# Patient Record
Sex: Male | Born: 1955 | Race: Black or African American | Hispanic: No | Marital: Single | Smoking: Former smoker
Health system: Southern US, Community
[De-identification: ages and names within clinical notes are randomized; demographics above are authoritative.]

## PROBLEM LIST (undated history)

## (undated) DIAGNOSIS — M51369 Other intervertebral disc degeneration, lumbar region without mention of lumbar back pain or lower extremity pain: Secondary | ICD-10-CM

## (undated) DIAGNOSIS — C189 Malignant neoplasm of colon, unspecified: Secondary | ICD-10-CM

## (undated) DIAGNOSIS — F10929 Alcohol use, unspecified with intoxication, unspecified: Secondary | ICD-10-CM

## (undated) DIAGNOSIS — M5416 Radiculopathy, lumbar region: Secondary | ICD-10-CM

## (undated) DIAGNOSIS — M5136 Other intervertebral disc degeneration, lumbar region: Secondary | ICD-10-CM

## (undated) HISTORY — DX: Radiculopathy, lumbar region: M54.16

## (undated) HISTORY — DX: Other intervertebral disc degeneration, lumbar region without mention of lumbar back pain or lower extremity pain: M51.369

## (undated) HISTORY — DX: Other intervertebral disc degeneration, lumbar region: M51.36

## (undated) HISTORY — DX: Malignant neoplasm of colon, unspecified: C18.9

## (undated) HISTORY — PX: OTHER SURGICAL HISTORY: SHX169

---

## 2005-08-06 ENCOUNTER — Emergency Department (HOSPITAL_COMMUNITY): Admission: EM | Admit: 2005-08-06 | Discharge: 2005-08-07 | Payer: Self-pay | Admitting: Emergency Medicine

## 2006-08-31 ENCOUNTER — Emergency Department (HOSPITAL_COMMUNITY): Admission: EM | Admit: 2006-08-31 | Discharge: 2006-09-01 | Payer: Self-pay | Admitting: *Deleted

## 2008-01-01 IMAGING — CT CT HEAD W/O CM
1 series · 16 of 30 positions shown, 20 images · IV contrast (agent unspecified)
Comparison: 08/07/05.

CLINICAL DATA: Intoxication and laceration. 
 HEAD CT WITHOUT CONTRAST:
TECHNIQUE: Contiguous axial images were obtained from the base of the skull through the vertex according to standard protocol without contrast.

[Series 2: head routine 4.8 h47s · axial · 0.47mm/px · z∈[-169,-39]mm · 16 of 30 slices shown, 20 images]
[im 2/30  brain]
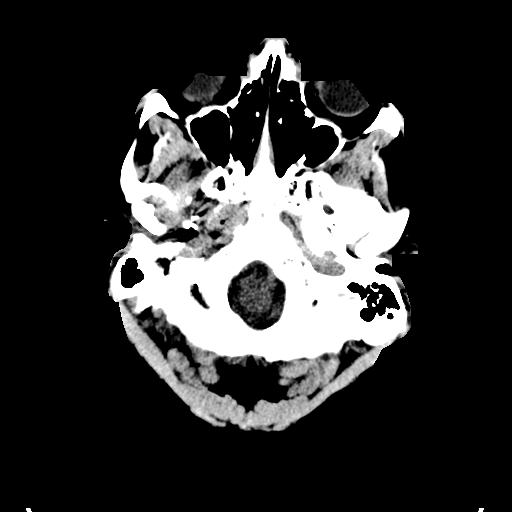
[im 2/30  bone]
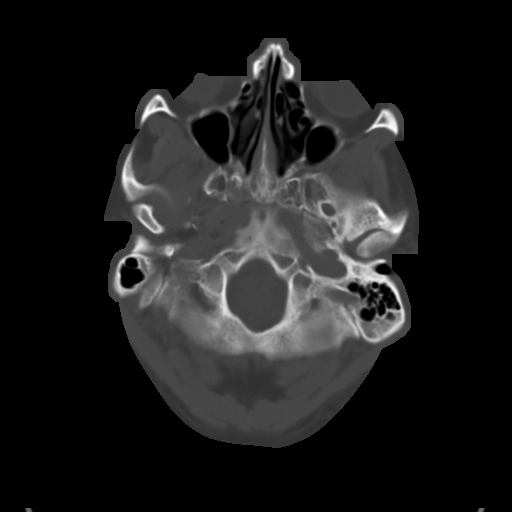
[im 4/30  brain]
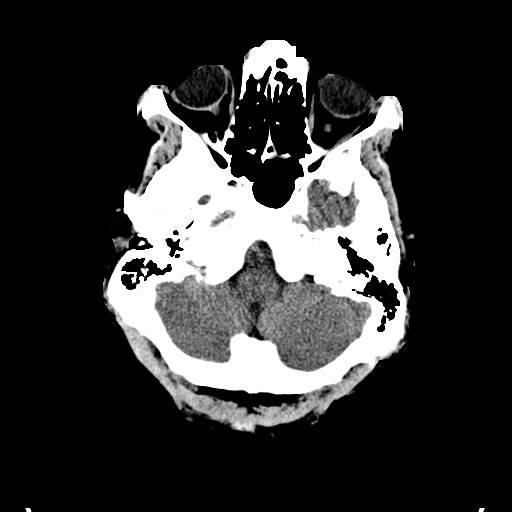
[im 6/30  brain]
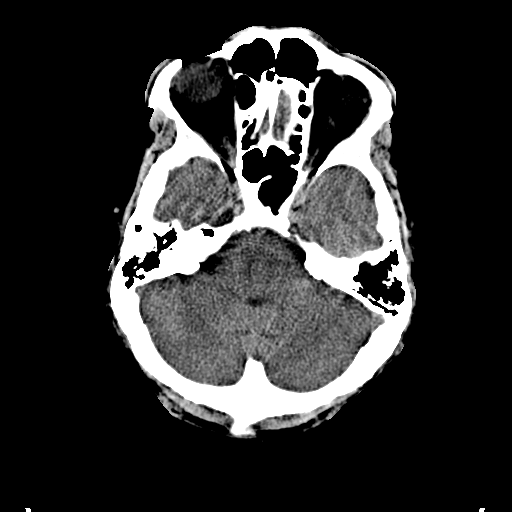
[im 8/30  brain]
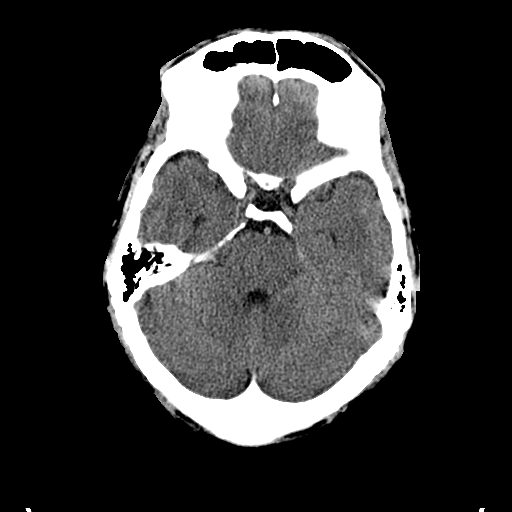
[im 9/30  brain]
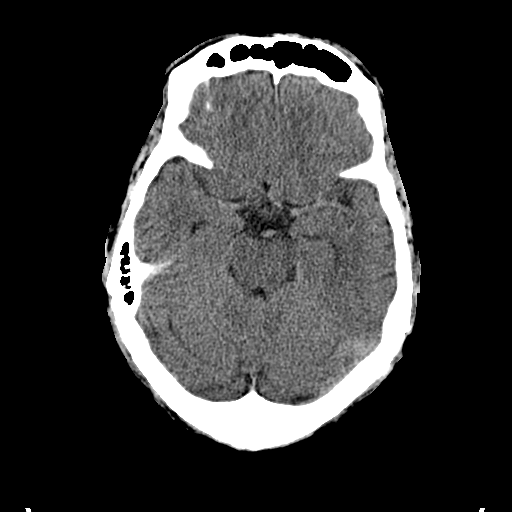
[im 9/30  bone]
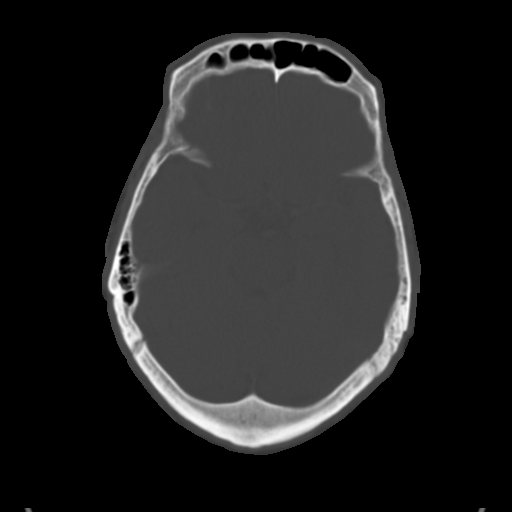
[im 11/30  brain]
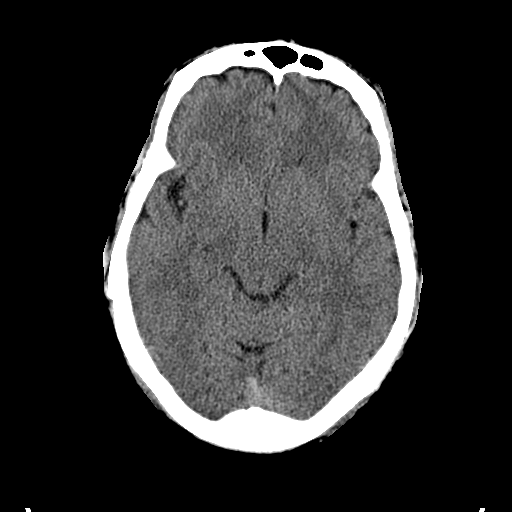
[im 13/30  brain]
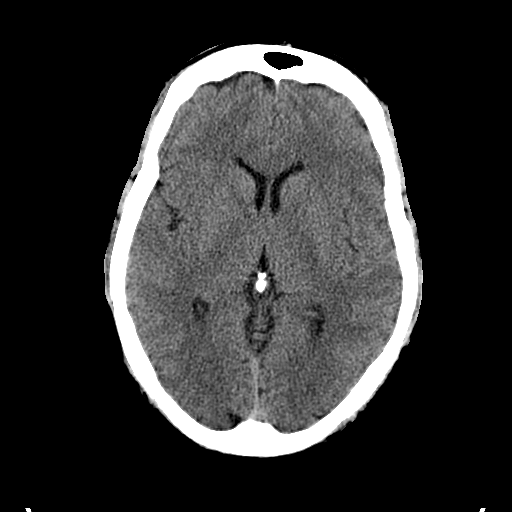
[im 15/30  brain]
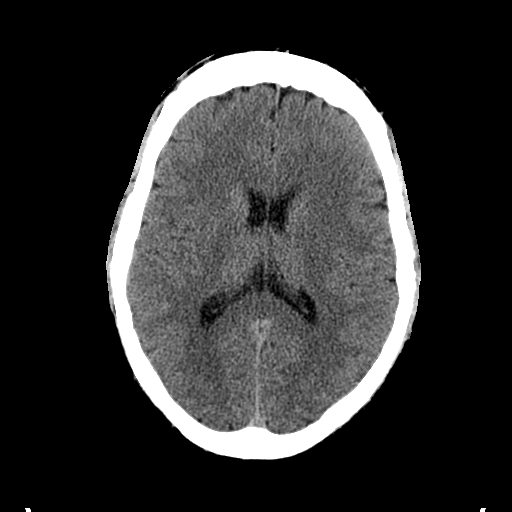
[im 16/30  brain]
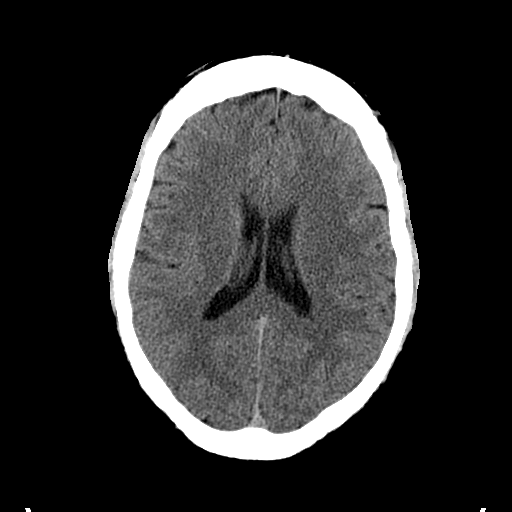
[im 16/30  bone]
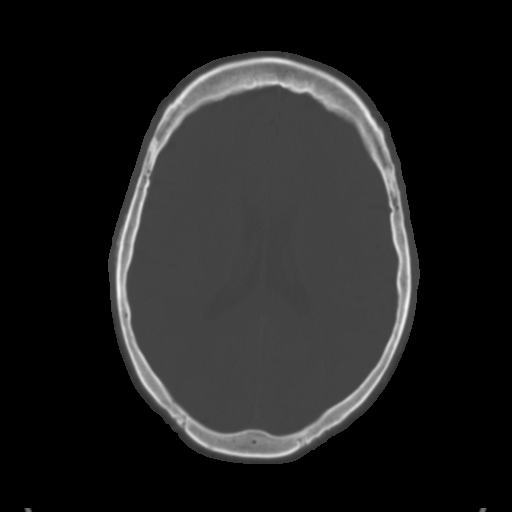
[im 18/30  brain]
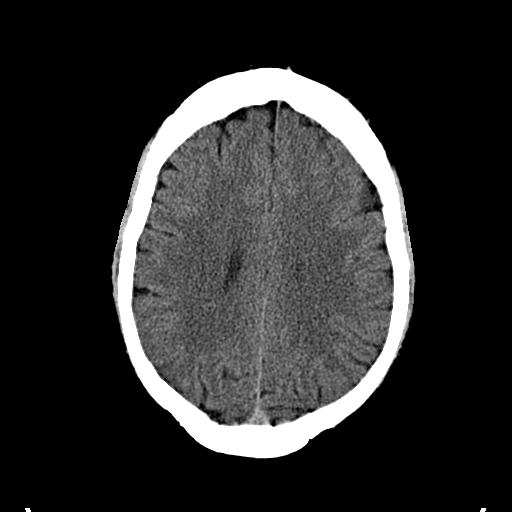
[im 20/30  brain]
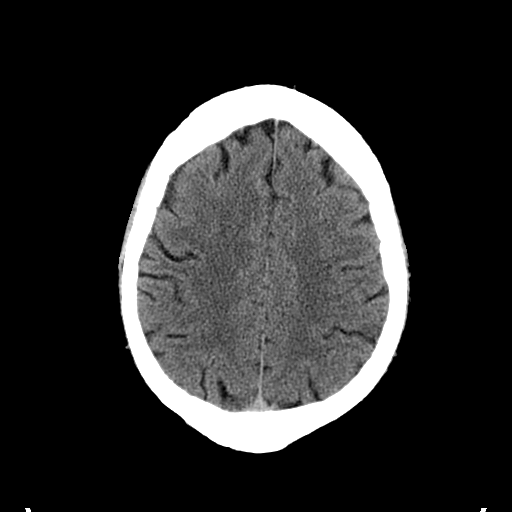
[im 22/30  brain]
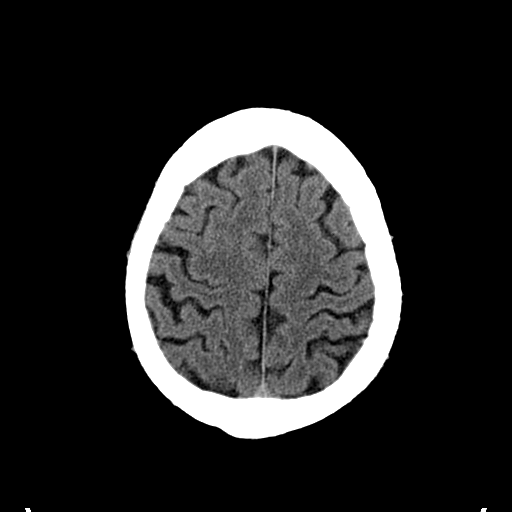
[im 23/30  brain]
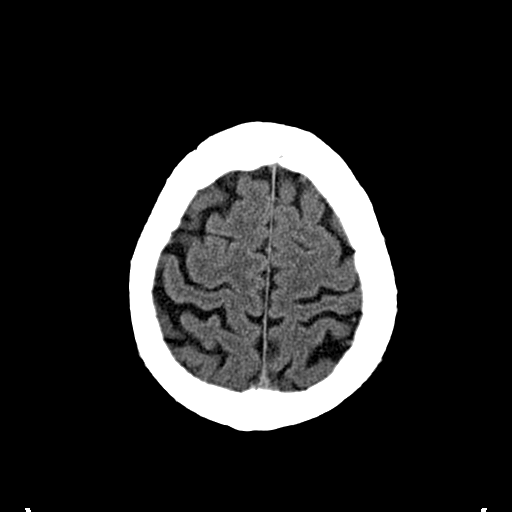
[im 23/30  bone]
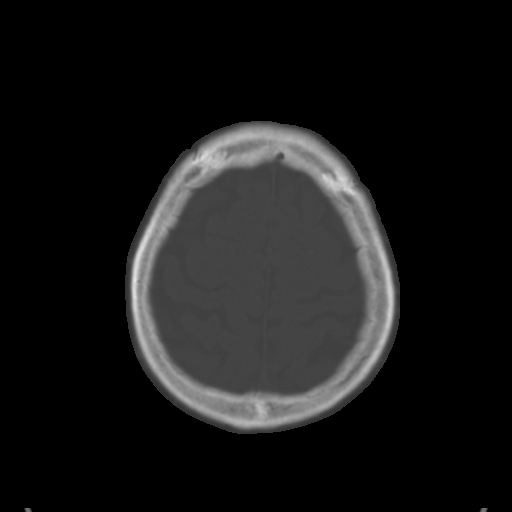
[im 25/30  brain]
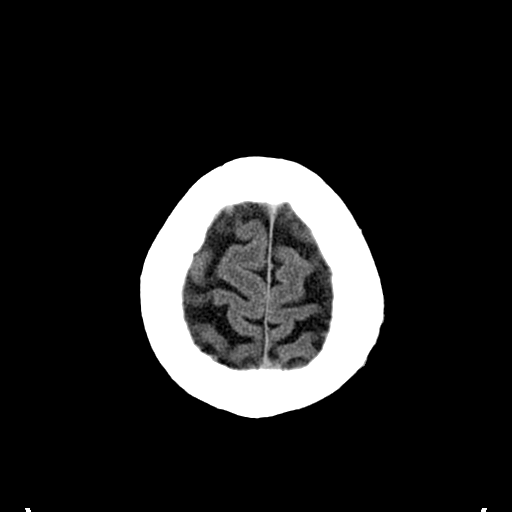
[im 27/30  brain]
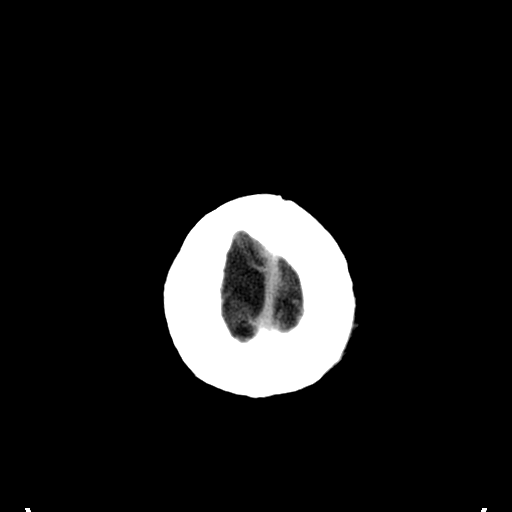
[im 29/30  brain]
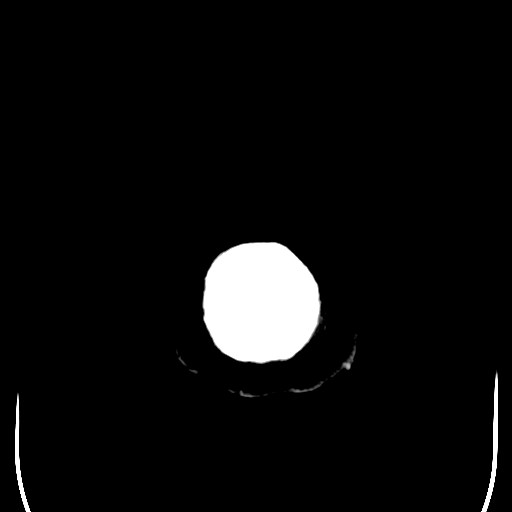

[16 of 30 positions shown; findings below may reference images not displayed]

FINDINGS: Intracranial structures are negative for hemorrhage, mass lesion, midline shift or hydrocephalus.  The ventricles and basilar cisterns are within normal limits.  Visualized sinuses are clear.  No acute bony abnormalities.
IMPRESSION: No acute intracranial abnormalities.

## 2018-05-23 ENCOUNTER — Emergency Department (HOSPITAL_COMMUNITY): Payer: Self-pay

## 2018-05-23 ENCOUNTER — Other Ambulatory Visit: Payer: Self-pay

## 2018-05-23 ENCOUNTER — Emergency Department (HOSPITAL_COMMUNITY)
Admission: EM | Admit: 2018-05-23 | Discharge: 2018-05-24 | Discharge status: Against Medical Advice | Payer: Self-pay | Attending: Emergency Medicine | Admitting: Emergency Medicine

## 2018-05-23 DIAGNOSIS — F10920 Alcohol use, unspecified with intoxication, uncomplicated: Secondary | ICD-10-CM | POA: Insufficient documentation

## 2018-05-23 DIAGNOSIS — W1830XA Fall on same level, unspecified, initial encounter: Secondary | ICD-10-CM | POA: Insufficient documentation

## 2018-05-23 DIAGNOSIS — M25551 Pain in right hip: Secondary | ICD-10-CM | POA: Insufficient documentation

## 2018-05-23 DIAGNOSIS — M79604 Pain in right leg: Secondary | ICD-10-CM

## 2018-05-23 NOTE — ED Notes (Signed)
Pt able to walk to restroom with 2 person assist.

## 2018-05-23 NOTE — ED Triage Notes (Signed)
Per EMS pt was picked up at a [employer] after the owner called [employer] because pt urinated inside the establishment. Per EMS pt c/o right leg/hip pain, sts was unable to walk. EMS sts [employer] reported pt was intoxicated, however pt denies drinking. Per EMS pt is agitated, angry, refused to answer their questions.

## 2018-05-23 NOTE — ED Notes (Signed)
Pt will not answer any questions and keeps shouting " My leg hurts." When left alone pt will go to sleep and start snoring.

## 2018-05-23 NOTE — ED Notes (Signed)
Pt was consolable after talking to him while getting vital signs. He was agitated that the police was called on him.

## 2018-05-23 NOTE — ED Notes (Signed)
Unable to obtain past medical history from pt, he does not want to answer questions. Pt only states that he is angry that police was called on him and that his leg is hurting.

## 2018-05-23 NOTE — ED Notes (Signed)
Pt able to ambulate unassisted from the restroom to his stretcher.

## 2018-05-24 MED ORDER — IBUPROFEN 200 MG PO TABS
600.0000 mg | ORAL_TABLET | Freq: Once | ORAL | Status: AC
  Administered 2018-05-24: 600 mg via ORAL
  Filled 2018-05-24: qty 3

## 2018-05-24 NOTE — ED Provider Notes (Signed)
[facility] DEPT Provider Note  CSN: [id number] Arrival date & time: 05/23/18 2147  Chief Complaint(s) Leg Pain  HPI Carl Barr is a 62 y.o. male who was brought in by EMS after being picked up at a restaurant for intoxication.  Patient at that time was found standing outside of the property.  [employer] was called the patient complained of right hip pain this EMS was contacted.  At that time patient was agitated and angry and refusing to answer questions.  Here he is complaining of right hip pain exacerbated with ambulation and range of motion.  Reports that he fell while getting down of the bus.  That occurred a few hours prior to being picked up.  Denies any head trauma or loss of consciousness.  Denied any other physical complaints including neck pain, back pain, upper extremity pain, chest pain, abdominal pain or left lower extremity pain.    HPI  Past Medical History No past medical history on file. There are no active problems to display for this patient.  Home Medication(s) Prior to Admission medications   Not on File                                                                                                                                    Past Surgical History ** The histories are not reviewed yet. Please review them in the "History" navigator section and refresh this [place]. Family History No family history on file.  Social History Social History   Tobacco Use  . Smoking status: Not on file  Substance Use Topics  . Alcohol use: Not on file  . Drug use: Not on file   Allergies Patient has no allergy information on record.  Review of Systems Review of Systems All other systems are reviewed and are negative for acute change except as noted in the HPI  Physical Exam Vital Signs  I have reviewed the triage vital signs BP (!) 149/66 (BP Location: Right Arm)   Pulse 91   Temp 98.1 F (36.7 C) (Oral)   Resp 16   SpO2 95%    Physical Exam  Constitutional: He is oriented to person, place, and time. He appears well-developed and well-nourished. No distress.  Clinically intoxicated  HENT:  Head: Normocephalic.  Right Ear: External ear normal.  Left Ear: External ear normal.  Mouth/Throat: Oropharynx is clear and moist.  Eyes: Pupils are equal, round, and reactive to light. Conjunctivae and EOM are normal. Right eye exhibits no discharge. Left eye exhibits no discharge. No scleral icterus.  Neck: Normal range of motion. Neck supple.  Cardiovascular: Regular rhythm and normal heart sounds. Exam reveals no gallop and no friction rub.  No murmur heard. Pulses:      Radial pulses are 2+ on the right side, and 2+ on the left side.       Dorsalis pedis pulses are 2+ on the right side,  and 2+ on the left side.  Pulmonary/Chest: Effort normal and breath sounds normal. No stridor. No respiratory distress.  Abdominal: Soft. He exhibits no distension. There is no tenderness.  Musculoskeletal:       Right hip: He exhibits tenderness. He exhibits normal range of motion, no swelling and no deformity.       Cervical back: He exhibits no bony tenderness.       Thoracic back: He exhibits no bony tenderness.       Lumbar back: He exhibits no bony tenderness.  Clavicle stable. Chest stable to AP/Lat compression. Pelvis stable to Lat compression. No obvious extremity deformity. No chest or abdominal wall contusion.  Neurological: He is alert and oriented to person, place, and time. GCS eye subscore is 4. GCS verbal subscore is 5. GCS motor subscore is 6.  Moving all extremities   Skin: Skin is warm. He is not diaphoretic.    ED Results and Treatments Labs (all labs ordered are listed, but only abnormal results are displayed) Labs Reviewed - No data to display                                                                                                                       EKG  EKG Interpretation  Date/Time:     Ventricular Rate:    PR Interval:    QRS Duration:   QT Interval:    QTC Calculation:   R Axis:     Text Interpretation:        Radiology Dg Hip Unilat W Or Wo Pelvis 1 View Right  Result Date: 05/23/2018 CLINICAL DATA:  Right hip pain EXAM: DG HIP (WITH OR WITHOUT PELVIS) 1V RIGHT COMPARISON:  None. FINDINGS: There is no evidence of hip fracture or dislocation. There is no evidence of arthropathy or other focal bone abnormality. IMPRESSION: No acute osseous injury of the right hip. Electronically Signed   By: Kathreen Devoid   On: 05/23/2018 23:47   Pertinent labs & imaging results that were available during my care of the patient were reviewed by me and considered in my medical decision making (see chart for details).  Medications Ordered in ED Medications  ibuprofen (ADVIL,MOTRIN) tablet 600 mg (600 mg Oral Given 05/24/18 0341)  Procedures Procedures  (including critical care time)  Medical Decision Making / ED Course I have reviewed the nursing notes for this encounter and the patient's prior records (if available in EHR or on provided paperwork).    Intoxicated patient endorsing right hip pain following a reported fall.  Plain film negative.  Patient ambulation antalgic but not unsteady.  Patient was allowed to metabolize to freedom.  After which she was able to ambulate unassisted.  The patient appears reasonably screened and/or stabilized for discharge and I doubt any other medical condition or other [facility] requiring further screening, evaluation, or treatment in the ED at this time prior to discharge.  The patient is safe for discharge with strict return precautions.  Final Clinical Impression(s) / ED Diagnoses Final diagnoses:  Right leg pain  Alcoholic intoxication without complication ([place])    Disposition: Discharge  Condition: Good  I  have discussed the results, Dx and Tx plan with the patient who expressed understanding and agree(s) with the plan. Discharge instructions discussed at great length. The patient was given strict return precautions who verbalized understanding of the instructions. No further questions at time of discharge.    ED Discharge Orders    None       Follow Up: Primary care provider  Schedule an appointment as soon as possible for a visit  If symptoms do not improve or  worsen     This chart was dictated using voice recognition software.  Despite best efforts to proofread,  errors can occur which can change the documentation meaning.   Fatima Blank, MD 05/24/18 0830

## 2018-05-24 NOTE — Discharge Instructions (Signed)
You may use over-the-counter Motrin (Ibuprofen), Acetaminophen (Tylenol), topical muscle creams such as SalonPas, [employer], Bengay, etc. Please stretch, apply ice or heat, whichever helps.

## 2018-05-28 ENCOUNTER — Other Ambulatory Visit: Payer: Self-pay

## 2018-05-28 ENCOUNTER — Emergency Department (HOSPITAL_COMMUNITY): Payer: Self-pay

## 2018-05-28 ENCOUNTER — Emergency Department (HOSPITAL_COMMUNITY)
Admission: EM | Admit: 2018-05-28 | Discharge: 2018-05-28 | Disposition: A | Discharge status: Home / Self Care | Payer: Self-pay | Attending: Emergency Medicine | Admitting: Emergency Medicine

## 2018-05-28 ENCOUNTER — Encounter (HOSPITAL_COMMUNITY): Payer: Self-pay

## 2018-05-28 DIAGNOSIS — F17200 Nicotine dependence, unspecified, uncomplicated: Secondary | ICD-10-CM | POA: Insufficient documentation

## 2018-05-28 DIAGNOSIS — M79604 Pain in right leg: Secondary | ICD-10-CM | POA: Insufficient documentation

## 2018-05-28 HISTORY — DX: Alcohol use, unspecified with intoxication, unspecified: F10.929

## 2018-05-28 MED ORDER — DICLOFENAC SODIUM 1 % TD GEL: 2.0000 g | Freq: Four times a day (QID) | TRANSDERMAL | 0 refills | Status: AC

## 2018-05-28 MED ORDER — HYDROCODONE-ACETAMINOPHEN 5-325 MG PO TABS
2.0000 | ORAL_TABLET | Freq: Once | ORAL | Status: AC
Start: 2018-05-28 — End: 2018-05-28
  Administered 2018-05-28: 2 via ORAL
  Filled 2018-05-28: qty 2

## 2018-05-28 MED ORDER — ACETAMINOPHEN 325 MG PO TABS: 650.0000 mg | ORAL_TABLET | Freq: Once | ORAL | Status: DC

## 2018-05-28 NOTE — ED Notes (Signed)
ED Provider at bedside. 

## 2018-05-28 NOTE — ED Notes (Signed)
Patient verbalizes understanding of discharge instructions. Opportunity for questioning and answers were provided. Armband removed by staff, pt discharged from ED home with family via [place].

## 2018-05-28 NOTE — ED Triage Notes (Signed)
Pt arrives ambulatory via POV for eval of ongoing RLE pain. Pt was seen on 11/16 at [facility] for same and ETOH intox. Pt was advised to f/u w/ PCP for pain but states he did not. Pt reports Pain radiates from buttock down entirety of RLE. Denies injury/trauma

## 2018-05-28 NOTE — ED Provider Notes (Signed)
[facility] EMERGENCY DEPARTMENT Provider Note   CSN: [id number] Arrival date & time: 05/28/18  1611     History   Chief Complaint Chief Complaint  Patient presents with  . Leg Pain    HPI Carl Barr is a 62 y.o. male past history of alcohol intoxication who presents for evaluation of persistent right lower extremity pain.  Patient reports that he was seen in the muscle in ED on 05/24/2018 for evaluation of right lower leg pain.  At the time, he had been intoxicated and had sustained a fall.  His x-ray was negative.  Patient was able to be discharged home.  He reports that since then, he has had pain to the right leg.  He states most of his pain is in the right hip and extends into the right thigh.  He states most of the tenderness is related into the mid right thigh area.  He denies any new trauma, fall, injury.  He reports he has been able to ambulate but does report worsening pain with ambulation.  He has not been taking any medications for the pain.  Patient denies any back pain, overlying warmth, erythema, numbness/weakness of the leg, saddle anesthesia, urinary incontinence.  The history is provided by the patient.    Past Medical History:  Diagnosis Date  . Alcohol intoxication ([place])     There are no active problems to display for this patient.   History reviewed. No pertinent surgical history.      Home Medications    Prior to Admission medications   Medication Sig Start Date End Date Taking? Authorizing Provider  diclofenac sodium (VOLTAREN) 1 % GEL Apply 2 g topically 4 (four) times daily for 13 days. 05/28/18 06/10/18  Volanda Napoleon, PA-C    Family History History reviewed. No pertinent family history.  Social History Social History   Tobacco Use  . Smoking status: Current Every Day Smoker  . Smokeless tobacco: Never Used  Substance Use Topics  . Alcohol use: Yes    Comment: "alot"  . Drug use: Not Currently     Allergies    Patient has no known allergies.   Review of Systems Review of Systems  Musculoskeletal: Negative for back pain.       Right leg pain  Skin: Negative for color change.  Neurological: Negative for weakness and numbness.     Physical Exam Updated Vital Signs BP (!) 158/91   Pulse 86   Temp 98.6 F (37 C) (Oral)   Resp 16   Ht 5\' 9"  (1.753 m)   Wt 99.8 kg   SpO2 99%   BMI 32.49 kg/m   Physical Exam  Constitutional: He appears well-developed and well-nourished.  HENT:  Head: Normocephalic and atraumatic.  Eyes: Conjunctivae and EOM are normal. Right eye exhibits no discharge. Left eye exhibits no discharge. No scleral icterus.  Cardiovascular:  Pulses:      Radial pulses are 2+ on the right side, and 2+ on the left side.       Dorsalis pedis pulses are 2+ on the right side, and 2+ on the left side.  Pulmonary/Chest: Effort normal.  Musculoskeletal:       Legs: Diffuse muscular tenderness noted to the right lower extremity at the mid thigh area.  Extends superiorly up into the right hip.  No deformity or crepitus noted to the actual right hip bone.  Flexion/extension of right lower extremity intact.  Internal and external rotation able to be  accomplished without any difficulty.  Patient able to raise his leg but does report worsening pain in the thigh area when raising it.  He is able to hold it up against gravity.  No tenderness palpation noted right knee, right tib-fib.  No tenderness palpation on left lower extremity.  Bilateral lower extremities are symmetric in appearance without any overlying warmth, erythema, edema.  Neurological: He is alert.  Follows commands, Moves all extremities  5/5 strength to BUE and BLE  Sensation intact throughout all major nerve distributions  Skin: Skin is warm and dry. Capillary refill takes less than 2 seconds.  Good distal cap refill.  RLE is not dusky in appearance or cool to touch.  Psychiatric: He has a normal mood and affect. His  speech is normal and behavior is normal.  Nursing note and vitals reviewed.    ED Treatments / Results  Labs (all labs ordered are listed, but only abnormal results are displayed) Labs Reviewed - No data to display  EKG None  Radiology Dg Hip Unilat W Or Wo Pelvis 2-3 Views Right  Result Date: 05/28/2018 CLINICAL DATA:  Patient stepped off the bus 1 week ago and his right leg gave out resulting in a fall. Patient complains of joint pain radiating down right leg. 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 There appears to be lumbosacral transitional vertebral anatomy at the lumbosacral junction. The bony pelvis appears intact. No acute fracture of either hip. No pelvic diastasis. No flattening of the femoral heads. IMPRESSION: No acute osseous abnormality of the bony pelvis and right hip. 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 Electronically Signed   By: Ashley Royalty M.D.   On: 05/28/2018 19:56    Procedures Procedures (including critical care time)  Medications Ordered in ED Medications  acetaminophen (TYLENOL) tablet 650 mg (has no administration in time range)  HYDROcodone-acetaminophen (NORCO/VICODIN) 5-325 MG per tablet 2 tablet (2 tablets Oral Given 05/28/18 1657)     Initial Impression / Assessment and Plan / ED Course  I have reviewed the triage vital signs and the nursing notes.  Pertinent labs & imaging results that were available during my care of the patient were reviewed by me and considered in my medical decision making (see chart for details).     62 year old male who presents for evaluation of right lower extremity pain.  Has been ongoing for the last several days.  Was seen at [facility] for evaluation of pain.  At that time, he was intoxicated and had sustained a fall.  X-rays were normal patient was  discharged home.  Since then has had continued pain to the right lower extremity.  No new trauma, injury, fall.  He has been able to ambulate but does report worsening pain with ambulation.  No overlying warmth, erythema, numbness/weakness. Patient is afebrile, non-toxic appearing, sitting comfortably on examination table. Vital signs reviewed and stable.  Exam, patient has diffuse muscular tenderness noted to the right thigh that extends superiorly up into her abdomen.  Flexion/extension intact.  He does have some pain with lifting up the leg but is able to hold it against resistance.  Normal strength.  Consider musculoskeletal pain versus hip fracture low suspicion given history/physical exam.  History/physical exam is not concerning for cauda equina, septic arthritis, acute arterial embolism, DVT.  X-ray reviewed.  Negative for any acute bony abnormality.  Patient has been able to ambulate in the ED to the bathroom without any difficulty.  Discussed results with patient.  I explained that he most likely has some musculoskeletal pain and to treated conservatively at home with supportive care measures.  Patient does not have a primary care doctor to follow-up with.  Encouraged patient to follow-up with [facility]. Patient had ample opportunity for questions and discussion. All patient's questions were answered with full understanding. Strict return precautions discussed. Patient expresses understanding and agreement to plan.   Final Clinical Impressions(s) / ED Diagnoses   Final diagnoses:  Right leg pain    ED Discharge Orders         Ordered    diclofenac sodium (VOLTAREN) 1 % GEL  4 times daily     05/28/18 2013           Desma Mcgregor 05/28/18 2316    Blanchie Dessert, MD 05/29/18 1108

## 2018-05-28 NOTE — Discharge Instructions (Signed)
You can take Tylenol or Ibuprofen as directed for pain.   Use Voltaren gel as directed.  Apply ice to help with the pain.  Follow up with the referred [facility] to establish a primary care doctor.  Return the emergency department for any worsening pain, redness or swelling of the leg, numbness in the leg, difficulty walking or any other worsening or concerning symptoms.

## 2018-06-16 NOTE — Progress Notes (Deleted)
Patient ID: Carl Barr, male   DOB: 10-10-1955, 62 y.o.   MRN: [id number]  After being seen in the ED 05/28/2018.  From hospital notes: Harbert Fitterer is a 62 y.o. male past history of alcohol intoxication who presents for evaluation of persistent right lower extremity pain.  Patient reports that he was seen in the muscle in ED on 05/24/2018 for evaluation of right lower leg pain.  At the time, he had been intoxicated and had sustained a fall.  His x-ray was negative.  Patient was able to be discharged home.  He reports that since then, he has had pain to the right leg.  He states most of his pain is in the right hip and extends into the right thigh.  He states most of the tenderness is related into the mid right thigh area.  He denies any new trauma, fall, injury.  He reports he has been able to ambulate but does report worsening pain with ambulation.  He has not been taking any medications for the pain.  Patient denies any back pain, overlying warmth, erythema, numbness/weakness of the leg, saddle anesthesia, urinary incontinence.  From A/P: 63 year old male who presents for evaluation of right lower extremity pain.  Has been ongoing for the last several days.  Was seen at [facility] for evaluation of pain.  At that time, he was intoxicated and had sustained a fall.  X-rays were normal patient was discharged home.  Since then has had continued pain to the right lower extremity.  No new trauma, injury, fall.  He has been able to ambulate but does report worsening pain with ambulation.  No overlying warmth, erythema, numbness/weakness. Patient is afebrile, non-toxic appearing, sitting comfortably on examination table. Vital signs reviewed and stable.  Exam, patient has diffuse muscular tenderness noted to the right thigh that extends superiorly up into her abdomen.  Flexion/extension intact.  He does have some pain with lifting up the leg but is able to hold it against resistance.  Normal strength.   Consider musculoskeletal pain versus hip fracture low suspicion given history/physical exam.  History/physical exam is not concerning for cauda equina, septic arthritis, acute arterial embolism, DVT.  X-ray reviewed.  Negative for any acute bony abnormality.  Patient has been able to ambulate in the ED to the bathroom without any difficulty.  Discussed results with patient.  I explained that he most likely has some musculoskeletal pain and to treated conservatively at home with supportive care measures.  Patient does not have a primary care doctor to follow-up with.  Encouraged patient to follow-up with [facility]. Patient had ample opportunity for questions and discussion. All patient's questions were answered with full understanding. Strict return precautions discussed. Patient expresses understanding and agreement to plan.

## 2018-06-17 ENCOUNTER — Encounter: Payer: Self-pay | Admitting: General Practice

## 2018-06-17 ENCOUNTER — Ambulatory Visit: Payer: Self-pay | Attending: Family Medicine | Admitting: Physician Assistant

## 2018-06-17 ENCOUNTER — Inpatient Hospital Stay: Payer: Self-pay

## 2018-06-17 VITALS — BP 138/96 | HR 96 | Temp 98.1°F | Resp 16 | Wt 230.6 lb

## 2018-06-17 DIAGNOSIS — M5431 Sciatica, right side: Secondary | ICD-10-CM | POA: Insufficient documentation

## 2018-06-17 DIAGNOSIS — Z09 Encounter for follow-up examination after completed treatment for conditions other than malignant neoplasm: Secondary | ICD-10-CM

## 2018-06-17 DIAGNOSIS — R030 Elevated blood-pressure reading, without diagnosis of hypertension: Secondary | ICD-10-CM | POA: Insufficient documentation

## 2018-06-17 MED ORDER — METHOCARBAMOL 500 MG PO TABS: 500.0000 mg | ORAL_TABLET | Freq: Three times a day (TID) | ORAL | 0 refills | Status: DC

## 2018-06-17 MED ORDER — NAPROXEN 500 MG PO TABS: 500.0000 mg | ORAL_TABLET | Freq: Two times a day (BID) | ORAL | 0 refills | Status: DC

## 2018-06-17 MED FILL — METHOCARBAMOL 500 MG TABS: 500 | 30 days supply | Qty: 90 | Fill #0

## 2018-06-17 MED FILL — NAPROXEN 500 MG TABLET: 500 | 30 days supply | Qty: 60 | Fill #0

## 2018-06-17 NOTE — Progress Notes (Signed)
Patient ID: Carl Barr, male   DOB: 03-20-1956, 62 y.o.   MRN: [id number]     Carl Barr, is a 62 y.o. male  LHT:[id number]  [id number]  DOB - 04/29/1956  Subjective:  Chief Complaint and HPI: Carl Barr is a 62 y.o. male here today to establish care and for a follow up visit After being seen in the ED 05/24/2018.  He still c/o R leg pain that radiates from about his R S-I joint down his R leg.  Not taking anything for pain.  Has been OOW since 11/16 and brought FMLA papers.  No numbness/weakness.    From Ed note: 62 year old male who presents for evaluation of right lower extremity pain.  Has been ongoing for the last several days.  Was seen at [facility] for evaluation of pain.  At that time, he was intoxicated and had sustained a fall.  X-rays were normal patient was discharged home.  Since then has had continued pain to the right lower extremity.  No new trauma, injury, fall.  He has been able to ambulate but does report worsening pain with ambulation.  No overlying warmth, erythema, numbness/weakness. Patient is afebrile, non-toxic appearing, sitting comfortably on examination table. Vital signs reviewed and stable.  Exam, patient has diffuse muscular tenderness noted to the right thigh that extends superiorly up into her abdomen.  Flexion/extension intact.  He does have some pain with lifting up the leg but is able to hold it against resistance.  Normal strength.  Consider musculoskeletal pain versus hip fracture low suspicion given history/physical exam.  History/physical exam is not concerning for cauda equina, septic arthritis, acute arterial embolism, DVT.  X-ray reviewed.  Negative for any acute bony abnormality.  Patient has been able to ambulate in the ED to the bathroom without any difficulty.  Discussed results with patient.  I explained that he most likely has some musculoskeletal pain and to treated conservatively at home with supportive care measures.  Patient does  not have a primary care doctor to follow-up with.  Encouraged patient to follow-up with [facility]. Patient had ample opportunity for questions and discussion. All patient's questions were answered with full understanding. Strict return precautions discussed. Patient expresses understanding and agreement to plan.   ED/Hospital notes reviewed and summarized above  ROS:   Constitutional:  No f/c, No night sweats, No unexplained weight loss. EENT:  No vision changes, No blurry vision, No hearing changes. No mouth, throat, or ear problems.  Respiratory: No cough, No SOB Cardiac: No CP, no palpitations GI:  No abd pain, No N/V/D. GU: No Urinary s/sx Musculoskeletal: R S-I and R leg pain Neuro: No headache, no dizziness, no motor weakness.  Skin: No rash Endocrine:  No polydipsia. No polyuria.  Psych: Denies SI/HI  No problems updated.  ALLERGIES: No Known Allergies  PAST MEDICAL HISTORY: Past Medical History:  Diagnosis Date  . Alcohol intoxication ([place])     MEDICATIONS AT HOME: Prior to Admission medications   Medication Sig Start Date End Date Taking? Authorizing Provider  methocarbamol (ROBAXIN) 500 MG tablet Take 1 tablet (500 mg total) by mouth 3 (three) times daily. X 10 days then prn spasm 06/17/18   Freeman Caldron M, PA-C  naproxen (NAPROSYN) 500 MG tablet Take 1 tablet (500 mg total) by mouth 2 (two) times daily with a meal. X 10 days then prn 06/17/18   Argentina Donovan, PA-C     Objective:  EXAM:   Vitals:   06/17/18 1550  BP: (!) 138/96  Pulse: 96  Resp: 16  Temp: 98.1 F (36.7 C)  TempSrc: Oral  SpO2: 98%  Weight: 230 lb 9.6 oz (104.6 kg)    General appearance : A&OX3. NAD. Non-toxic-appearing, ambulates without difficulty.   HEENT: Atraumatic and Normocephalic.  PERRLA. EOM intact. Neck: supple, no JVD. No cervical lymphadenopathy. No thyromegaly Chest/Lungs:  Breathing-non-labored, Good air entry bilaterally, breath sounds normal without  rales, rhonchi, or wheezing  CVS: S1 S2 regular, no murmurs, gallops, rubs  Back:  No bony TTP.  R hip with 80% ROM.  +TTP over R S-I joint.  +paraspinus spasm in the R lumbar region.  LE DTR1+=B.   Extremities: Bilateral Lower Ext shows no edema, both legs are warm to touch with = pulse throughout Neurology:  CN II-XII grossly intact, Non focal.   Psych:  TP linear. J/I WNL. Normal speech. Appropriate eye contact and affect.  Skin:  No Rash  Data Review No results found for: HGBA1C   Assessment & Plan   1. Sciatica of right side No red flags - naproxen (NAPROSYN) 500 MG tablet; Take 1 tablet (500 mg total) by mouth 2 (two) times daily with a meal. X 10 days then prn  Dispense: 60 tablet; Refill: 0 - methocarbamol (ROBAXIN) 500 MG tablet; Take 1 tablet (500 mg total) by mouth 3 (three) times daily. X 10 days then prn spasm  Dispense: 90 tablet; Refill: 0  2. Elevated BP without diagnosis of hypertension Check BP OOO 3-5 times/week and record and bring to next visit  3. Encounter for examination following treatment at hospital FMLA papers filled out from initial visit to in ED until Monday.     Patient have been counseled extensively about nutrition and exercise  Return in about 3 weeks (around 07/08/2018) for assign PCP-recheck sciatica.  The patient was given clear instructions to go to ER or return to medical center if symptoms don't improve, worsen or new problems develop. The patient verbalized understanding. The patient was told to call to get lab results if they haven't heard anything in the next week.     Freeman Caldron, PA-C [facility] and [facility] [place], [place]   06/17/2018, 4:06 PM

## 2018-07-10 ENCOUNTER — Ambulatory Visit: Payer: BLUE CROSS/BLUE SHIELD | Attending: Family Medicine | Admitting: Family Medicine

## 2018-07-10 ENCOUNTER — Encounter: Payer: Self-pay | Admitting: Family Medicine

## 2018-07-10 VITALS — BP 131/79 | HR 93 | Temp 98.5°F | Resp 18 | Ht 68.0 in | Wt 235.0 lb

## 2018-07-10 DIAGNOSIS — M5416 Radiculopathy, lumbar region: Secondary | ICD-10-CM | POA: Diagnosis not present

## 2018-07-10 DIAGNOSIS — M5136 Other intervertebral disc degeneration, lumbar region: Secondary | ICD-10-CM

## 2018-07-10 DIAGNOSIS — M51369 Other intervertebral disc degeneration, lumbar region without mention of lumbar back pain or lower extremity pain: Secondary | ICD-10-CM

## 2018-07-10 NOTE — Progress Notes (Signed)
Subjective:    Patient ID: Carl Barr, male    DOB: 06-08-1956, 63 y.o.   MRN: [id number]  HPI        63 yo male new to me as a patient who was seen in the office on 05/28/18 by another provider in follow-up of an ED visit on 05/28/18 as well as on 05/23/18 for right leg/hip pain status post falling while getting off of a bus.  At his recent office visit, patient was given diagnosis of sciatica with prescriptions for naproxen and Robaxin being sent into his pharmacy.      Patient reports that he was getting off of a bus and had sudden onset of right leg pain and patient fell on the ground.  Patient states that he was able to get up eventually and went to a nearby business so that he could sit down and patient states that even though he told the owner that he was having back and leg pain and had just fallen recently, the business owner called the police.  Patient states that when he told the police about his fall and his back, hip and leg pain EMS was called and he was transported to the ED. patient states that he was told that the x-ray of his hip was normal.  Patient reports that he continued to have pain in his hip as well as sensation of pain in his right thigh and returned to the emergency department.       Patient reports that since his recent visit here he has felt better and has had decrease pain in his right hip and leg while taking the medications prescribed.  Patient however still continues to have sensation of pain in his right hip/thigh that is worse with walking.  Patient reports that he has paperwork given to him at his workplace that needs to be completed.  Patient reports that he has not returned to work since his fall.  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  Patient continues to have pain but believes that he would be able  to perform his job duties as he does not have to move around a lot in his job but stands mostly in the same area.  Patient thinks that the pain in his right hip and thigh is now better than it has been since his fall.  Pain is now mostly dull and aching (patient could not seem to grasp instructions regarding quantifying pain on a 0-to-10 scale and therefore never gave an answer regarding his pain level based on a pain scale of 0-10.).  Pain is worse with walking or initially getting up after being seated.  Pain is now mostly just a dull, aching sensation mostly in his right lower back/buttock and hip area.   . Past Medical History:  Diagnosis Date  . Alcohol intoxication ([place])    Social History   Tobacco Use  . Smoking status: Current Every Day Smoker  . Smokeless tobacco: Never Used  . Tobacco comment: Patient DENIES 07/10/2018  Substance Use Topics  . Alcohol use: Yes    Comment: "alot"- patient DENIES 07/10/2018  . Drug use: Not Currently  No Known Allergies   Review of Systems  Constitutional: Positive for fatigue. Negative for chills and fever.  HENT: Negative for sore throat and trouble  swallowing.   Respiratory: Negative for cough and shortness of breath.   Cardiovascular: Negative for chest pain, palpitations and leg swelling.  Gastrointestinal: Negative for abdominal pain, constipation and diarrhea.  Endocrine: Negative for cold intolerance, heat intolerance, polydipsia, polyphagia and polyuria.  Genitourinary: Negative for dysuria, flank pain and frequency.  Musculoskeletal: Positive for back pain and gait problem.  Neurological: Negative for dizziness and headaches.  Hematological: Negative for adenopathy. Does not bruise/bleed easily.       Objective:   Physical Exam BP 131/79 (BP Location: Left Arm, Patient Position: Sitting, Cuff Size: Normal)   Pulse 93   Temp 98.5 F (36.9 C) (Oral)   Resp 18   Ht 5\' 8"  (1.727 m)   Wt 235 lb (106.6 kg)   SpO2 97%   BMI 35.73  kg/m  Nurse's notes and vital signs reviewed  General- well-nourished, well-developed, overweight for height/obese older male in no acute distress Lungs-clear to auscultation bilaterally, breathing is unlabored Cardiovascular-regular rate and regular rhythm Abdomen-truncal obesity, soft and nontender Back/musculoskeletal-no CVA tenderness.  Patient with thoracolumbar paraspinous spasm-mild.  Patient with some mild discomfort over the right SI joint and patient with complaint of lower back discomfort with seated leg raise right greater than left.  Patient denies any increased pain into the right thigh/leg with seated leg raise at today's visit.  Patient is able to lift his thigh off of the table/flexion of the thigh without complaint of increased pain.  Patient does not have any increased pain with internal or external rotation of the hip       Assessment & Plan:  1. Lumbar degenerative disc disease Patient had recent x-rays which showed evidence of lumbar degenerative disc disease.  Patient may continue use of Naprosyn or over-the-counter naproxen once his prescription has finished as needed for pain.  Patient may also continue his use of Robaxin.  Patient has minimal pain at today's visit.  Patient's paperwork was filled out indicating that it was okay for patient to return to work and that patient had been out of work since his fall on 05/23/2018 at which time his initial increased back and hip pain started - AMB referral to orthopedics  2. Lumbar radiculopathy, right Patient reports improvement in pain but was not able to quantify his pain level at today's visit. Patient will be referred to Orthopedics for further follow-up as he is likely to have future issues with pain and falls. - AMB referral to orthopedics  An After Visit Summary was printed and given to the patient.  Return if symptoms worsen or fail to improve.

## 2018-07-10 NOTE — Patient Instructions (Signed)
Lumbosacral Radiculopathy Lumbosacral radiculopathy is a condition that involves the spinal nerves and nerve roots in the low back and bottom of the spine. The condition develops when these nerves and nerve roots move out of place or become inflamed and cause symptoms. What are the causes? This condition may be caused by:  Pressure from a disk that bulges out of place (herniated disk). A disk is a plate of soft cartilage that separates bones in the spine.  Disk changes that occur with age (disk degeneration).  A narrowing of the bones of the lower back (spinal stenosis).  A tumor.  An infection.  An injury that places sudden pressure on the disks that cushion the bones of your lower spine. What increases the risk? You are more likely to develop this condition if:  You are a male who is 30-50 years old.  You are a male who is 50-60 years old.  You use improper technique when lifting things.  You are overweight or live a sedentary lifestyle.  Your work requires frequent lifting.  You smoke.  You do repetitive activities that strain the spine. What are the signs or symptoms? Symptoms of this condition include:  Pain that goes down from your back into your legs (sciatica), usually on one side of the body. This is the most common symptom. The pain may be worse with sitting, coughing, or sneezing.  Pain and numbness in your legs.  Muscle weakness.  Tingling.  Loss of bladder control or bowel control. How is this diagnosed? This condition may be diagnosed based on:  Your symptoms and medical history.  A physical exam. If the pain is lasting, you may have tests, such as:  MRI scan.  X-ray.  CT scan.  A type of X-ray used to examine the spinal canal after injecting a dye into your spine (myelogram).  A test to measure how electrical impulses move through a nerve (nerve conduction study). How is this treated? Treatment may depend on the cause of the condition and  may include:  Working with a physical therapist.  Taking pain medicine.  Applying heat and ice to affected areas.  Doing stretches to improve flexibility.  Doing exercises to strengthen back muscles.  Having chiropractic spinal manipulation.  Using transcutaneous electrical nerve stimulation (TENS) therapy.  Getting a steroid injection in the spine. In some cases, no treatment is needed. If the condition is long-lasting (chronic), or if symptoms are severe, treatment may involve surgery or lifestyle changes, such as following a weight-loss plan. Follow these instructions at home: Activity  Avoid bending and other activities that make the problem worse.  Maintain a proper position when standing or sitting: ? When standing, keep your upper back and neck straight, with your shoulders pulled back. Avoid slouching. ? When sitting, keep your back straight and relax your shoulders. Do not round your shoulders or pull them backward.  Do not sit or stand in one place for long periods of time.  Take brief periods of rest throughout the day. This will reduce your pain. It is usually better to rest by lying down or standing, not sitting.  When you are resting for longer periods, mix in some mild activity or stretching between periods of rest. This will help to prevent stiffness and pain.  Get regular exercise. Ask your health care provider what activities are safe for you. If you were shown how to do any exercises or stretches, do them as directed by your health care provider.  Do   in some mild activity or stretching between periods of rest. This will help to prevent stiffness and pain.  · Get regular exercise. Ask your health care provider what activities are safe for you. If you were shown how to do any exercises or stretches, do them as directed by your health care provider.  · Do not lift anything that is heavier than 10 lb (4.5 kg) or the limit that you are told by your health care provider. Always use proper lifting technique, which includes:  ? Bending your knees.  ? Keeping the load close to your body.  ? Avoiding twisting.  Managing pain  · If directed, put ice on the affected area:  ? Put ice in a plastic bag.  ? Place a towel between your skin and the bag.  ? Leave the ice on for 20  minutes, 2-3 times a day.  · If directed, apply heat to the affected area as often as told by your health care provider. Use the heat source that your health care provider recommends, such as a moist heat pack or a heating pad.  ? Place a towel between your skin and the heat source.  ? Leave the heat on for 20-30 minutes.  ? Remove the heat if your skin turns bright red. This is especially important if you are unable to feel pain, heat, or cold. You may have a greater risk of getting burned.  · Take over-the-counter and prescription medicines only as told by your health care provider.  General instructions  · Sleep on a firm mattress in a comfortable position. Try lying on your side with your knees slightly bent. If you lie on your back, put a pillow under your knees.  · Do not drive or use heavy machinery while taking prescription pain medicine.  · If your health care provider prescribed a diet or exercise program, follow it as directed.  · Keep all follow-up visits as told by your health care provider. This is important.  Contact a health care provider if:  · Your pain does not improve over time, even when taking pain medicines.  Get help right away if:  · You develop severe pain.  · Your pain suddenly gets worse.  · You develop increasing weakness in your legs.  · You lose the ability to control your bladder or bowel.  · You have difficulty walking or balancing.  · You have a fever.  Summary  · Lumbosacral radiculopathy is a condition that occurs when the spinal nerves and nerve roots in the lower part of the spine move out of place or become inflamed and cause symptoms.  · Symptoms include pain, numbness, and tingling that go down from your back into your legs (sciatica), muscle weakness, and loss of bladder control or bowel control.  · If directed, apply ice or heat to the affected area as told by your health care provider.  · Follow instructions about activity, rest, and proper lifting technique.  This  information is not intended to replace advice given to you by your health care provider. Make sure you discuss any questions you have with your health care provider.  Document Released: 06/24/2005 Document Revised: 06/12/2017 Document Reviewed: 06/12/2017  Elsevier Interactive Patient Education © 2019 [employer]

## 2018-08-09 ENCOUNTER — Encounter: Payer: Self-pay | Admitting: Family Medicine

## 2018-09-09 ENCOUNTER — Ambulatory Visit: Payer: BLUE CROSS/BLUE SHIELD

## 2018-09-10 ENCOUNTER — Ambulatory Visit: Payer: BLUE CROSS/BLUE SHIELD | Attending: Family Medicine | Admitting: Physician Assistant

## 2018-09-10 VITALS — BP 127/81 | HR 94 | Temp 98.2°F | Ht 68.0 in | Wt 229.4 lb

## 2018-09-10 DIAGNOSIS — Z131 Encounter for screening for diabetes mellitus: Secondary | ICD-10-CM | POA: Diagnosis not present

## 2018-09-10 DIAGNOSIS — M5431 Sciatica, right side: Secondary | ICD-10-CM | POA: Diagnosis not present

## 2018-09-10 LAB — GLUCOSE, POCT (MANUAL RESULT ENTRY): POC Glucose: 98 mg/dl (ref 70–99)

## 2018-09-10 MED ORDER — NAPROXEN 500 MG PO TABS: 500.0000 mg | ORAL_TABLET | Freq: Two times a day (BID) | ORAL | 2 refills | Status: DC

## 2018-09-10 MED ORDER — METHOCARBAMOL 500 MG PO TABS: ORAL_TABLET | ORAL | 2 refills | Status: DC

## 2018-09-10 MED FILL — METHOCARBAMOL 500 MG TABS: 500 | 10 days supply | Qty: 60 | Fill #0

## 2018-09-10 MED FILL — NAPROXEN 500 MG TABLET: 500 | 30 days supply | Qty: 60 | Fill #0

## 2018-09-10 NOTE — Patient Instructions (Signed)
Sciatica    Sciatica is pain, numbness, weakness, or tingling along your sciatic nerve. The sciatic nerve starts in the lower back and goes down the back of each leg. Sciatica happens when this nerve is pinched or has pressure put on it. Sciatica usually goes away on its own or with treatment. Sometimes, sciatica may keep coming back (recur).  Follow these instructions at home:  Medicines  · Take over-the-counter and prescription medicines only as told by your doctor.  · Do not drive or use heavy machinery while taking prescription pain medicine.  Managing pain  · If directed, put ice on the affected area.  ? Put ice in a plastic bag.  ? Place a towel between your skin and the bag.  ? Leave the ice on for 20 minutes, 2-3 times a day.  · After icing, apply heat to the affected area before you exercise or as often as told by your doctor. Use the heat source that your doctor tells you to use, such as a moist heat pack or a heating pad.  ? Place a towel between your skin and the heat source.  ? Leave the heat on for 20-30 minutes.  ? Remove the heat if your skin turns bright red. This is especially important if you are unable to feel pain, heat, or cold. You may have a greater risk of getting burned.  Activity  · Return to your normal activities as told by your doctor. Ask your doctor what activities are safe for you.  ? Avoid activities that make your sciatica worse.  · Take short rests during the day. Rest in a lying or standing position. This is usually better than sitting to rest.  ? When you rest for a long time, do some physical activity or stretching between periods of rest.  ? Avoid sitting for a long time without moving. Get up and move around at least one time each hour.  · Exercise and stretch regularly, as told by your doctor.  · Do not lift anything that is heavier than 10 lb (4.5 kg) while you have symptoms of sciatica.  ? Avoid lifting heavy things even when you do not have symptoms.  ? Avoid lifting  heavy things over and over.  · When you lift objects, always lift in a way that is safe for your body. To do this, you should:  ? Bend your knees.  ? Keep the object close to your body.  ? Avoid twisting.  General instructions  · Use good posture.  ? Avoid leaning forward when you are sitting.  ? Avoid hunching over when you are standing.  · Stay at a healthy weight.  · Wear comfortable shoes that support your feet. Avoid wearing high heels.  · Avoid sleeping on a mattress that is too soft or too hard. You might have less pain if you sleep on a mattress that is firm enough to support your back.  · Keep all follow-up visits as told by your doctor. This is important.  Contact a doctor if:  · You have pain that:  ? Wakes you up when you are sleeping.  ? Gets worse when you lie down.  ? Is worse than the pain you have had in the past.  ? Lasts longer than 4 weeks.  · You lose weight for without trying.  Get help right away if:  · You cannot control when you pee (urinate) or poop (have a bowel movement).  · You   have weakness in any of these areas and it gets worse.  ? Lower back.  ? Lower belly (pelvis).  ? Butt (buttocks).  ? Legs.  · You have redness or swelling of your back.  · You have a burning feeling when you pee.  This information is not intended to replace advice given to you by your health care provider. Make sure you discuss any questions you have with your health care provider.  Document Released: 04/02/2008 Document Revised: 11/30/2015 Document Reviewed: 03/03/2015  Elsevier Interactive Patient Education © 2019 [employer]

## 2018-09-10 NOTE — Progress Notes (Signed)
Patient ID: Carl Barr, male   DOB: 24-Nov-1955, 63 y.o.   MRN: [id number]   Carl Barr, is a 63 y.o. male  XIP:[id number]  [id number]:[id number]  DOB - 1956/05/13  Subjective:  Chief Complaint and HPI: Carl Barr is a 63 y.o. male here today for RF on Naproxen and robaxin.  Both are working well for sciatic pain.  No new s/sx.  He did not go to ortho and doesn't want to due to cost.    ROS:   Constitutional:  No f/c, No night sweats, No unexplained weight loss. EENT:  No vision changes, No blurry vision, No hearing changes. No mouth, throat, or ear problems.  Respiratory: No cough, No SOB Cardiac: No CP, no palpitations GI:  No abd pain, No N/V/D. GU: No Urinary s/sx Musculoskeletal: see above/previous notes Neuro: No headache, no dizziness, no motor weakness.  Skin: No rash Endocrine:  No polydipsia. No polyuria.  Psych: Denies SI/HI  No problems updated.  ALLERGIES: No Known Allergies  PAST MEDICAL HISTORY: Past Medical History:  Diagnosis Date  . Alcohol intoxication ([place])     MEDICATIONS AT HOME: Prior to Admission medications   Medication Sig Start Date End Date Taking? Authorizing Provider  methocarbamol (ROBAXIN) 500 MG tablet 1-2 tid prn muscle spasm 09/10/18  Yes Cloma Rahrig M, PA-C  naproxen (NAPROSYN) 500 MG tablet Take 1 tablet (500 mg total) by mouth 2 (two) times daily with a meal. Prn pain 09/10/18  Yes Argentina Donovan, PA-C     Objective:  EXAM:   Vitals:   09/10/18 1355  BP: 127/81  Pulse: 94  Temp: 98.2 F (36.8 C)  TempSrc: Oral  SpO2: 94%  Weight: 229 lb 6.4 oz (104.1 kg)  Height: 5\' 8"  (1.727 m)    General appearance : A&OX3. NAD. Non-toxic-appearing Chest/Lungs:  Breathing-non-labored, Good air entry bilaterally, breath sounds normal without rales, rhonchi, or wheezing  CVS: S1 S2 regular, no murmurs, gallops, rubs  Extremities: Bilateral Lower Ext shows no edema, both legs are warm to touch with = pulse throughout Neurology:   CN II-XII grossly intact, Non focal.   Psych:  TP linear. J/I WNL. Normal speech. Appropriate eye contact and affect.  Skin:  No Rash  Data Review No results found for: HGBA1C   Assessment & Plan   1. Sciatica of right side Stable/no changes - methocarbamol (ROBAXIN) 500 MG tablet; 1-2 tid prn muscle spasm  Dispense: 120 tablet; Refill: 2 - naproxen (NAPROSYN) 500 MG tablet; Take 1 tablet (500 mg total) by mouth 2 (two) times daily with a meal. Prn pain  Dispense: 60 tablet; Refill: 2  2. Screening for diabetes mellitus Not diabetic.   - Glucose (CBG) Not fasting today so no other bloodwork done    Patient have been counseled extensively about nutrition and exercise  Return in about 3 months (around 12/11/2018) for Fulp for blood work.  The patient was given clear instructions to go to ER or return to medical center if symptoms don't improve, worsen or new problems develop. The patient verbalized understanding. The patient was told to call to get lab results if they haven't heard anything in the next week.     Freeman Caldron, PA-C [facility] [place] and [place], [place]   09/10/2018, 2:11 PM

## 2018-12-16 ENCOUNTER — Other Ambulatory Visit: Payer: Self-pay

## 2018-12-16 ENCOUNTER — Encounter: Payer: Self-pay | Admitting: Family Medicine

## 2018-12-16 ENCOUNTER — Ambulatory Visit: Payer: BC Managed Care – PPO | Attending: Family Medicine | Admitting: Family Medicine

## 2018-12-16 VITALS — BP 127/80 | HR 77 | Temp 98.6°F | Ht 68.0 in | Wt 232.2 lb

## 2018-12-16 DIAGNOSIS — H6121 Impacted cerumen, right ear: Secondary | ICD-10-CM | POA: Diagnosis not present

## 2018-12-16 DIAGNOSIS — M5431 Sciatica, right side: Secondary | ICD-10-CM

## 2018-12-16 DIAGNOSIS — G8929 Other chronic pain: Secondary | ICD-10-CM | POA: Insufficient documentation

## 2018-12-16 DIAGNOSIS — M5136 Other intervertebral disc degeneration, lumbar region: Secondary | ICD-10-CM | POA: Diagnosis not present

## 2018-12-16 DIAGNOSIS — M25562 Pain in left knee: Secondary | ICD-10-CM

## 2018-12-16 DIAGNOSIS — Z79899 Other long term (current) drug therapy: Secondary | ICD-10-CM | POA: Diagnosis not present

## 2018-12-16 DIAGNOSIS — M51369 Other intervertebral disc degeneration, lumbar region without mention of lumbar back pain or lower extremity pain: Secondary | ICD-10-CM | POA: Insufficient documentation

## 2018-12-16 DIAGNOSIS — E669 Obesity, unspecified: Secondary | ICD-10-CM

## 2018-12-16 DIAGNOSIS — Z1211 Encounter for screening for malignant neoplasm of colon: Secondary | ICD-10-CM

## 2018-12-16 MED ORDER — NAPROXEN 500 MG PO TABS: 500.0000 mg | ORAL_TABLET | Freq: Two times a day (BID) | ORAL | 3 refills | Status: DC

## 2018-12-16 MED ORDER — METHOCARBAMOL 500 MG PO TABS: ORAL_TABLET | ORAL | 3 refills | Status: DC

## 2018-12-16 MED FILL — METHOCARBAMOL 500 MG TABS: 500 | 30 days supply | Qty: 90 | Fill #0

## 2018-12-16 MED FILL — NAPROXEN 500 MG TABLET: 500 | 30 days supply | Qty: 60 | Fill #0

## 2018-12-16 NOTE — Progress Notes (Signed)
Established Patient Office Visit  Subjective:  Patient ID: Carl Barr, male    DOB: January 25, 1956  Age: 63 y.o. MRN: [id number]  CC: No chief complaint on file.   HPI Carl Barr presents for follow-up of issues with sciatica due to lumbar degenerative disc disease.  He reports that he actually has had minimal issues with radiation of pain to the right hip and buttock.  He states that he forgot to mention when he was here last and saw another provider that he has been having issues with stiffness and discomfort in the left knee.  Patient states that he would not exactly call it pain but after he has been sitting and then tries to stand up and walk his knee feels stiff.  He has been taking the naproxen that was previously prescribed for his back pain with radiation.  Naproxen does seem to help with his knee.  He denies any abdominal pain, nausea, no blood in the stool or dark stools with the use of naproxen.  He has not had a prior colonoscopy.  He cannot recall exactly how long his knee has been hurting.  He states that one day he "almost did a split" which caused onset of knee pain.  Patient was unable to give a number for his knee pain on a 0-to-10 scale as he stated that it was more of a stiffness and not pain.  He was not able to give any qualification of the pain other than stiffness.  Sometimes his knee feels worse after he has been walking for a while and then he has to stop.  He does not feel as if he is having heaviness in his left lower leg or muscle cramping in the calf.  He does not currently smoke but states that he did smoke a long time ago.  He does have some occasional mild lower extremity edema but not today.      Patient states that he has noticed decreased hearing in his right ear and would like to have his ear checked.  He states that other people have noticed the decreased hearing as well and have asked if he has seen his doctor about this but patient states that he forgets to  mention it when he comes to the doctor.  He has had no headaches or dizziness.  No chest pain or palpitations.  No abdominal pain-no nausea/vomiting/diarrhea or constipation.  No shortness of breath or cough.  No urinary frequency, urgency or dysuria.  He denies any nocturia.  He denies increased thirst.  He does not believe that there is any significant family history of illnesses.  Past Medical History:  Diagnosis Date  . Alcohol intoxication ([place])   . Lumbar degenerative disc disease   . Lumbar radiculopathy, right     Past Surgical History:  Procedure Laterality Date  . denies past surgery      Family History  Problem Relation Age of Onset  . Diabetes Neg Hx   . Hypertension Neg Hx     Social History   Tobacco Use  . Smoking status: Former Research scientist (life sciences)  . Smokeless tobacco: Never Used  . Tobacco comment: Patient DENIES 07/10/2018  Substance Use Topics  . Alcohol use: Yes    Comment: "alot"- patient DENIES 07/10/2018  . Drug use: Not Currently    Outpatient Medications Prior to Visit  Medication Sig Dispense Refill  . methocarbamol (ROBAXIN) 500 MG tablet 1-2 tid prn muscle spasm 120 tablet 2  . naproxen (NAPROSYN)  500 MG tablet Take 1 tablet (500 mg total) by mouth 2 (two) times daily with a meal. Prn pain 60 tablet 2   No facility-administered medications prior to visit.     No Known Allergies  ROS Review of Systems  Constitutional: Positive for fatigue (occasional). Negative for chills.  HENT: Negative for ear pain, sore throat and trouble swallowing.        Decreased hearing/hearing loss in right ear  Respiratory: Negative for cough and shortness of breath.   Cardiovascular: Negative for chest pain, palpitations and leg swelling.  Gastrointestinal: Negative for abdominal pain, anal bleeding, constipation, diarrhea and nausea.  Endocrine: Negative for cold intolerance, heat intolerance, polydipsia, polyphagia and polyuria.  Genitourinary: Negative for dysuria and  frequency.       Denies nocturia  Musculoskeletal: Positive for arthralgias and gait problem. Negative for joint swelling.  Neurological: Negative for dizziness and headaches.  Hematological: Negative for adenopathy. Does not bruise/bleed easily.  Psychiatric/Behavioral: Negative for sleep disturbance. The patient is not nervous/anxious.       Objective:    Physical Exam  Constitutional: He is oriented to person, place, and time. He appears well-developed and well-nourished. No distress.  HENT:  Right Ear: External ear normal. Decreased hearing is noted.  Left Ear: Hearing and external ear normal.  Impacted cerumen in the right canal which completely obscures the TM; left TM with partial wax build-up which partially obscures the TM; visible TM is within normal  Neck: Normal range of motion. Neck supple.  Cardiovascular: Normal rate and regular rhythm.  Pulmonary/Chest: Effort normal and breath sounds normal.  Abdominal: Soft. There is no abdominal tenderness. There is no rebound and no guarding.  Musculoskeletal:        General: No edema.     Left knee: He exhibits decreased range of motion and swelling. He exhibits no effusion, no erythema, normal alignment, no LCL laxity, normal patellar mobility, no bony tenderness and no MCL laxity. Tenderness found. Medial joint line (very mild joint line tenderness) and lateral joint line tenderness noted. No MCL, no LCL and no patellar tendon tenderness noted.     Comments: No lower back or right SI joint tenderness at today's visit  Lymphadenopathy:    He has no cervical adenopathy.  Neurological: He is alert and oriented to person, place, and time.  Skin: Skin is warm and dry.  Psychiatric: He has a normal mood and affect. His behavior is normal. Judgment and thought content normal.  Nursing note and vitals reviewed.   BP 127/80 (BP Location: Left Arm, Patient Position: Sitting, Cuff Size: Large)   Pulse 77   Temp 98.6 F (37 C) (Oral)    Ht '5\' 8"'$  (1.727 m)   Wt 232 lb 3.2 oz (105.3 kg)   SpO2 97%   BMI 35.31 kg/m  Wt Readings from Last 3 Encounters:  12/16/18 232 lb 3.2 oz (105.3 kg)  09/10/18 229 lb 6.4 oz (104.1 kg)  07/10/18 235 lb (106.6 kg)     Health Maintenance Due  Topic Date Due  . Hepatitis C Screening  12-May-1956  . HIV Screening  10/25/1970  . TETANUS/TDAP  10/25/1974  . COLONOSCOPY  10/24/2005      No results found for: TSH No results found for: WBC, HGB, HCT, MCV, PLT No results found for: NA, K, CHLORIDE, CO2, GLUCOSE, BUN, CREATININE, BILITOT, ALKPHOS, AST, ALT, PROT, ALBUMIN, CALCIUM, ANIONGAP, EGFR, GFR No results found for: CHOL No results found for: HDL No results found  for: [place] No results found for: TRIG No results found for: CHOLHDL No results found for: HGBA1C    Assessment & Plan:  1. Chronic pain of left knee Patient reports chronic pain of the right knee after almost falling but he cannot exactly remember when the pain began but knows that it has been present for a few months and he has stiffness in his knee after he has been sitting for awhile. I suspect OA of the left knee but patient with very mild joint line tenderness and mild decreased ROM on exam. Will refer to Orthopedics for further evaluation and treatment. Labs in follow-up of use of pain medications - CBC with Differential - Comprehensive metabolic panel - AMB referral to orthopedics  2. Hearing loss of right ear due to cerumen impaction Patient with complaint of hearing loss in the right ear and had cerumen impaction on exam. Ear lavage done by RMA at today's visit with removal of a large amount of wax and patient reported improvement in hearing after this was done. Patient was encouraged to return if continued hearing difficulty or any concerns - Ear Lavage  3. Lumbar degenerative disc disease Patient with prior imaging showing presence of lumbar degenerative disc disease and patient had low back pain with right  sided radiation after a fall in the past but reports that he does not currently have any issues with low back or right leg pain, just pain in his left knee - CBC with Differential - Comprehensive metabolic panel  4. Encounter for long-term (current) use of medications Will check CBC and CMP in follow-up of long term use of NSAIDs/Tylenol for treatment of  back and joint pain - CBC with Differential - Comprehensive metabolic panel  5. Screening for colon cancer Discussed screening for colon cancer and patient agreed to be referred as he has hot had a prior colonoscopy. He denies any current GI issues - Ambulatory referral to Gastroenterology   Follow-up: Return in about 6 months (around 06/17/2019), or if symptoms worsen or fail to improve, for joint pain; return sooner if any concerns or if labs are abnormal.   Antony Blackbird, MD

## 2018-12-16 NOTE — Patient Instructions (Signed)

## 2018-12-16 NOTE — Progress Notes (Signed)
Per previous notes on file, patient was to return to see provider for lab work. Per pt he is not sure why he's here.   3 mo f/u

## 2018-12-17 LAB — COMPREHENSIVE METABOLIC PANEL WITH GFR
ALT: 61 IU/L — ABNORMAL HIGH (ref 0–44)
AST: 39 IU/L (ref 0–40)
Albumin/Globulin Ratio: 1.1 — ABNORMAL LOW (ref 1.2–2.2)
Albumin: 4.1 g/dL (ref 3.8–4.8)
Alkaline Phosphatase: 73 IU/L (ref 39–117)
BUN/Creatinine Ratio: 23 (ref 10–24)
BUN: 15 mg/dL (ref 8–27)
Bilirubin Total: 0.4 mg/dL (ref 0.0–1.2)
CO2: 21 mmol/L (ref 20–29)
Calcium: 9.3 mg/dL (ref 8.6–10.2)
Chloride: 106 mmol/L (ref 96–106)
Creatinine, Ser: 0.64 mg/dL — ABNORMAL LOW (ref 0.76–1.27)
GFR calc Af Amer: 120 mL/min/1.73
GFR calc non Af Amer: 104 mL/min/1.73
Globulin, Total: 3.9 g/dL (ref 1.5–4.5)
Glucose: 102 mg/dL — ABNORMAL HIGH (ref 65–99)
Potassium: 4.1 mmol/L (ref 3.5–5.2)
Sodium: 139 mmol/L (ref 134–144)
Total Protein: 8 g/dL (ref 6.0–8.5)

## 2018-12-17 LAB — CBC WITH DIFFERENTIAL/PLATELET
Basophils Absolute: 0.1 x10E3/uL (ref 0.0–0.2)
Basos: 1 %
EOS (ABSOLUTE): 0.2 x10E3/uL (ref 0.0–0.4)
Eos: 4 %
Hematocrit: 41.4 % (ref 37.5–51.0)
Hemoglobin: 13.5 g/dL (ref 13.0–17.7)
Immature Grans (Abs): 0 x10E3/uL (ref 0.0–0.1)
Immature Granulocytes: 0 %
Lymphocytes Absolute: 0.9 x10E3/uL (ref 0.7–3.1)
Lymphs: 19 %
MCH: 31.1 pg (ref 26.6–33.0)
MCHC: 32.6 g/dL (ref 31.5–35.7)
MCV: 95 fL (ref 79–97)
Monocytes Absolute: 0.6 x10E3/uL (ref 0.1–0.9)
Monocytes: 12 %
Neutrophils Absolute: 3.3 x10E3/uL (ref 1.4–7.0)
Neutrophils: 64 %
Platelets: 225 x10E3/uL (ref 150–450)
RBC: 4.34 x10E6/uL (ref 4.14–5.80)
RDW: 12.3 % (ref 11.6–15.4)
WBC: 5.1 x10E3/uL (ref 3.4–10.8)

## 2018-12-22 ENCOUNTER — Telehealth: Payer: Self-pay | Admitting: Family Medicine

## 2018-12-22 ENCOUNTER — Telehealth: Payer: Self-pay | Admitting: Emergency Medicine

## 2018-12-22 NOTE — Telephone Encounter (Signed)
Pt called wanting to get his lab results...please follow up

## 2018-12-22 NOTE — Telephone Encounter (Signed)
Patient contacted via phone to be given results of labs.  Patient identified by name and date of birth.  Patient given results of labs.  Patient educated on lab results. Questions answered. Patient acknowledged understanding of labs results. 

## 2018-12-24 ENCOUNTER — Ambulatory Visit: Payer: BC Managed Care – PPO | Admitting: Orthopaedic Surgery

## 2018-12-24 NOTE — Telephone Encounter (Signed)
Spoke with Leane Para patient cousin and he stated he will let patient know office called and he will call office back.

## 2019-01-01 ENCOUNTER — Ambulatory Visit: Payer: BC Managed Care – PPO | Admitting: Orthopaedic Surgery

## 2019-01-05 ENCOUNTER — Other Ambulatory Visit: Payer: Self-pay

## 2019-01-05 ENCOUNTER — Ambulatory Visit (INDEPENDENT_AMBULATORY_CARE_PROVIDER_SITE_OTHER): Payer: BC Managed Care – PPO | Admitting: Orthopaedic Surgery

## 2019-01-05 ENCOUNTER — Ambulatory Visit: Payer: BC Managed Care – PPO

## 2019-01-05 DIAGNOSIS — M25562 Pain in left knee: Secondary | ICD-10-CM

## 2019-01-05 DIAGNOSIS — G8929 Other chronic pain: Secondary | ICD-10-CM

## 2019-01-05 MED ORDER — MELOXICAM 7.5 MG PO TABS: 7.5000 mg | ORAL_TABLET | Freq: Two times a day (BID) | ORAL | 2 refills | Status: DC | PRN

## 2019-01-05 MED FILL — MELOXICAM 7.5 MG TABLET: 7.5 | 15 days supply | Qty: 30 | Fill #0

## 2019-01-05 NOTE — Progress Notes (Signed)
Office Visit Note   Patient: Carl Barr           Date of Birth: [birth year]           MRN: [id number] Visit Date: 01/05/2019              Requested by: Antony Blackbird, MD [place],  [place] [id number] PCP: Antony Blackbird, MD   Assessment & Plan: Visit Diagnoses:  1. Chronic pain of left knee     Plan: Impression is left knee moderately severe tricompartmental degenerative joint disease worse in the lateral compartment.  We discussed nonsurgical treatments such as injections and medications and weight loss physical therapy.  Patient would like to start with medications for now.  We briefly discussed in left knee replacement should conservative treatments fail.  Questions encouraged and answered. Total face to face encounter time was greater than 45 minutes and over half of this time was spent in counseling and/or coordination of care.  Follow-Up Instructions: Return if symptoms worsen or fail to improve.   Orders:  Orders Placed This Encounter  Procedures  . XR Knee Complete 4 Views Left   Meds ordered this encounter  Medications  . meloxicam (MOBIC) 7.5 MG tablet    Sig: Take 1 tablet (7.5 mg total) by mouth 2 (two) times daily as needed for pain.    Dispense:  30 tablet    Refill:  2      Procedures: No procedures performed   Clinical Data: No additional findings.   Subjective: Chief Complaint  Patient presents with  . Left Knee - Pain    Carl Barr is a 63 year old gentleman who comes in for chronic left knee pain for several years with recent worsening over the last several months.  He denies any particular injuries.  He states that he has constant pain that is worse with bending and transitioning from sitting to standing.  He does feel some swelling and some weakness and giving way and popping but he denies any locking.  The pain is generalized knee pain.  Denies any numbness and tingling.  He really has not tried any medications or injections.    Review of Systems  Constitutional: Negative.   All other systems reviewed and are negative.    Objective: Vital Signs: There were no vitals taken for this visit.  Physical Exam Vitals signs and nursing note reviewed.  Constitutional:      Appearance: He is well-developed.  HENT:     Head: Normocephalic and atraumatic.  Eyes:     Pupils: Pupils are equal, round, and reactive to light.  Neck:     Musculoskeletal: Neck supple.  Pulmonary:     Effort: Pulmonary effort is normal.  Abdominal:     Palpations: Abdomen is soft.  Musculoskeletal: Normal range of motion.  Skin:    General: Skin is warm.  Neurological:     Mental Status: He is alert and oriented to person, place, and time.  Psychiatric:        Behavior: Behavior normal.        Thought Content: Thought content normal.        Judgment: Judgment normal.     Ortho Exam Left knee exam shows no joint effusion.  He has excellent range of motion.  Collaterals and cruciates are stable.  No joint line tenderness. Specialty Comments:  No specialty comments available.  Imaging: Xr Knee Complete 4 Views Left  Result Date: 01/05/2019 Moderately severe degenerative joint disease  worse in the lateral compartment    PMFS History: Patient Active Problem List   Diagnosis Date Noted  . Lumbar degenerative disc disease 12/16/2018  . Chronic pain of left knee 12/16/2018  . Encounter for long-term (current) use of medications 12/16/2018   Past Medical History:  Diagnosis Date  . Alcohol intoxication ([place])   . Lumbar degenerative disc disease   . Lumbar radiculopathy, right     Family History  Problem Relation Age of Onset  . Diabetes Neg Hx   . Hypertension Neg Hx     Past Surgical History:  Procedure Laterality Date  . denies past surgery     Social History   Occupational History  . Not on file  Tobacco Use  . Smoking status: Former Research scientist (life sciences)  . Smokeless tobacco: Never Used  . Tobacco comment: Patient DENIES  07/10/2018  Substance and Sexual Activity  . Alcohol use: Yes    Comment: "alot"- patient DENIES 07/10/2018  . Drug use: Not Currently  . Sexual activity: Not Currently

## 2019-01-25 ENCOUNTER — Ambulatory Visit: Payer: BC Managed Care – PPO | Admitting: Family Medicine

## 2019-02-15 ENCOUNTER — Encounter: Payer: Self-pay | Admitting: Family Medicine

## 2019-08-01 ENCOUNTER — Emergency Department (HOSPITAL_COMMUNITY)
Admission: EM | Admit: 2019-08-01 | Discharge: 2019-08-01 | Disposition: A | Discharge status: Home / Self Care | Payer: Self-pay | Attending: Emergency Medicine | Admitting: Emergency Medicine

## 2019-08-01 ENCOUNTER — Encounter (HOSPITAL_COMMUNITY): Payer: Self-pay | Admitting: Emergency Medicine

## 2019-08-01 ENCOUNTER — Other Ambulatory Visit: Payer: Self-pay

## 2019-08-01 DIAGNOSIS — E876 Hypokalemia: Secondary | ICD-10-CM | POA: Insufficient documentation

## 2019-08-01 DIAGNOSIS — F10920 Alcohol use, unspecified with intoxication, uncomplicated: Secondary | ICD-10-CM | POA: Insufficient documentation

## 2019-08-01 DIAGNOSIS — Z87891 Personal history of nicotine dependence: Secondary | ICD-10-CM | POA: Insufficient documentation

## 2019-08-01 LAB — COMPREHENSIVE METABOLIC PANEL
ALT: 28 U/L (ref 0–44)
AST: 31 U/L (ref 15–41)
Albumin: 4.1 g/dL (ref 3.5–5.0)
Alkaline Phosphatase: 77 U/L (ref 38–126)
Anion gap: 14 (ref 5–15)
BUN: 10 mg/dL (ref 8–23)
CO2: 22 mmol/L (ref 22–32)
Calcium: 8.9 mg/dL (ref 8.9–10.3)
Chloride: 104 mmol/L (ref 98–111)
Creatinine, Ser: 0.49 mg/dL — ABNORMAL LOW (ref 0.61–1.24)
GFR calc Af Amer: >60 mL/min (ref >60)
GFR calc non Af Amer: >60 mL/min (ref >60)
Glucose, Bld: 101 mg/dL — ABNORMAL HIGH (ref 70–99)
Potassium: 3 mmol/L — ABNORMAL LOW (ref 3.5–5.1)
Sodium: 140 mmol/L (ref 135–145)
Total Bilirubin: 0.8 mg/dL (ref 0.3–1.2)
Total Protein: 8.9 g/dL — ABNORMAL HIGH (ref 6.5–8.1)

## 2019-08-01 LAB — CBC WITH DIFFERENTIAL/PLATELET
Abs Immature Granulocytes: 0.03 10*3/uL (ref 0.00–0.07)
Basophils Absolute: 0 10*3/uL (ref 0.0–0.1)
Basophils Relative: 1 %
Eosinophils Absolute: 0.1 10*3/uL (ref 0.0–0.5)
Eosinophils Relative: 1 %
HCT: 43.6 % (ref 39.0–52.0)
Hemoglobin: 14.9 g/dL (ref 13.0–17.0)
Immature Granulocytes: 0 %
Lymphocytes Relative: 19 %
Lymphs Abs: 1.3 10*3/uL (ref 0.7–4.0)
MCH: 31.4 pg (ref 26.0–34.0)
MCHC: 34.2 g/dL (ref 30.0–36.0)
MCV: 92 fL (ref 80.0–100.0)
Monocytes Absolute: 0.6 10*3/uL (ref 0.1–1.0)
Monocytes Relative: 9 %
Neutro Abs: 4.7 10*3/uL (ref 1.7–7.7)
Neutrophils Relative %: 70 %
Platelets: 255 10*3/uL (ref 150–400)
RBC: 4.74 MIL/uL (ref 4.22–5.81)
RDW: 12.6 % (ref 11.5–15.5)
WBC: 6.7 10*3/uL (ref 4.0–10.5)
nRBC: 0 % (ref 0.0–0.2)

## 2019-08-01 LAB — ETHANOL: Alcohol, Ethyl (B): 276 mg/dL — ABNORMAL HIGH (ref <10)

## 2019-08-01 LAB — CBG MONITORING, ED: Glucose-Capillary: 105 mg/dL — ABNORMAL HIGH (ref 70–99)

## 2019-08-01 MED ORDER — POTASSIUM CHLORIDE CRYS ER 20 MEQ PO TBCR
40.0000 meq | EXTENDED_RELEASE_TABLET | Freq: Once | ORAL | Status: AC
  Administered 2019-08-01: 09:00:00 40 meq via ORAL
  Filled 2019-08-01: qty 2

## 2019-08-01 NOTE — ED Provider Notes (Signed)
CHIEF COMPLAINT: Intoxication  HPI: Patient is a 64 year old male who presents to the emergency department after he was found outside sleeping on the street intoxicated.  Patient reports he has been drinking alcohol today.  Denies drug use.  Denies any pain.  ROS: Level 5 caveat secondary to intoxication  PAST MEDICAL HISTORY/PAST SURGICAL HISTORY:  Past Medical History:  Diagnosis Date  . Alcohol intoxication ([place])   . Lumbar degenerative disc disease   . Lumbar radiculopathy, right     MEDICATIONS:  Prior to Admission medications   Medication Sig Start Date End Date Taking? Authorizing Provider  meloxicam (MOBIC) 7.5 MG tablet Take 1 tablet (7.5 mg total) by mouth 2 (two) times daily as needed for pain. 01/05/19   Leandrew Koyanagi, MD  methocarbamol (ROBAXIN) 500 MG tablet One pill up to three times per day as needed for muscle spasm 12/16/18   Fulp, Cammie, MD  naproxen (NAPROSYN) 500 MG tablet Take 1 tablet (500 mg total) by mouth 2 (two) times daily with a meal. If needed for pain 12/16/18   Fulp, Cammie, MD    ALLERGIES:  No Known Allergies  SOCIAL HISTORY:  Social History   Tobacco Use  . Smoking status: Former Research scientist (life sciences)  . Smokeless tobacco: Never Used  . Tobacco comment: Patient DENIES 07/10/2018  Substance Use Topics  . Alcohol use: Yes    Comment: "alot"- patient DENIES 07/10/2018    FAMILY HISTORY: Family History  Problem Relation Age of Onset  . Diabetes Neg Hx   . Hypertension Neg Hx     EXAM: BP 134/79   Pulse 76   Temp 99.3 F (37.4 C) (Rectal)   Resp 17   Ht 5\' 9"  (1.753 m)   Wt 105 kg   SpO2 98%   BMI 34.18 kg/m  CONSTITUTIONAL: Alert and oriented and responds appropriately to questions.  Chronically ill-appearing, smells of alcohol and urine, close covered in urine HEAD: Normocephalic, atraumatic EYES: Conjunctivae clear, pupils appear equal, EOM appear intact ENT: normal nose; moist mucous membranes NECK: Supple, normal ROM CARD: RRR; S1 and S2  appreciated; no murmurs, no clicks, no rubs, no gallops RESP: Normal chest excursion without splinting or tachypnea; breath sounds clear and equal bilaterally; no wheezes, no rhonchi, no rales, no hypoxia or respiratory distress, speaking full sentences ABD/GI: Normal bowel sounds; non-distended; soft, non-tender, no rebound, no guarding, no peritoneal signs, no hepatosplenomegaly BACK:  The back appears normal EXT: Normal ROM in all joints; no deformity noted, no edema; no cyanosis SKIN: Normal color for age and race; extremities are cool to touch, no rash or lesions NEURO: Moves all extremities equally PSYCH: The patient's mood and manner are appropriate.   MEDICAL DECISION MAKING: Patient here after he was found intoxicated.  No sign of trauma on exam.  Core temperature is 99.3.  Blood glucose normal at 105.  Patient states he has been drinking but unable to tell me how much.  Unable to tell me why he is here.  He denies any pain.  Will check labs, urine and reassess when clinically sober.  ED PROGRESS: Basic labs unremarkable other than slightly low potassium level of 3.0.  Given oral replacement.  Ethanol level, urine pending.  Will need to be monitored until clinically sober.  Signed out to oncoming ED physician.  Anticipate discharge home.   I reviewed all nursing notes and pertinent previous records as available.  I have interpreted any EKGs, lab and urine results, imaging (as available).  Carl Barr was evaluated in Emergency Department on 08/01/2019 for the symptoms described in the history of present illness. He was evaluated in the context of the global COVID-19 pandemic, which necessitated consideration that the patient might be at risk for infection with the SARS-CoV-2 virus that causes COVID-19. Institutional protocols and algorithms that pertain to the evaluation of patients at risk for COVID-19 are in a state of rapid change based on information released by regulatory  bodies including the CDC and federal and state organizations. These policies and algorithms were followed during the patient's care in the ED.  Patient was seen wearing N95, face shield, gloves.    Carl Barr, Delice Bison, DO 08/01/19 [phone]

## 2019-08-01 NOTE — ED Provider Notes (Signed)
Signout from Dr. Leonides Schanz.  64 year old male found sleeping on the street intoxicated.  Patient was not hypothermic on arrival.  Alcohol level elevated 270s.  Vital stable and resting comfortably.  Pulse ox okay. Physical Exam  BP 117/88   Pulse 88   Temp 99.3 F (37.4 C) (Rectal)   Resp 17   Ht 5\' 9"  (1.753 m)   Wt 105 kg   SpO2 94%   BMI 34.18 kg/m   Physical Exam  ED Course/Procedures     Procedures  MDM  Patient mildly hypokalemic and potassium ordered.  Have asked nurse to try to wake the patient up soon and get him something to eat.  9:20 AM.  Patient now awake and eating breakfast.  No complaints.       Hayden Rasmussen, MD 08/01/19 [phone]

## 2019-08-01 NOTE — ED Notes (Signed)
Pt has no urine in output bag.

## 2019-08-01 NOTE — ED Notes (Signed)
He has eaten his (entire) breakfast. He has been undergoing the slow process of getting himself dressed, which he is about to complete. From our supply, I obtain for him a pair of shoes, as his other ones were quite wet. He is in no distress. I will show him the way to the bus stop shortly.

## 2019-08-01 NOTE — Discharge Instructions (Signed)
You were seen in the emergency department after you were found sleeping on the street intoxicated.  You had blood work that did not show any serious findings other than a mildly low potassium.  Please follow-up with your doctor and return to the emergency department if any concerning symptoms.

## 2019-08-01 NOTE — ED Notes (Signed)
He arouses easily and is oriented. I give him his breakfast at this time. He has no other requests. He states "I didn't want to come here, but you brought me". I assure him it is our pleasure to help him.

## 2019-08-01 NOTE — ED Triage Notes (Signed)
Pt arrived via EMS. Pt was sleeping in the road. Pt has ingested ETOH. Pt reports being cold and tired.

## 2019-09-23 IMAGING — CR DG HIP (WITH OR WITHOUT PELVIS) 1V*R*
2 series · 2 of 2 positions shown · non-contrast
Comparison: None.

CLINICAL DATA: Right hip pain

EXAM:
DG HIP (WITH OR WITHOUT PELVIS) 1V RIGHT

[x pelvis (1 of 2)]
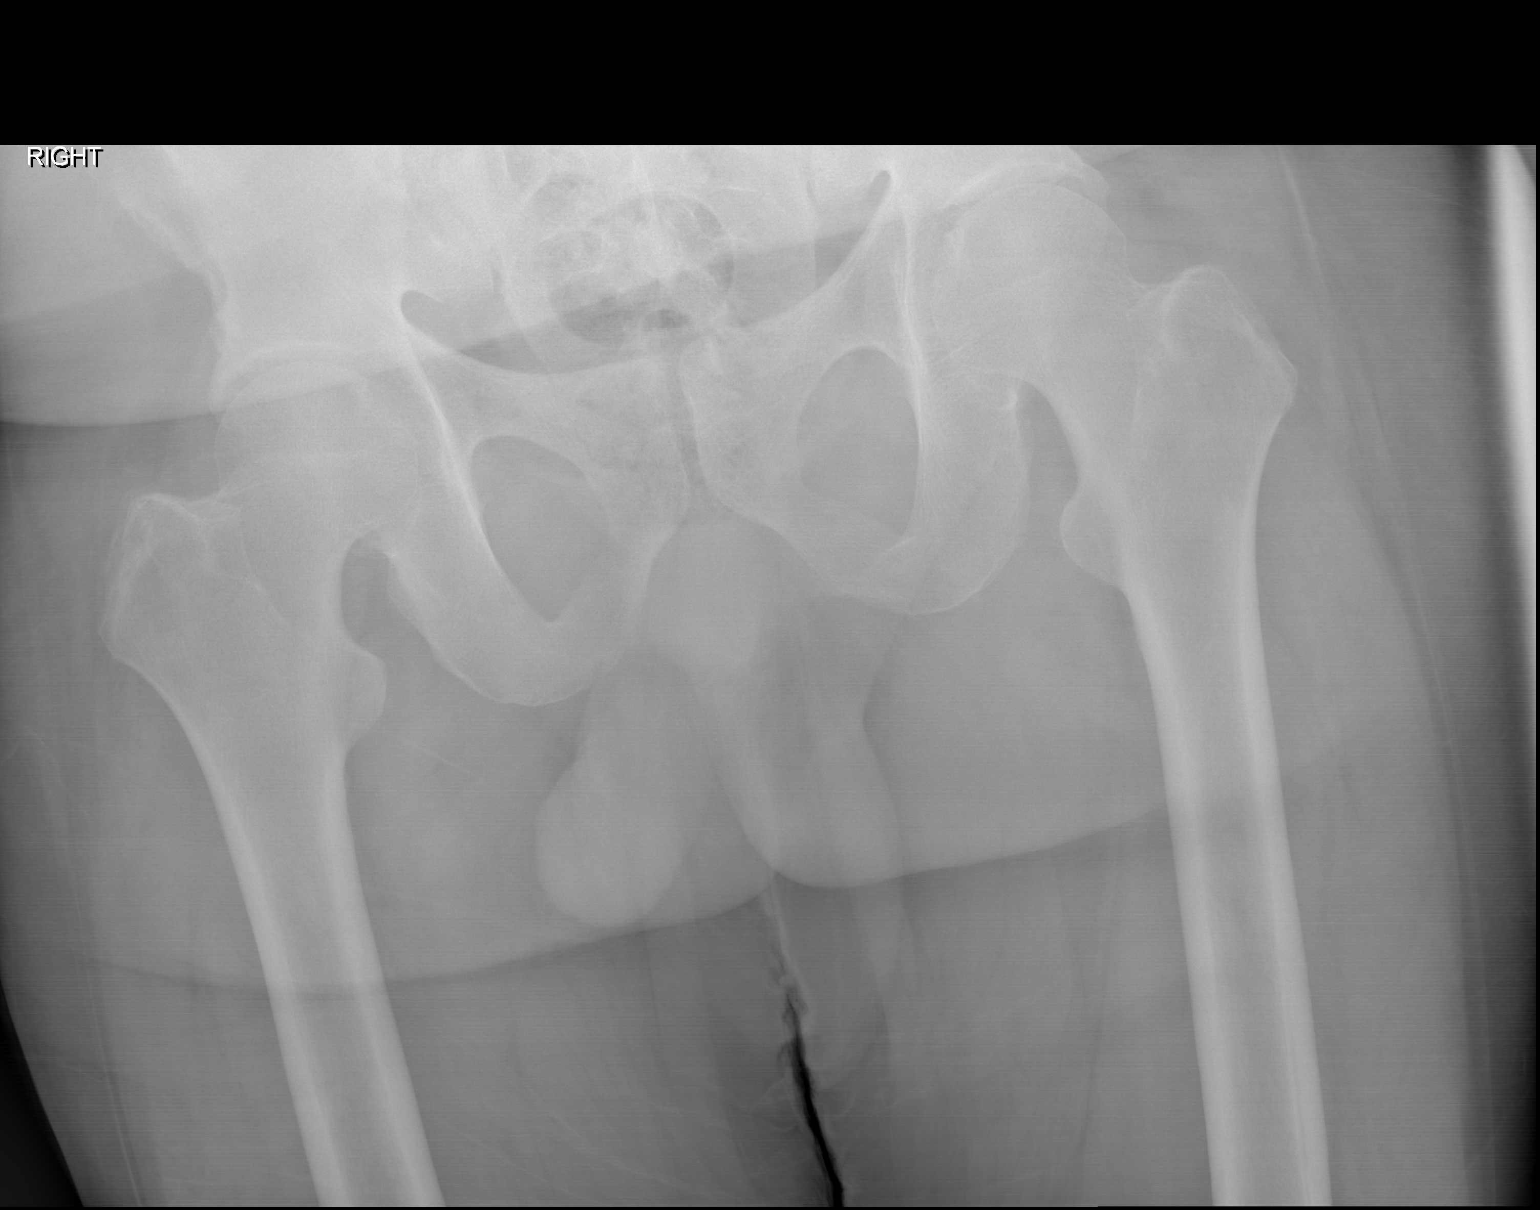

[x pelvis (2 of 2)]
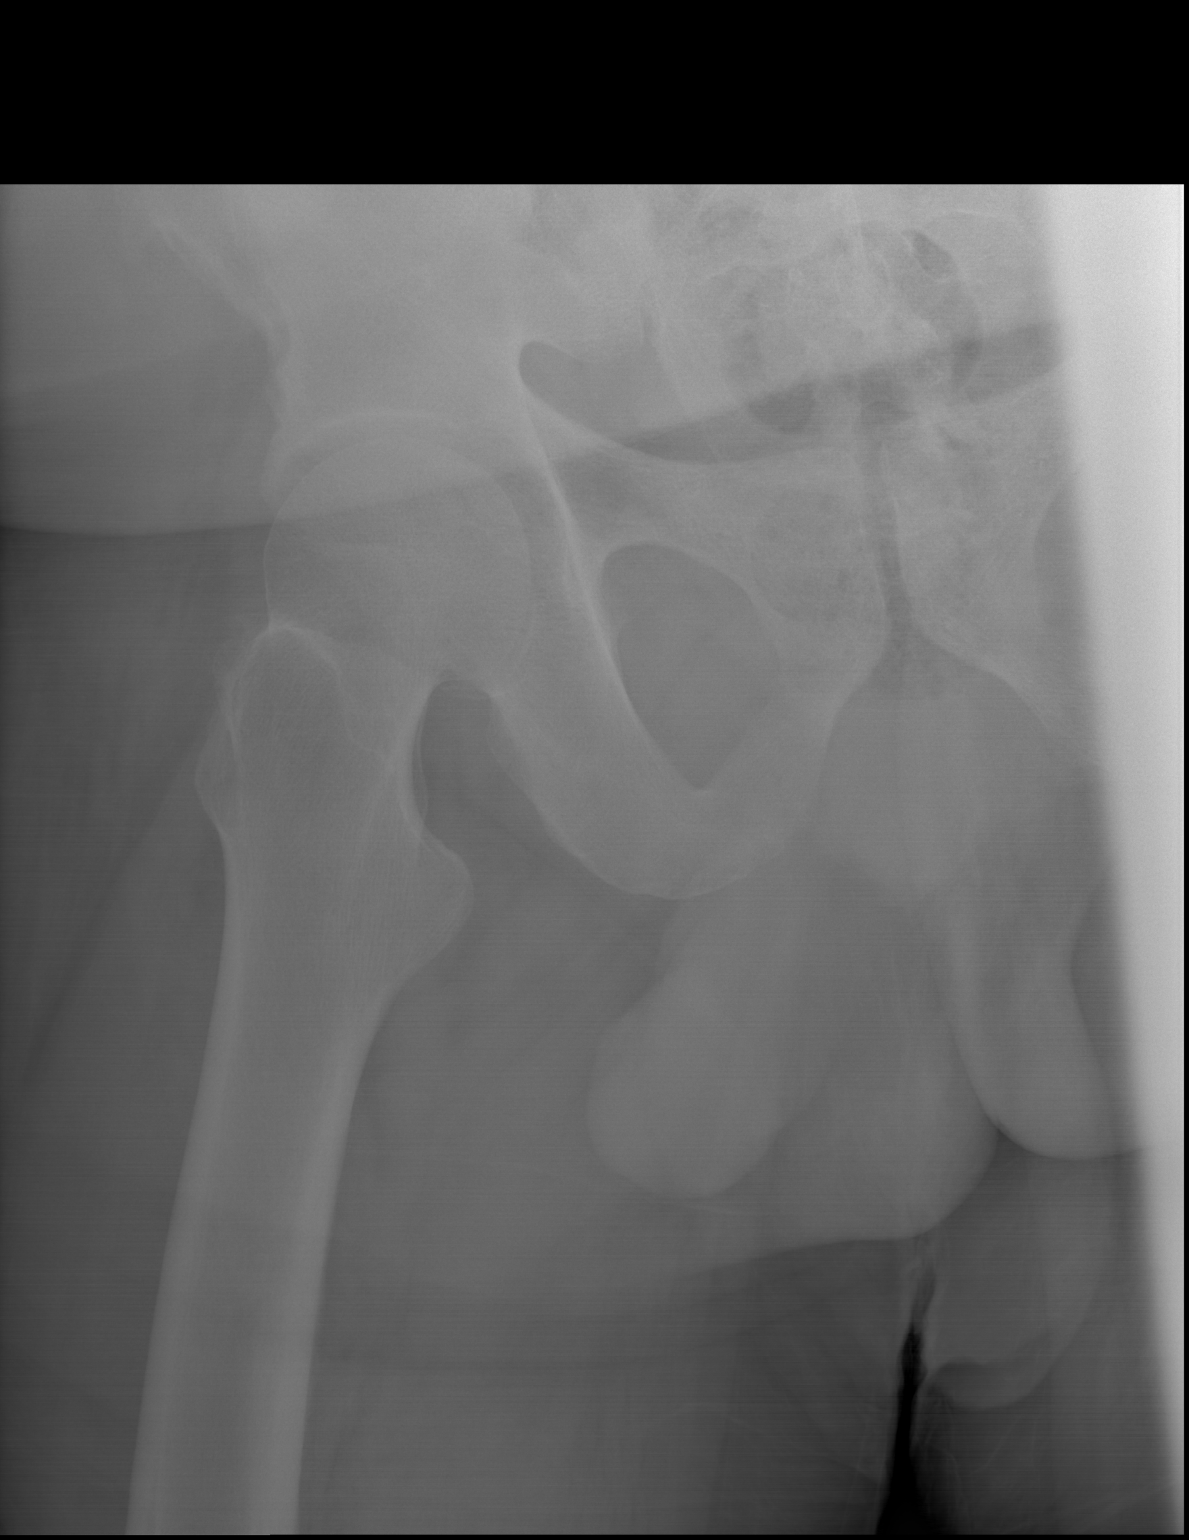

[2 of 2 positions shown; findings below may reference images not displayed]

FINDINGS: There is no evidence of hip fracture or dislocation. There is no
evidence of arthropathy or other focal bone abnormality.
IMPRESSION: No acute osseous injury of the right hip.

## 2020-01-05 ENCOUNTER — Ambulatory Visit: Payer: Self-pay | Admitting: Family Medicine

## 2020-01-07 ENCOUNTER — Other Ambulatory Visit: Payer: Self-pay

## 2020-01-07 ENCOUNTER — Ambulatory Visit: Payer: Self-pay | Attending: Family Medicine | Admitting: Nurse Practitioner

## 2022-01-03 ENCOUNTER — Encounter (HOSPITAL_COMMUNITY): Payer: Self-pay | Admitting: Emergency Medicine

## 2022-01-03 ENCOUNTER — Emergency Department (HOSPITAL_COMMUNITY): Payer: Medicare Other

## 2022-01-03 ENCOUNTER — Inpatient Hospital Stay (HOSPITAL_COMMUNITY)
Admission: EM | Admit: 2022-01-03 | Discharge: 2022-01-28 | DRG: 330 | Disposition: A | Discharge status: Skilled Nursing Facility | Payer: Medicare Other | Attending: Family Medicine | Admitting: Family Medicine

## 2022-01-03 DIAGNOSIS — E440 Moderate protein-calorie malnutrition: Secondary | ICD-10-CM | POA: Diagnosis present

## 2022-01-03 DIAGNOSIS — D75839 Thrombocytosis, unspecified: Secondary | ICD-10-CM | POA: Diagnosis not present

## 2022-01-03 DIAGNOSIS — E876 Hypokalemia: Secondary | ICD-10-CM | POA: Diagnosis present

## 2022-01-03 DIAGNOSIS — E871 Hypo-osmolality and hyponatremia: Secondary | ICD-10-CM | POA: Diagnosis not present

## 2022-01-03 DIAGNOSIS — D509 Iron deficiency anemia, unspecified: Secondary | ICD-10-CM | POA: Diagnosis present

## 2022-01-03 DIAGNOSIS — F1090 Alcohol use, unspecified, uncomplicated: Secondary | ICD-10-CM | POA: Diagnosis present

## 2022-01-03 DIAGNOSIS — C189 Malignant neoplasm of colon, unspecified: Secondary | ICD-10-CM

## 2022-01-03 DIAGNOSIS — S31109A Unspecified open wound of abdominal wall, unspecified quadrant without penetration into peritoneal cavity, initial encounter: Secondary | ICD-10-CM | POA: Diagnosis not present

## 2022-01-03 DIAGNOSIS — Z87891 Personal history of nicotine dependence: Secondary | ICD-10-CM

## 2022-01-03 DIAGNOSIS — Z659 Problem related to unspecified psychosocial circumstances: Secondary | ICD-10-CM | POA: Diagnosis not present

## 2022-01-03 DIAGNOSIS — C779 Secondary and unspecified malignant neoplasm of lymph node, unspecified: Secondary | ICD-10-CM | POA: Diagnosis present

## 2022-01-03 DIAGNOSIS — M5416 Radiculopathy, lumbar region: Secondary | ICD-10-CM | POA: Diagnosis present

## 2022-01-03 DIAGNOSIS — K9189 Other postprocedural complications and disorders of digestive system: Secondary | ICD-10-CM | POA: Diagnosis not present

## 2022-01-03 DIAGNOSIS — K56609 Unspecified intestinal obstruction, unspecified as to partial versus complete obstruction: Principal | ICD-10-CM | POA: Diagnosis present

## 2022-01-03 DIAGNOSIS — M5136 Other intervertebral disc degeneration, lumbar region: Secondary | ICD-10-CM | POA: Diagnosis present

## 2022-01-03 DIAGNOSIS — Z789 Other specified health status: Secondary | ICD-10-CM | POA: Diagnosis present

## 2022-01-03 DIAGNOSIS — Z609 Problem related to social environment, unspecified: Secondary | ICD-10-CM

## 2022-01-03 DIAGNOSIS — Z6832 Body mass index (BMI) 32.0-32.9, adult: Secondary | ICD-10-CM

## 2022-01-03 DIAGNOSIS — F1020 Alcohol dependence, uncomplicated: Secondary | ICD-10-CM | POA: Diagnosis present

## 2022-01-03 DIAGNOSIS — N179 Acute kidney failure, unspecified: Secondary | ICD-10-CM | POA: Diagnosis not present

## 2022-01-03 DIAGNOSIS — C187 Malignant neoplasm of sigmoid colon: Secondary | ICD-10-CM | POA: Diagnosis present

## 2022-01-03 LAB — CBC WITH DIFFERENTIAL/PLATELET
Abs Immature Granulocytes: 0 10*3/uL (ref 0.00–0.07)
Basophils Absolute: 0 10*3/uL (ref 0.0–0.1)
Basophils Relative: 0 %
Eosinophils Absolute: 0.1 10*3/uL (ref 0.0–0.5)
Eosinophils Relative: 1 %
HCT: 41.1 % (ref 39.0–52.0)
Hemoglobin: 13.3 g/dL (ref 13.0–17.0)
Lymphocytes Relative: 31 %
Lymphs Abs: 1.8 10*3/uL (ref 0.7–4.0)
MCH: 24.9 pg — ABNORMAL LOW (ref 26.0–34.0)
MCHC: 32.4 g/dL (ref 30.0–36.0)
MCV: 77 fL — ABNORMAL LOW (ref 80.0–100.0)
Monocytes Absolute: 1.2 10*3/uL — ABNORMAL HIGH (ref 0.1–1.0)
Monocytes Relative: 20 %
Neutro Abs: 2.8 10*3/uL (ref 1.7–7.7)
Neutrophils Relative %: 48 %
Platelets: 420 10*3/uL — ABNORMAL HIGH (ref 150–400)
RBC: 5.34 MIL/uL (ref 4.22–5.81)
RDW: 16.2 % — ABNORMAL HIGH (ref 11.5–15.5)
WBC: 5.8 10*3/uL (ref 4.0–10.5)
nRBC: 0 % (ref 0.0–0.2)
nRBC: 0 /100 WBC

## 2022-01-03 LAB — URINALYSIS, ROUTINE W REFLEX MICROSCOPIC
Bacteria, UA: NONE SEEN
Bilirubin Urine: NEGATIVE
Glucose, UA: NEGATIVE mg/dL
Hgb urine dipstick: NEGATIVE
Ketones, ur: 20 mg/dL — AB
Leukocytes,Ua: NEGATIVE
Nitrite: NEGATIVE
Protein, ur: 30 mg/dL — AB
Specific Gravity, Urine: 1.032 — ABNORMAL HIGH (ref 1.005–1.030)
pH: 5 (ref 5.0–8.0)

## 2022-01-03 LAB — COMPREHENSIVE METABOLIC PANEL
ALT: 16 U/L (ref 0–44)
AST: 17 U/L (ref 15–41)
Albumin: 3.4 g/dL — ABNORMAL LOW (ref 3.5–5.0)
Alkaline Phosphatase: 57 U/L (ref 38–126)
Anion gap: 16 — ABNORMAL HIGH (ref 5–15)
BUN: 23 mg/dL (ref 8–23)
CO2: 26 mmol/L (ref 22–32)
Calcium: 9.2 mg/dL (ref 8.9–10.3)
Chloride: 92 mmol/L — ABNORMAL LOW (ref 98–111)
Creatinine, Ser: 0.88 mg/dL (ref 0.61–1.24)
GFR, Estimated: >60 mL/min (ref >60)
Glucose, Bld: 130 mg/dL — ABNORMAL HIGH (ref 70–99)
Potassium: 2.9 mmol/L — ABNORMAL LOW (ref 3.5–5.1)
Sodium: 134 mmol/L — ABNORMAL LOW (ref 135–145)
Total Bilirubin: 1.2 mg/dL (ref 0.3–1.2)
Total Protein: 8.2 g/dL — ABNORMAL HIGH (ref 6.5–8.1)

## 2022-01-03 LAB — LIPASE, BLOOD: Lipase: 29 U/L (ref 11–51)

## 2022-01-03 MED ORDER — POTASSIUM CHLORIDE 10 MEQ/100ML IV SOLN
10.0000 meq | INTRAVENOUS | Status: AC
  Administered 2022-01-03 – 2022-01-04 (×2): 10 meq via INTRAVENOUS
  Filled 2022-01-03: qty 100

## 2022-01-03 MED ORDER — POTASSIUM CHLORIDE CRYS ER 20 MEQ PO TBCR
40.0000 meq | EXTENDED_RELEASE_TABLET | Freq: Once | ORAL | Status: AC
  Administered 2022-01-03: 40 meq via ORAL
  Filled 2022-01-03: qty 2

## 2022-01-03 MED ORDER — IOHEXOL 350 MG/ML SOLN
80.0000 mL | Freq: Once | INTRAVENOUS | Status: AC | PRN
  Administered 2022-01-03: 80 mL via INTRAVENOUS

## 2022-01-03 MED ORDER — SODIUM CHLORIDE 0.9 % IV BOLUS
1000.0000 mL | Freq: Once | INTRAVENOUS | Status: AC
  Administered 2022-01-03: 1000 mL via INTRAVENOUS

## 2022-01-03 NOTE — ED Provider Triage Note (Signed)
Emergency Medicine Provider Triage Evaluation Note  Carl Barr , a 66 y.o. male  was evaluated in triage.  Pt complains of abdominal pain with nausea and vomiting.  Denies having bowel movement or flatus over the past 5 days.  Patient has tried laxatives at home.  Patient's appetite has decreased.  Abdominal pain is generalized.  Denies chest pain, shortness of breath  Review of Systems  Positive: Abdominal pain, constipation, nausea, vomiting Negative: Chest pain, shortness of breath  Physical Exam  BP (!) 130/102 (BP Location: Left Arm)   Pulse (!) 102   Temp 98.3 F (36.8 C) (Oral)   Resp 15   SpO2 93%  Gen:   Awake, no distress   Resp:  Normal effort  MSK:   Moves extremities without difficulty  Other:    Medical Decision Making  Medically screening exam initiated at 4:45 PM.  Appropriate orders placed.  Carl Barr was informed that the remainder of the evaluation will be completed by another provider, this initial triage assessment does not replace that evaluation, and the importance of remaining in the ED until their evaluation is complete.     Dorothyann Peng, PA-C 01/03/22 1646

## 2022-01-03 NOTE — Consult Note (Addendum)
Reason for Consult:sigmoid stricture Referring Provider: Harout Scheurich is an 66 y.o. male.  HPI: 66 yo male with 5 days of inability to defecate. He will sit on the toilet but nothing comes out. He has never had a colonoscopy. He has vomited multiple times in the last week. He came to the ED because his belly got very large.  Past Medical History:  Diagnosis Date   Alcohol intoxication ([place])    Lumbar degenerative disc disease    Lumbar radiculopathy, right     Past Surgical History:  Procedure Laterality Date   denies past surgery      Family History  Problem Relation Age of Onset   Diabetes Neg Hx    Hypertension Neg Hx     Social History:  reports that he has quit smoking. He has never used smokeless tobacco. He reports current alcohol use. He reports that he does not currently use drugs.  Allergies: No Known Allergies  Medications: I have reviewed the patient's current medications.  Results for orders placed or performed during the hospital encounter of 01/03/22 (from the past 48 hour(s))  CBC with Differential     Status: Abnormal   Collection Time: 01/03/22  5:14 PM  Result Value Ref Range   WBC 5.8 4.0 - 10.5 K/uL   RBC 5.34 4.22 - 5.81 MIL/uL   Hemoglobin 13.3 13.0 - 17.0 g/dL   HCT 41.1 39.0 - 52.0 %   MCV 77.0 (L) 80.0 - 100.0 fL   MCH 24.9 (L) 26.0 - 34.0 pg   MCHC 32.4 30.0 - 36.0 g/dL   RDW 16.2 (H) 11.5 - 15.5 %   Platelets 420 (H) 150 - 400 K/uL   nRBC 0.0 0.0 - 0.2 %   Neutrophils Relative % 48 %   Neutro Abs 2.8 1.7 - 7.7 K/uL   Lymphocytes Relative 31 %   Lymphs Abs 1.8 0.7 - 4.0 K/uL   Monocytes Relative 20 %   Monocytes Absolute 1.2 (H) 0.1 - 1.0 K/uL   Eosinophils Relative 1 %   Eosinophils Absolute 0.1 0.0 - 0.5 K/uL   Basophils Relative 0 %   Basophils Absolute 0.0 0.0 - 0.1 K/uL   nRBC 0 0 /100 WBC   Abs Immature Granulocytes 0.00 0.00 - 0.07 K/uL   Polychromasia PRESENT     Comment: Performed at [facility], [address], [place], [place] [address]  Comprehensive metabolic panel     Status: Abnormal   Collection Time: 01/03/22  5:14 PM  Result Value Ref Range   Sodium 134 (L) 135 - 145 mmol/L   Potassium 2.9 (L) 3.5 - 5.1 mmol/L   Chloride 92 (L) 98 - 111 mmol/L   CO2 26 22 - 32 mmol/L   Glucose, Bld 130 (H) 70 - 99 mg/dL    Comment: Glucose reference range applies only to samples taken after fasting for at least 8 hours.   BUN 23 8 - 23 mg/dL   Creatinine, Ser 0.88 0.61 - 1.24 mg/dL   Calcium 9.2 8.9 - 10.3 mg/dL   Total Protein 8.2 (H) 6.5 - 8.1 g/dL   Albumin 3.4 (L) 3.5 - 5.0 g/dL   AST 17 15 - 41 U/L   ALT 16 0 - 44 U/L   Alkaline Phosphatase 57 38 - 126 U/L   Total Bilirubin 1.2 0.3 - 1.2 mg/dL   GFR, Estimated >60 >60 mL/min    Comment: (NOTE) Calculated using the CKD-EPI  Creatinine Equation (2021)    Anion gap 16 (H) 5 - 15    Comment: Performed at [facility], [place] [address], [place], [place] [address]  Lipase, blood     Status: None   Collection Time: 01/03/22  5:14 PM  Result Value Ref Range   Lipase 29 11 - 51 U/L    Comment: Performed at [facility] [address] [place], [place] [address]  Urinalysis, Routine w reflex microscopic Urine, Clean Catch     Status: Abnormal   Collection Time: 01/03/22 10:22 PM  Result Value Ref Range   Color, Urine YELLOW YELLOW   APPearance HAZY (A) CLEAR   Specific Gravity, Urine 1.032 (H) 1.005 - 1.030   pH 5.0 5.0 - 8.0   Glucose, UA NEGATIVE NEGATIVE mg/dL   Hgb urine dipstick NEGATIVE NEGATIVE   Bilirubin Urine NEGATIVE NEGATIVE   Ketones, ur 20 (A) NEGATIVE mg/dL   Protein, ur 30 (A) NEGATIVE mg/dL   Nitrite NEGATIVE NEGATIVE   Leukocytes,Ua NEGATIVE NEGATIVE   RBC / HPF 0-5 0 - 5 RBC/hpf   WBC, UA 0-5 0 - 5 WBC/hpf   Bacteria, UA NONE SEEN NONE SEEN   Squamous Epithelial / LPF 0-5 0 - 5   Mucus PRESENT    Hyaline Casts, UA PRESENT     Comment: Performed at [facility], [place] [address]  [place], [place] [address]    CT Abdomen Pelvis W Contrast  Result Date: 01/03/2022 CLINICAL DATA:  Bowel obstruction suspected EXAM: CT ABDOMEN AND PELVIS WITH CONTRAST TECHNIQUE: Multidetector CT imaging of the abdomen and pelvis was performed using the standard protocol following bolus administration of intravenous contrast. RADIATION DOSE REDUCTION: This exam was performed according to the departmental dose-optimization program which includes automated exposure control, adjustment of the mA and/or kV according to patient size and/or use of iterative reconstruction technique. CONTRAST:  76m OMNIPAQUE IOHEXOL 350 MG/ML SOLN COMPARISON:  None Available. FINDINGS: Lower chest: Bibasilar linear opacities, likely atelectasis. Hepatobiliary: Scattered subcentimeter hypodensities in the liver, likely small cysts. Gallbladder unremarkable. Pancreas: No focal abnormality or ductal dilatation. Spleen: No focal abnormality.  Normal size. Adrenals/Urinary Tract: No adrenal abnormality. No focal renal abnormality. No stones or hydronephrosis. Urinary bladder is unremarkable. Stomach/Bowel: Markedly dilated small bowel diffusely. Right colon and transverse colon are dilated to the distal transverse colon/splenic flexure where there is circumferential wall thickening and mucosal enhancement. Favor colitis although cancer cannot be excluded. After this area at the splenic flexure, the distal transverse colon and sigmoid colon again become dilated. There is abrupt decompression of the sigmoid colon best seen on sagittal image 82 and axial image 80. Remainder the rectosigmoid colon are decompressed. This areas concerning for possible tumor/malignancy. Vascular/Lymphatic: No evidence of aneurysm or adenopathy. Reproductive: No visible focal abnormality. Other: No free fluid or free air. Musculoskeletal: No acute bony abnormality. IMPRESSION: 2 areas of strictures in the colon. The 1st is at the splenic flexure with circumferential  wall thickening over several cm and mucosal enhancement. This could reflect an area of colitis or malignancy. Second colonic stricture is seen in the sigmoid colon. This area is concerning for malignancy. Bowel obstruction related to these 2 colonic strictures. Small bowel is diffusely dilated, fluid-filled with air-fluid levels. Right colon and transverse colon are also dilated. Bibasilar atelectasis. Electronically Signed   By: KRolm BaptiseM.D.   On: 01/03/2022 22:08    Review of Systems  Constitutional: Negative.   HENT: Negative.  Eyes: Negative.   Respiratory: Negative.    Cardiovascular: Negative.   Gastrointestinal:  Positive for abdominal pain, constipation and vomiting. Negative for blood in stool.  Genitourinary: Negative.   Musculoskeletal: Negative.   Skin: Negative.   Neurological: Negative.   Endo/Heme/Allergies: Negative.   Psychiatric/Behavioral: Negative.      PE Blood pressure (!) 146/78, pulse 92, temperature 98.4 F (36.9 C), temperature source Oral, resp. rate (!) 22, SpO2 97 %. Constitutional: NAD; conversant; no deformities, poor dentition Eyes: Moist conjunctiva; no lid lag; anicteric; PERRL Neck: Trachea midline; no thyromegaly Lungs: Normal respiratory effort; no tactile fremitus CV: RRR; no palpable thrills; no pitting edema GI: Abd distended, nontender; no palpable hepatosplenomegaly MSK: Normal gait; no clubbing/cyanosis Psychiatric: Appropriate affect; alert and oriented x3 Lymphatic: No palpable cervical or axillary lymphadenopathy Skin: No major subcutaneous nodules. 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 -NPO -NG tube (patient complaining of impending vomit, dilated stomach on CT) -admit to medicine -likely will require colectomy with colostomy during this admission. I discussed the details with the patient but he said he was having trouble processing it. We discussed  what would have happened if he didn't come in tonight and how perforation can lead to sepsis and death.  ACS RISK CALCULATOR USE:  Risk Calculator was used for discussion of surgery: Yes      I reviewed last 24 h vitals and pain scores, last 48 h intake and output, last 24 h labs and trends, and last 24 h imaging results.  This care required high  level of medical decision making.   Arta Bruce Tailyn Hantz 01/03/2022, 11:34 PM

## 2022-01-03 NOTE — ED Provider Notes (Signed)
[facility] EMERGENCY DEPARTMENT Provider Note   CSN: [id number] Arrival date & time: 01/03/22  1633     History  Chief Complaint  Patient presents with   Constipation    Carl Barr is a 66 y.o. male.  He does not have any significant past medical history.  He is complaining of being constipated for over a week.  Generalized abdominal pain nausea.  Afraid to eat.  Has tried an over-the-counter laxative today without any improvement.  No fevers or chills.  No rectal bleeding.  [place]  The history is provided by the patient and a relative.  Constipation Severity:  Severe Time since last bowel movement:  1 week Timing:  Constant Progression:  Unchanged Chronicity:  New Relieved by:  Nothing Worsened by:  Nothing Ineffective treatments:  Laxatives Associated symptoms: abdominal pain and nausea   Associated symptoms: no dysuria and no fever        Home Medications Prior to Admission medications   Medication Sig Start Date End Date Taking? Authorizing Provider  meloxicam (MOBIC) 7.5 MG tablet Take 1 tablet (7.5 mg total) by mouth 2 (two) times daily as needed for pain. Patient not taking: Reported on 08/01/2019 01/05/19   Leandrew Koyanagi, MD  methocarbamol (ROBAXIN) 500 MG tablet One pill up to three times per day as needed for muscle spasm Patient not taking: Reported on 08/01/2019 12/16/18   Antony Blackbird, MD  naproxen (NAPROSYN) 500 MG tablet Take 1 tablet (500 mg total) by mouth 2 (two) times daily with a meal. If needed for pain Patient not taking: Reported on 08/01/2019 12/16/18   Antony Blackbird, MD      Allergies    Patient has no known allergies.    Review of Systems   Review of Systems  Constitutional:  Negative for fever.  HENT:  Negative for sore throat.   Respiratory:  Negative for shortness of breath.   Cardiovascular:  Negative for chest pain.  Gastrointestinal:  Positive for abdominal pain, constipation and nausea.  Genitourinary:   Negative for dysuria.  Skin:  Negative for rash.    Physical Exam Updated Vital Signs BP (!) 130/102 (BP Location: Left Arm)   Pulse (!) 102   Temp 98.3 F (36.8 C) (Oral)   Resp 15   SpO2 93%  Physical Exam Vitals and nursing note reviewed.  Constitutional:      General: He is not in acute distress.    Appearance: Normal appearance. He is well-developed.  HENT:     Head: Normocephalic and atraumatic.  Eyes:     Conjunctiva/sclera: Conjunctivae normal.  Cardiovascular:     Rate and Rhythm: Regular rhythm. Tachycardia present.     Heart sounds: No murmur heard. Pulmonary:     Effort: Pulmonary effort is normal. No respiratory distress.     Breath sounds: Normal breath sounds.  Abdominal:     General: There is distension.     Palpations: Abdomen is soft.     Tenderness: There is abdominal tenderness. There is no guarding or rebound.  Musculoskeletal:        General: No swelling.     Cervical back: Neck supple.  Skin:    General: Skin is warm and dry.     Capillary Refill: Capillary refill takes less than 2 seconds.  Neurological:     Mental Status: He is alert.     ED Results / Procedures / Treatments   Labs (all labs ordered are listed, but only abnormal results  are displayed) Labs Reviewed  CBC WITH DIFFERENTIAL/PLATELET - Abnormal; Notable for the following components:      Result Value   MCV 77.0 (*)    MCH 24.9 (*)    RDW 16.2 (*)    Platelets 420 (*)    Monocytes Absolute 1.2 (*)    All other components within normal limits  COMPREHENSIVE METABOLIC PANEL - Abnormal; Notable for the following components:   Sodium 134 (*)    Potassium 2.9 (*)    Chloride 92 (*)    Glucose, Bld 130 (*)    Total Protein 8.2 (*)    Albumin 3.4 (*)    Anion gap 16 (*)    All other components within normal limits  LIPASE, BLOOD  URINALYSIS, ROUTINE W REFLEX MICROSCOPIC  PATHOLOGIST SMEAR REVIEW    EKG EKG Interpretation  Date/Time:  Friday January 04 2022 04:04:22  EDT Ventricular Rate:  89 PR Interval:  190 QRS Duration: 109 QT Interval:  391 QTC Calculation: 476 R Axis:   159 Text Interpretation: Sinus rhythm Probable left atrial enlargement Anterior infarct, old No old tracing to compare Confirmed by Aletta Edouard [phone]) on 01/04/2022 9:23:39 AM  Radiology CT Abdomen Pelvis W Contrast  Result Date: 01/03/2022 CLINICAL DATA:  Bowel obstruction suspected EXAM: CT ABDOMEN AND PELVIS WITH CONTRAST TECHNIQUE: Multidetector CT imaging of the abdomen and pelvis was performed using the standard protocol following bolus administration of intravenous contrast. RADIATION DOSE REDUCTION: This exam was performed according to the departmental dose-optimization program which includes automated exposure control, adjustment of the mA and/or kV according to patient size and/or use of iterative reconstruction technique. CONTRAST:  34m OMNIPAQUE IOHEXOL 350 MG/ML SOLN COMPARISON:  None Available. FINDINGS: Lower chest: Bibasilar linear opacities, likely atelectasis. Hepatobiliary: Scattered subcentimeter hypodensities in the liver, likely small cysts. Gallbladder unremarkable. Pancreas: No focal abnormality or ductal dilatation. Spleen: No focal abnormality.  Normal size. Adrenals/Urinary Tract: No adrenal abnormality. No focal renal abnormality. No stones or hydronephrosis. Urinary bladder is unremarkable. Stomach/Bowel: Markedly dilated small bowel diffusely. Right colon and transverse colon are dilated to the distal transverse colon/splenic flexure where there is circumferential wall thickening and mucosal enhancement. Favor colitis although cancer cannot be excluded. After this area at the splenic flexure, the distal transverse colon and sigmoid colon again become dilated. There is abrupt decompression of the sigmoid colon best seen on sagittal image 82 and axial image 80. Remainder the rectosigmoid colon are decompressed. This areas concerning for possible tumor/malignancy.  Vascular/Lymphatic: No evidence of aneurysm or adenopathy. Reproductive: No visible focal abnormality. Other: No free fluid or free air. Musculoskeletal: No acute bony abnormality. IMPRESSION: 2 areas of strictures in the colon. The 1st is at the splenic flexure with circumferential wall thickening over several cm and mucosal enhancement. This could reflect an area of colitis or malignancy. Second colonic stricture is seen in the sigmoid colon. This area is concerning for malignancy. Bowel obstruction related to these 2 colonic strictures. Small bowel is diffusely dilated, fluid-filled with air-fluid levels. Right colon and transverse colon are also dilated. Bibasilar atelectasis. Electronically Signed   By: KRolm BaptiseM.D.   On: 01/03/2022 22:08    Procedures Procedures    Medications Ordered in ED Medications  fentaNYL (SUBLIMAZE) injection 25-50 mcg ( Intravenous MAR Hold 01/04/22 0831)  ondansetron (ZOFRAN) injection 4 mg ( Intravenous MAR Hold 01/04/22 0831)  potassium chloride 10 mEq in 100 mL IVPB (0 mEq Intravenous Stopped 01/04/22 0817)  lactated ringers infusion ( Intravenous New  Bag/Given 01/04/22 0639)  LORazepam (ATIVAN) tablet 1-4 mg ( Oral MAR Hold 01/04/22 0831)    Or  LORazepam (ATIVAN) injection 1-4 mg ( Intravenous MAR Hold 01/04/22 0831)  thiamine tablet 100 mg ( Oral Automatically Held 01/12/22 1000)    Or  thiamine (B-1) injection 100 mg ( Intravenous Automatically Held 01/12/22 1000)  LORazepam (ATIVAN) injection 0-4 mg ( Intravenous Automatically Held 01/06/22 0000)    Followed by  LORazepam (ATIVAN) injection 0-4 mg ( Intravenous Automatically Held 01/07/22 1800)  cefoTEtan (CEFOTAN) 2 g in sodium chloride 0.9 % 100 mL IVPB ( Intravenous MAR Hold 01/04/22 0831)  lactated ringers infusion ( Intravenous Continued from Pre-op 01/04/22 0911)  sodium chloride 0.9 % bolus 1,000 mL (0 mLs Intravenous Stopped 01/03/22 2222)  potassium chloride SA (KLOR-CON M) CR tablet 40 mEq (40 mEq Oral  Given 01/03/22 2221)  iohexol (OMNIPAQUE) 350 MG/ML injection 80 mL (80 mLs Intravenous Contrast Given 01/03/22 2201)  potassium chloride 10 mEq in 100 mL IVPB (0 mEq Intravenous Stopped 01/04/22 0256)  chlorhexidine (PERIDEX) 0.12 % solution 15 mL (15 mLs Mouth/Throat Given 01/04/22 0855)    Or  Oral care mouth rinse ( Mouth Rinse See Alternative 01/04/22 0855)    ED Course/ Medical Decision Making/ A&P Clinical Course as of 01/04/22 0920  Thu Jan 03, 2022  2215 Patient states he has never had a colonoscopy that he is aware of.  He understands he will need to be admitted to the hospital. [MB]  2224 I reviewed the case with Dr. Kieth Brightly general surgery.  He will see the patient in consult.  Recommending medicine admission.  Patient does not need an NG tube at this time. [MB]  2238 Discussed with Dr. Myna Hidalgo from [facility] hospitalist who will evaluate patient for admission. [MB]    Clinical Course User Index [MB] Hayden Rasmussen, MD                           Medical Decision Making Risk Prescription drug management. Decision regarding hospitalization.   This patient complains of constipation abdominal distention; this involves an extensive number of treatment Options and is a complaint that carries with it a high risk of complications and morbidity. The differential includes constipation, obstruction, mass, ascites, perforation  I ordered, reviewed and interpreted labs, which included CBC with normal white count normal hemoglobin, chemistries with low potassium normal renal function, urinalysis without clear signs of infection I ordered medication IV fluids oral potassium and reviewed PMP when indicated. I ordered imaging studies which included CT abdomen and pelvis and I independently    visualized and interpreted imaging which showed bowel obstruction with colonic strictures Additional history obtained from patient's family members Previous records obtained and reviewed in epic no recent  admissions I consulted general surgery Dr. Kieth Brightly and [facility] hospitalist Dr. Myna Hidalgo and discussed lab and imaging findings and discussed disposition.  Cardiac monitoring reviewed, normal sinus rhythm Social determinants considered, low medical literacy Critical Interventions: None  After the interventions stated above, I reevaluated the patient and found patient to be fairly asymptomatic Admission and further testing considered, he would benefit from admission for further work-up of his bowel obstruction.  Patient in agreement with plan and family updated.         Final Clinical Impression(s) / ED Diagnoses Final diagnoses:  Large bowel obstruction ([place])  Hypokalemia    Rx / DC Orders ED Discharge Orders     None  Hayden Rasmussen, MD 01/04/22 [phone]

## 2022-01-03 NOTE — ED Notes (Signed)
ED Provider at bedside updating family.

## 2022-01-03 NOTE — ED Notes (Signed)
Patient called x1 for vitals with no response

## 2022-01-03 NOTE — ED Notes (Signed)
Surgery at bedside.

## 2022-01-03 NOTE — ED Notes (Signed)
Patient transported to CT 

## 2022-01-03 NOTE — ED Triage Notes (Signed)
Patient here with complaint of constipation, reports having no bowel movements nor flatulence for the last five days. Patient also reports using a laxative during that time with still no bowel movement. Patient is alert, oriented, and in no apparent distress at this time.

## 2022-01-04 ENCOUNTER — Encounter (HOSPITAL_COMMUNITY)
Admission: EM | Disposition: A | Discharge status: Skilled Nursing Facility | Payer: Self-pay | Source: Home / Self Care | Attending: Internal Medicine

## 2022-01-04 ENCOUNTER — Other Ambulatory Visit: Payer: Self-pay

## 2022-01-04 ENCOUNTER — Encounter (HOSPITAL_COMMUNITY): Payer: Self-pay | Admitting: Family Medicine

## 2022-01-04 ENCOUNTER — Inpatient Hospital Stay (HOSPITAL_COMMUNITY): Payer: Medicare Other | Admitting: Anesthesiology

## 2022-01-04 DIAGNOSIS — F1020 Alcohol dependence, uncomplicated: Secondary | ICD-10-CM

## 2022-01-04 DIAGNOSIS — K56609 Unspecified intestinal obstruction, unspecified as to partial versus complete obstruction: Secondary | ICD-10-CM | POA: Diagnosis not present

## 2022-01-04 DIAGNOSIS — Z789 Other specified health status: Secondary | ICD-10-CM | POA: Diagnosis present

## 2022-01-04 DIAGNOSIS — E876 Hypokalemia: Secondary | ICD-10-CM | POA: Diagnosis present

## 2022-01-04 HISTORY — PX: COLOSTOMY: SHX63

## 2022-01-04 HISTORY — PX: PARTIAL COLECTOMY: SHX5273

## 2022-01-04 LAB — POCT I-STAT, CHEM 8
BUN: 20 mg/dL (ref 8–23)
Calcium, Ion: 1.14 mmol/L — ABNORMAL LOW (ref 1.15–1.40)
Chloride: 95 mmol/L — ABNORMAL LOW (ref 98–111)
Creatinine, Ser: 0.7 mg/dL (ref 0.61–1.24)
Glucose, Bld: 116 mg/dL — ABNORMAL HIGH (ref 70–99)
HCT: 41 % (ref 39.0–52.0)
Hemoglobin: 13.9 g/dL (ref 13.0–17.0)
Potassium: 3.1 mmol/L — ABNORMAL LOW (ref 3.5–5.1)
Sodium: 134 mmol/L — ABNORMAL LOW (ref 135–145)
TCO2: 25 mmol/L (ref 22–32)

## 2022-01-04 LAB — PROTIME-INR
INR: 1.2 (ref 0.8–1.2)
Prothrombin Time: 15 seconds (ref 11.4–15.2)

## 2022-01-04 LAB — TYPE AND SCREEN
ABO/RH(D): O POS
Antibody Screen: NEGATIVE

## 2022-01-04 LAB — GLUCOSE, CAPILLARY: Glucose-Capillary: 153 mg/dL — ABNORMAL HIGH (ref 70–99)

## 2022-01-04 LAB — ABO/RH: ABO/RH(D): O POS

## 2022-01-04 SURGERY — COLECTOMY, PARTIAL
Anesthesia: General | Site: Abdomen

## 2022-01-04 MED ORDER — LORAZEPAM 2 MG/ML IJ SOLN
0.0000 mg | Freq: Two times a day (BID) | INTRAMUSCULAR | Status: AC
  Administered 2022-01-07: 2 mg via INTRAVENOUS
  Filled 2022-01-04: qty 1

## 2022-01-04 MED ORDER — ACETAMINOPHEN 10 MG/ML IV SOLN
1000.0000 mg | Freq: Four times a day (QID) | INTRAVENOUS | Status: AC
  Administered 2022-01-04 – 2022-01-05 (×4): 1000 mg via INTRAVENOUS
  Filled 2022-01-04 (×4): qty 100

## 2022-01-04 MED ORDER — FENTANYL CITRATE (PF) 250 MCG/5ML IJ SOLN
INTRAMUSCULAR | Status: DC | PRN
Start: 2022-01-04 — End: 2022-01-04
  Administered 2022-01-04 (×5): 50 ug via INTRAVENOUS

## 2022-01-04 MED ORDER — HYDROMORPHONE HCL 1 MG/ML IJ SOLN
INTRAMUSCULAR | Status: AC
  Filled 2022-01-04: qty 1

## 2022-01-04 MED ORDER — FENTANYL CITRATE PF 50 MCG/ML IJ SOSY
25.0000 ug | PREFILLED_SYRINGE | INTRAMUSCULAR | Status: DC | PRN
  Administered 2022-01-04: 25 ug via INTRAVENOUS
  Administered 2022-01-04: 50 ug via INTRAVENOUS
  Filled 2022-01-04 (×2): qty 1

## 2022-01-04 MED ORDER — THIAMINE HCL 100 MG PO TABS
100.0000 mg | ORAL_TABLET | Freq: Every day | ORAL | Status: DC
  Filled 2022-01-04: qty 1

## 2022-01-04 MED ORDER — DEXMEDETOMIDINE (PRECEDEX) IN NS 20 MCG/5ML (4 MCG/ML) IV SYRINGE
PREFILLED_SYRINGE | INTRAVENOUS | Status: DC | PRN
  Administered 2022-01-04: 4 ug via INTRAVENOUS
  Administered 2022-01-04 (×2): 8 ug via INTRAVENOUS
  Administered 2022-01-04 (×2): 4 ug via INTRAVENOUS
  Administered 2022-01-04: 8 ug via INTRAVENOUS
  Administered 2022-01-04: 4 ug via INTRAVENOUS

## 2022-01-04 MED ORDER — PHENYLEPHRINE HCL-NACL 20-0.9 MG/250ML-% IV SOLN
INTRAVENOUS | Status: DC | PRN
  Administered 2022-01-04: 25 ug/min via INTRAVENOUS

## 2022-01-04 MED ORDER — ORAL CARE MOUTH RINSE: 15.0000 mL | Freq: Once | OROMUCOSAL | Status: AC

## 2022-01-04 MED ORDER — LORAZEPAM 2 MG/ML IJ SOLN
0.0000 mg | Freq: Four times a day (QID) | INTRAMUSCULAR | Status: AC
  Filled 2022-01-04: qty 1

## 2022-01-04 MED ORDER — LORAZEPAM 2 MG/ML IJ SOLN: 1.0000 mg | INTRAMUSCULAR | Status: DC | PRN

## 2022-01-04 MED ORDER — PROPOFOL 10 MG/ML IV BOLUS
INTRAVENOUS | Status: AC
  Filled 2022-01-04: qty 20

## 2022-01-04 MED ORDER — ONDANSETRON HCL 4 MG/2ML IJ SOLN
4.0000 mg | Freq: Four times a day (QID) | INTRAMUSCULAR | Status: DC | PRN
  Administered 2022-01-04: 4 mg via INTRAVENOUS
  Filled 2022-01-04: qty 2

## 2022-01-04 MED ORDER — POTASSIUM CHLORIDE 10 MEQ/100ML IV SOLN
10.0000 meq | INTRAVENOUS | Status: DC
  Administered 2022-01-04: 10 meq via INTRAVENOUS
  Filled 2022-01-04 (×2): qty 100

## 2022-01-04 MED ORDER — HYDROMORPHONE 1 MG/ML IV SOLN
INTRAVENOUS | Status: DC
  Administered 2022-01-04: 0.6 mg via INTRAVENOUS
  Administered 2022-01-04: 1 mg via INTRAVENOUS
  Administered 2022-01-04: 0.3 mg via INTRAVENOUS
  Administered 2022-01-05: 0.6 mg via INTRAVENOUS
  Administered 2022-01-05 (×2): 1 mg via INTRAVENOUS
  Administered 2022-01-05: 0.9 mg via INTRAVENOUS
  Administered 2022-01-06 – 2022-01-07 (×6): 0.3 mg via INTRAVENOUS
  Filled 2022-01-04: qty 30

## 2022-01-04 MED ORDER — METHOCARBAMOL 1000 MG/10ML IJ SOLN: 500.0000 mg | Freq: Four times a day (QID) | INTRAVENOUS | Status: DC | PRN

## 2022-01-04 MED ORDER — DEXAMETHASONE SODIUM PHOSPHATE 10 MG/ML IJ SOLN
INTRAMUSCULAR | Status: DC | PRN
  Administered 2022-01-04: 10 mg via INTRAVENOUS

## 2022-01-04 MED ORDER — DIPHENHYDRAMINE HCL 50 MG/ML IJ SOLN: 12.5000 mg | Freq: Four times a day (QID) | INTRAMUSCULAR | Status: DC | PRN

## 2022-01-04 MED ORDER — PROPOFOL 10 MG/ML IV BOLUS
INTRAVENOUS | Status: DC | PRN
  Administered 2022-01-04 (×2): 10 mg via INTRAVENOUS
  Administered 2022-01-04: 180 mg via INTRAVENOUS

## 2022-01-04 MED ORDER — ACETAMINOPHEN 10 MG/ML IV SOLN
INTRAVENOUS | Status: AC
Start: 2022-01-04 — End: ?
  Filled 2022-01-04: qty 100

## 2022-01-04 MED ORDER — DROPERIDOL 2.5 MG/ML IJ SOLN: 0.6250 mg | Freq: Once | INTRAMUSCULAR | Status: DC | PRN

## 2022-01-04 MED ORDER — CHLORHEXIDINE GLUCONATE 0.12 % MT SOLN
OROMUCOSAL | Status: AC
  Administered 2022-01-04: 15 mL via OROMUCOSAL
  Filled 2022-01-04: qty 15

## 2022-01-04 MED ORDER — NALOXONE HCL 0.4 MG/ML IJ SOLN
0.4000 mg | INTRAMUSCULAR | Status: DC | PRN
Start: 2022-01-04 — End: 2022-01-07

## 2022-01-04 MED ORDER — SODIUM CHLORIDE 0.9 % IV SOLN
2.0000 g | Freq: Once | INTRAVENOUS | Status: AC
  Administered 2022-01-04: 2 g via INTRAVENOUS
  Filled 2022-01-04 (×2): qty 2

## 2022-01-04 MED ORDER — ONDANSETRON HCL 4 MG/2ML IJ SOLN
INTRAMUSCULAR | Status: AC
  Filled 2022-01-04: qty 2

## 2022-01-04 MED ORDER — 0.9 % SODIUM CHLORIDE (POUR BTL) OPTIME
TOPICAL | Status: DC | PRN
  Administered 2022-01-04 (×4): 1000 mL

## 2022-01-04 MED ORDER — HEMOSTATIC AGENTS (NO CHARGE) OPTIME
TOPICAL | Status: DC | PRN
  Administered 2022-01-04: 1 via TOPICAL

## 2022-01-04 MED ORDER — MIDAZOLAM HCL 5 MG/5ML IJ SOLN
INTRAMUSCULAR | Status: DC | PRN
  Administered 2022-01-04: 2 mg via INTRAVENOUS

## 2022-01-04 MED ORDER — ALBUMIN HUMAN 5 % IV SOLN: INTRAVENOUS | Status: DC | PRN

## 2022-01-04 MED ORDER — SUGAMMADEX SODIUM 200 MG/2ML IV SOLN
INTRAVENOUS | Status: DC | PRN
  Administered 2022-01-04: 200 mg via INTRAVENOUS

## 2022-01-04 MED ORDER — ONDANSETRON HCL 4 MG/2ML IJ SOLN
4.0000 mg | Freq: Four times a day (QID) | INTRAMUSCULAR | Status: DC | PRN
Start: 2022-01-04 — End: 2022-01-07
  Administered 2022-01-06 – 2022-01-07 (×3): 4 mg via INTRAVENOUS
  Filled 2022-01-04 (×3): qty 2

## 2022-01-04 MED ORDER — HYDROMORPHONE HCL 1 MG/ML IJ SOLN
0.2500 mg | INTRAMUSCULAR | Status: DC | PRN
  Administered 2022-01-04 (×4): 0.5 mg via INTRAVENOUS

## 2022-01-04 MED ORDER — SUCCINYLCHOLINE CHLORIDE 200 MG/10ML IV SOSY
PREFILLED_SYRINGE | INTRAVENOUS | Status: DC | PRN
  Administered 2022-01-04: 100 mg via INTRAVENOUS

## 2022-01-04 MED ORDER — DEXAMETHASONE SODIUM PHOSPHATE 10 MG/ML IJ SOLN
INTRAMUSCULAR | Status: AC
  Filled 2022-01-04: qty 1

## 2022-01-04 MED ORDER — THIAMINE HCL 100 MG/ML IJ SOLN
100.0000 mg | Freq: Every day | INTRAMUSCULAR | Status: DC
  Administered 2022-01-05 – 2022-01-11 (×7): 100 mg via INTRAVENOUS
  Filled 2022-01-04 (×7): qty 2

## 2022-01-04 MED ORDER — ONDANSETRON HCL 4 MG/2ML IJ SOLN
INTRAMUSCULAR | Status: DC | PRN
  Administered 2022-01-04: 4 mg via INTRAVENOUS

## 2022-01-04 MED ORDER — DIPHENHYDRAMINE HCL 12.5 MG/5ML PO ELIX: 12.5000 mg | ORAL_SOLUTION | Freq: Four times a day (QID) | ORAL | Status: DC | PRN

## 2022-01-04 MED ORDER — LACTATED RINGERS IV SOLN: INTRAVENOUS | Status: DC

## 2022-01-04 MED ORDER — PHENYLEPHRINE 80 MCG/ML (10ML) SYRINGE FOR IV PUSH (FOR BLOOD PRESSURE SUPPORT)
PREFILLED_SYRINGE | INTRAVENOUS | Status: DC | PRN
  Administered 2022-01-04 (×3): 160 ug via INTRAVENOUS

## 2022-01-04 MED ORDER — ROCURONIUM BROMIDE 10 MG/ML (PF) SYRINGE
PREFILLED_SYRINGE | INTRAVENOUS | Status: DC | PRN
  Administered 2022-01-04: 80 mg via INTRAVENOUS
  Administered 2022-01-04: 20 mg via INTRAVENOUS

## 2022-01-04 MED ORDER — CHLORHEXIDINE GLUCONATE 0.12 % MT SOLN: 15.0000 mL | Freq: Once | OROMUCOSAL | Status: AC

## 2022-01-04 MED ORDER — LIDOCAINE 2% (20 MG/ML) 5 ML SYRINGE
INTRAMUSCULAR | Status: DC | PRN
  Administered 2022-01-04: 40 mg via INTRAVENOUS

## 2022-01-04 MED ORDER — LACTATED RINGERS IV SOLN: INTRAVENOUS | Status: AC

## 2022-01-04 MED ORDER — SODIUM CHLORIDE 0.9% FLUSH: 9.0000 mL | INTRAVENOUS | Status: DC | PRN

## 2022-01-04 MED ORDER — MIDAZOLAM HCL 2 MG/2ML IJ SOLN
INTRAMUSCULAR | Status: AC
  Filled 2022-01-04: qty 2

## 2022-01-04 MED ORDER — ACETAMINOPHEN 10 MG/ML IV SOLN
INTRAVENOUS | Status: DC | PRN
  Administered 2022-01-04: 1000 mg via INTRAVENOUS

## 2022-01-04 MED ORDER — LORAZEPAM 1 MG PO TABS: 1.0000 mg | ORAL_TABLET | ORAL | Status: DC | PRN

## 2022-01-04 MED ORDER — FENTANYL CITRATE (PF) 250 MCG/5ML IJ SOLN
INTRAMUSCULAR | Status: AC
  Filled 2022-01-04: qty 5

## 2022-01-04 SURGICAL SUPPLY — 49 items
BAG COUNTER SPONGE SURGICOUNT (BAG) ×2 IMPLANT
BAG SPNG CNTER NS LX DISP (BAG) ×1
BLADE CLIPPER SURG (BLADE) ×1 IMPLANT
CANISTER SUCT 3000ML PPV (MISCELLANEOUS) ×2 IMPLANT
COVER SURGICAL LIGHT HANDLE (MISCELLANEOUS) ×2 IMPLANT
DRAPE LAPAROSCOPIC ABDOMINAL (DRAPES) ×1 IMPLANT
DRSG OPSITE POSTOP 3X4 (GAUZE/BANDAGES/DRESSINGS) ×1 IMPLANT
DRSG OPSITE POSTOP 4X10 (GAUZE/BANDAGES/DRESSINGS) ×1 IMPLANT
DRSG OPSITE POSTOP 4X8 (GAUZE/BANDAGES/DRESSINGS) IMPLANT
ELECT BLADE 4.0 EZ CLEAN MEGAD (MISCELLANEOUS) ×2
ELECT CAUTERY BLADE 6.4 (BLADE) ×3 IMPLANT
ELECT REM PT RETURN 9FT ADLT (ELECTROSURGICAL) ×2
ELECTRODE BLDE 4.0 EZ CLN MEGD (MISCELLANEOUS) IMPLANT
ELECTRODE REM PT RTRN 9FT ADLT (ELECTROSURGICAL) ×1 IMPLANT
GLOVE BIO SURGEON STRL SZ8 (GLOVE) ×4 IMPLANT
GLOVE BIOGEL PI IND STRL 8 (GLOVE) ×2 IMPLANT
GLOVE BIOGEL PI INDICATOR 8 (GLOVE) ×2
GOWN STRL REUS W/ TWL LRG LVL3 (GOWN DISPOSABLE) ×4 IMPLANT
GOWN STRL REUS W/ TWL XL LVL3 (GOWN DISPOSABLE) ×2 IMPLANT
GOWN STRL REUS W/TWL LRG LVL3 (GOWN DISPOSABLE) ×6
GOWN STRL REUS W/TWL XL LVL3 (GOWN DISPOSABLE) ×2
HEMOSTAT SNOW SURGICEL 2X4 (HEMOSTASIS) ×1 IMPLANT
KIT OSTOMY DRAINABLE 2.75 STR (WOUND CARE) ×1 IMPLANT
KIT TURNOVER KIT B (KITS) ×2 IMPLANT
LIGASURE IMPACT 36 18CM CVD LR (INSTRUMENTS) ×2 IMPLANT
NS IRRIG 1000ML POUR BTL (IV SOLUTION) ×4 IMPLANT
PACK GENERAL/GYN (CUSTOM PROCEDURE TRAY) ×1 IMPLANT
PAD ARMBOARD 7.5X6 YLW CONV (MISCELLANEOUS) ×4 IMPLANT
PENCIL SMOKE EVACUATOR (MISCELLANEOUS) ×2 IMPLANT
RELOAD BL CONTOUR (ENDOMECHANICALS) ×2 IMPLANT
RELOAD STAPLE 40 BLU REG (ENDOMECHANICALS) IMPLANT
SPECIMEN JAR X LARGE (MISCELLANEOUS) ×2 IMPLANT
SPONGE T-LAP 18X18 ~~LOC~~+RFID (SPONGE) ×3 IMPLANT
STAPLER CVD CUT BL 40 RELOAD (ENDOMECHANICALS) ×2 IMPLANT
STAPLER CVD CUT BLU 40 RELOAD (ENDOMECHANICALS) IMPLANT
STAPLER PROXIMATE 75MM BLUE (STAPLE) ×1 IMPLANT
STAPLER VISISTAT 35W (STAPLE) ×2 IMPLANT
SURGILUBE 2OZ TUBE FLIPTOP (MISCELLANEOUS) IMPLANT
SUT PDS AB 1 TP1 96 (SUTURE) ×4 IMPLANT
SUT PROLENE 2 0 CT2 30 (SUTURE) IMPLANT
SUT PROLENE 2 0 KS (SUTURE) IMPLANT
SUT SILK 2 0 SH CR/8 (SUTURE) ×2 IMPLANT
SUT SILK 2 0 TIES 10X30 (SUTURE) ×2 IMPLANT
SUT SILK 3 0 SH CR/8 (SUTURE) ×2 IMPLANT
SUT SILK 3 0 TIES 10X30 (SUTURE) ×1 IMPLANT
SUT VIC AB 3-0 SH 18 (SUTURE) ×1 IMPLANT
TRAY FOLEY MTR SLVR 14FR STAT (SET/KITS/TRAYS/PACK) ×2 IMPLANT
TUBE CONNECTING 12X1/4 (SUCTIONS) ×4 IMPLANT
UNDERPAD 30X36 HEAVY ABSORB (UNDERPADS AND DIAPERS) IMPLANT

## 2022-01-04 NOTE — H&P (Addendum)
History and Physical    Mendel Binsfeld [id number]:[id number] DOB: June 25, 1956 DOA: 01/03/2022  PCP: Antony Blackbird, MD   Patient coming from: Home   Chief Complaint: Unable to move bowels or pass gas   HPI: Carl Barr is a pleasant 66 y.o. male who denies any significant past medical history presents to the emergency department complaining of 5 days of inability to move his bowels or pass gas despite laxatives.  Patient reports that 5 days ago, he developed some nausea, was unable to eat without vomiting, and was unable to move his bowels or pass gas.  He took some laxatives without relief.  He has never had a colonoscopy or regular medical follow-up.  He drinks alcohol every day but will not quantify.  He denies history of alcohol withdrawal.  ED Course: Upon arrival to the ED, patient is found to be afebrile and saturating well on room air with stable blood pressure.  Chemistry panel notable for potassium 2.9.  CBC with microcytosis.  CT of the abdomen pelvis is concerning for bowel obstruction due to 2 areas of colonic stricture, 1 at the splenic flexure with circumferential wall thickening and a second in the sigmoid colon concerning for malignancy.  Patient was given potassium and IV fluids in the ED, surgery was consulted, and hospitalists asked to admit.  Review of Systems:  All other systems reviewed and apart from HPI, are negative.  Past Medical History:  Diagnosis Date   Alcohol intoxication ([place])    Lumbar degenerative disc disease    Lumbar radiculopathy, right     Past Surgical History:  Procedure Laterality Date   denies past surgery      Social History:   reports that he has quit smoking. He has never used smokeless tobacco. He reports current alcohol use. He reports that he does not currently use drugs.  No Known Allergies  Family History  Problem Relation Age of Onset   Diabetes Neg Hx    Hypertension Neg Hx      Prior to Admission medications   Medication Sig  Start Date End Date Taking? Authorizing Provider  meloxicam (MOBIC) 7.5 MG tablet Take 1 tablet (7.5 mg total) by mouth 2 (two) times daily as needed for pain. Patient not taking: Reported on 08/01/2019 01/05/19   Leandrew Koyanagi, MD  methocarbamol (ROBAXIN) 500 MG tablet One pill up to three times per day as needed for muscle spasm Patient not taking: Reported on 08/01/2019 12/16/18   Antony Blackbird, MD  naproxen (NAPROSYN) 500 MG tablet Take 1 tablet (500 mg total) by mouth 2 (two) times daily with a meal. If needed for pain Patient not taking: Reported on 08/01/2019 12/16/18   Antony Blackbird, MD    Physical Exam: Vitals:   01/04/22 0230 01/04/22 0300 01/04/22 0400 01/04/22 0515  BP: (!) 119/93   (!) 138/102  Pulse: 99 100 (!) 102 99  Resp: (!) 36 18 (!) 30 (!) 27  Temp:    98.3 F (36.8 C)  TempSrc:    Oral  SpO2: 93% 93% 96% 96%    Constitutional: NAD, calm  Eyes: PERTLA, lids and conjunctivae normal ENMT: Mucous membranes are moist. Posterior pharynx clear of any exudate or lesions.   Neck: supple, no masses  Respiratory: no wheezing, no crackles. No accessory muscle use.  Cardiovascular: S1 & S2 heard, regular rate and rhythm. No significant JVD. Abdomen: Distension, soft, no rebound pain or guarding.   Musculoskeletal: no clubbing / cyanosis. No joint deformity upper  and lower extremities.   Skin: no significant rashes, lesions, ulcers. Warm, dry, well-perfused. Neurologic: CN 2-12 grossly intact. Sensation intact, DTR normal. Strength 5/5 in all 4 limbs. Alert and oriented.  Psychiatric: Pleasant. Cooperative.    Labs and Imaging on Admission: I have personally reviewed following labs and imaging studies  CBC: Recent Labs  Lab 01/03/22 1714  WBC 5.8  NEUTROABS 2.8  HGB 13.3  HCT 41.1  MCV 77.0*  PLT 161*   Basic Metabolic Panel: Recent Labs  Lab 01/03/22 1714  NA 134*  K 2.9*  CL 92*  CO2 26  GLUCOSE 130*  BUN 23  CREATININE 0.88  CALCIUM 9.2   GFR: CrCl  cannot be calculated (Unknown ideal weight.). Liver Function Tests: Recent Labs  Lab 01/03/22 1714  AST 17  ALT 16  ALKPHOS 57  BILITOT 1.2  PROT 8.2*  ALBUMIN 3.4*   Recent Labs  Lab 01/03/22 1714  LIPASE 29   No results for input(s): "AMMONIA" in the last 168 hours. Coagulation Profile: Recent Labs  Lab 01/04/22 0159  INR 1.2   Cardiac Enzymes: No results for input(s): "CKTOTAL", "CKMB", "CKMBINDEX", "TROPONINI" in the last 168 hours. BNP (last 3 results) No results for input(s): "PROBNP" in the last 8760 hours. HbA1C: No results for input(s): "HGBA1C" in the last 72 hours. CBG: No results for input(s): "GLUCAP" in the last 168 hours. Lipid Profile: No results for input(s): "CHOL", "HDL", "LDLCALC", "TRIG", "CHOLHDL", "LDLDIRECT" in the last 72 hours. Thyroid Function Tests: No results for input(s): "TSH", "T4TOTAL", "FREET4", "T3FREE", "THYROIDAB" in the last 72 hours. Anemia Panel: No results for input(s): "VITAMINB12", "FOLATE", "FERRITIN", "TIBC", "IRON", "RETICCTPCT" in the last 72 hours. Urine analysis:    Component Value Date/Time   COLORURINE YELLOW 01/03/2022 2222   APPEARANCEUR HAZY (A) 01/03/2022 2222   LABSPEC 1.032 (H) 01/03/2022 2222   PHURINE 5.0 01/03/2022 2222   GLUCOSEU NEGATIVE 01/03/2022 2222   HGBUR NEGATIVE 01/03/2022 2222   BILIRUBINUR NEGATIVE 01/03/2022 2222   KETONESUR 20 (A) 01/03/2022 2222   PROTEINUR 30 (A) 01/03/2022 2222   NITRITE NEGATIVE 01/03/2022 2222   LEUKOCYTESUR NEGATIVE 01/03/2022 2222   Sepsis Labs: '@LABRCNTIP'$ (procalcitonin:4,lacticidven:4) )No results found for this or any previous visit (from the past 240 hour(s)).   Radiological Exams on Admission: CT Abdomen Pelvis W Contrast  Result Date: 01/03/2022 CLINICAL DATA:  Bowel obstruction suspected EXAM: CT ABDOMEN AND PELVIS WITH CONTRAST TECHNIQUE: Multidetector CT imaging of the abdomen and pelvis was performed using the standard protocol following bolus  administration of intravenous contrast. RADIATION DOSE REDUCTION: This exam was performed according to the departmental dose-optimization program which includes automated exposure control, adjustment of the mA and/or kV according to patient size and/or use of iterative reconstruction technique. CONTRAST:  74m OMNIPAQUE IOHEXOL 350 MG/ML SOLN COMPARISON:  None Available. FINDINGS: Lower chest: Bibasilar linear opacities, likely atelectasis. Hepatobiliary: Scattered subcentimeter hypodensities in the liver, likely small cysts. Gallbladder unremarkable. Pancreas: No focal abnormality or ductal dilatation. Spleen: No focal abnormality.  Normal size. Adrenals/Urinary Tract: No adrenal abnormality. No focal renal abnormality. No stones or hydronephrosis. Urinary bladder is unremarkable. Stomach/Bowel: Markedly dilated small bowel diffusely. Right colon and transverse colon are dilated to the distal transverse colon/splenic flexure where there is circumferential wall thickening and mucosal enhancement. Favor colitis although cancer cannot be excluded. After this area at the splenic flexure, the distal transverse colon and sigmoid colon again become dilated. There is abrupt decompression of the sigmoid colon best seen on sagittal image 82  and axial image 80. Remainder the rectosigmoid colon are decompressed. This areas concerning for possible tumor/malignancy. Vascular/Lymphatic: No evidence of aneurysm or adenopathy. Reproductive: No visible focal abnormality. Other: No free fluid or free air. Musculoskeletal: No acute bony abnormality. IMPRESSION: 2 areas of strictures in the colon. The 1st is at the splenic flexure with circumferential wall thickening over several cm and mucosal enhancement. This could reflect an area of colitis or malignancy. Second colonic stricture is seen in the sigmoid colon. This area is concerning for malignancy. Bowel obstruction related to these 2 colonic strictures. Small bowel is diffusely  dilated, fluid-filled with air-fluid levels. Right colon and transverse colon are also dilated. Bibasilar atelectasis. Electronically Signed   By: Rolm Baptise M.D.   On: 01/03/2022 22:08     Assessment/Plan   1. Bowel obstruction  - Presents with no BM in 5 days despite laxative, no flatus  - Large bowel obstruction on CT, likely from malignancy  - Appreciate surgery consultation, will likely need surgery this admission, plan for NGT decompression, NPO, IVF hydration, pain-control   2. Hypokalemia  - Replace potassium, repeat chem panel    3. ?alcohol dependence  - Patient was seen in ED with alcohol intoxication previously and acknowledges daily drinking but will not quantify  - Monitor with CIWA scoring for now and use Ativan if needed    DVT prophylaxis: SCDs  Code Status: Full  Level of Care: Level of care: Med-Surg Family Communication: none  Disposition Plan:  Patient is from: Home  Anticipated d/c is to: TBD Anticipated d/c date is: 01/07/22  Patient currently: likely needs surgery this admission  Consults called: Surgery  Admission status: Inpatient     Vianne Bulls, MD [facility]  01/04/2022, 6:17 AM

## 2022-01-04 NOTE — Anesthesia Preprocedure Evaluation (Addendum)
Anesthesia Evaluation  Patient identified by MRN, date of birth, ID band Patient awake    Reviewed: Allergy & Precautions, NPO status , Patient's Chart, lab work & pertinent test results  History of Anesthesia Complications Negative for: history of anesthetic complications  Airway Mallampati: III  TM Distance: >3 FB Neck ROM: Full    Dental  (+) Missing,    Pulmonary neg pulmonary ROS, former smoker,    Pulmonary exam normal        Cardiovascular negative cardio ROS Normal cardiovascular exam     Neuro/Psych negative neurological ROS  negative psych ROS   GI/Hepatic (+)     substance abuse (unclear amount per patient report)  alcohol use, Large bowel obstruction   Endo/Other  negative endocrine ROS  Renal/GU K 2.9  negative genitourinary   Musculoskeletal negative musculoskeletal ROS (+)   Abdominal   Peds  Hematology negative hematology ROS (+)   Anesthesia Other Findings Day of surgery medications reviewed with patient.  Reproductive/Obstetrics negative OB ROS                            Anesthesia Physical Anesthesia Plan  ASA: 3  Anesthesia Plan: General   Post-op Pain Management: Ofirmev IV (intra-op)*   Induction: Intravenous, Rapid sequence and Cricoid pressure planned  PONV Risk Score and Plan: 2 and Treatment may vary due to age or medical condition, Ondansetron, Dexamethasone and Midazolam  Airway Management Planned: Oral ETT and Video Laryngoscope Planned  Additional Equipment: None  Intra-op Plan:   Post-operative Plan: Extubation in OR  Informed Consent: I have reviewed the patients History and Physical, chart, labs and discussed the procedure including the risks, benefits and alternatives for the proposed anesthesia with the patient or authorized representative who has indicated his/her understanding and acceptance.     Dental advisory given  Plan  Discussed with: CRNA  Anesthesia Plan Comments: (Patient has not tolerated multiple attempts at NGT placement. Will attempt placement in OR with sedation prior to induction due to high aspiration risk. Daiva Huge, MD)       Anesthesia Quick Evaluation

## 2022-01-04 NOTE — Anesthesia Procedure Notes (Signed)
Procedure Name: Intubation Date/Time: 01/04/2022 9:36 AM  Performed by: Colin Benton, CRNAPre-anesthesia Checklist: Patient identified, Emergency Drugs available, Suction available and Patient being monitored Patient Re-evaluated:Patient Re-evaluated prior to induction Oxygen Delivery Method: Circle system utilized Preoxygenation: Pre-oxygenation with 100% oxygen Induction Type: IV induction and Rapid sequence Laryngoscope Size: Glidescope and 4 Grade View: Grade I Tube type: Oral Tube size: 7.5 mm Number of attempts: 1 Airway Equipment and Method: Rigid stylet and Video-laryngoscopy Placement Confirmation: ETT inserted through vocal cords under direct vision, positive ETCO2 and breath sounds checked- equal and bilateral Secured at: 23 cm Tube secured with: Tape Dental Injury: Teeth and Oropharynx as per pre-operative assessment

## 2022-01-04 NOTE — Progress Notes (Signed)
[facility] PLAN OF CARE NOTE Patient: Carl Barr [id number]   PCP: Antony Blackbird, MD DOB: [birth year]   DOA: 01/03/2022   DOS: 01/04/2022    Patient was admitted by my colleague earlier on 01/04/2022. I have reviewed the H&P as well as assessment and plan and agree with the same. Important changes in the plan are listed below.  Plan of care: Principal Problem:   Large bowel obstruction ([facility]) Active Problems:   Alcoholism ([place])   Hypokalemia Seen after the surgery.  SP colectomy and colostomy creation Patient is currently on PCA pump. Has an NG tube and is NPO. Monitor now.  Author: Berle Mull, MD [facility] 01/04/2022 5:26 PM   If 7PM-7AM, please contact night-coverage at [email or web address]

## 2022-01-04 NOTE — ED Notes (Signed)
NG tube insertion attempted again by a different RN, pt didn't tolerate again, pt unable to maintain head position correctly in order to ease inserting further down and is not swallowing nor drinking water to help facilitate.

## 2022-01-04 NOTE — ED Notes (Signed)
Surgeon at bedside & filled out consent form for surgery. Pt signed it & this RN witnessed it. Paper form is at bedside & will go up with pt.

## 2022-01-04 NOTE — Progress Notes (Signed)
Patient arrived to his room with a PCA pump. Verified orders. Patient also has an NG tube. I hooked up to Low-intermittent suction. Vital signs taken and patient resting comfortably.Call bell within reach and family at bedside.

## 2022-01-04 NOTE — ED Notes (Signed)
Explained to patient about NG tube insertion and purpose. Answered patient concerns and questions.   Attempted to insert NG tube, unsuccessful, patient was not able to tolerate insertion-unable to further advance.

## 2022-01-04 NOTE — Progress Notes (Signed)
   01/04/22 1340  Assess: MEWS Score  Temp 97.8 F (36.6 C)  BP (!) 143/104  MAP (mmHg) 116  Pulse Rate (!) 105  Resp 12  Level of Consciousness Alert  SpO2 100 %  O2 Device Nasal Cannula  O2 Flow Rate (L/min) 4 L/min  Assess: MEWS Score  MEWS Temp 0  MEWS Systolic 0  MEWS Pulse 1  MEWS RR 1  MEWS LOC 0  MEWS Score 2  MEWS Score Color Yellow  Assess: if the MEWS score is Yellow or Red  Were vital signs taken at a resting state? Yes (patient had just arrived to floor)  Does the patient meet 2 or more of the SIRS criteria? No  MEWS guidelines implemented *See Row Information* No, previously yellow, continue vital signs every 4 hours  Treat  Pain Scale 0-10  Pain Score Asleep  Assess: SIRS CRITERIA  SIRS Temperature  0  SIRS Pulse 1  SIRS Respirations  0  SIRS WBC 0  SIRS Score Sum  1

## 2022-01-04 NOTE — Transfer of Care (Signed)
Immediate Anesthesia Transfer of Care Note  Patient: Carl Barr  Procedure(s) Performed: LEFT SIGMOID AND DISTAL TRANSVERSE COLECTOMY (Abdomen) COLOSTOMY (Abdomen)  Patient Location: PACU  Anesthesia Type:General  Level of Consciousness: drowsy and patient cooperative  Airway & Oxygen Therapy: Patient Spontanous Breathing and Patient connected to face mask oxygen  Post-op Assessment: Report given to RN and Post -op Vital signs reviewed and stable  Post vital signs: Reviewed and stable  Last Vitals:  Vitals Value Taken Time  BP 146/90 01/04/22 1206  Temp    Pulse 98 01/04/22 1214  Resp 17 01/04/22 1214  SpO2 97 % 01/04/22 1214  Vitals shown include unvalidated device data.  Last Pain:  Vitals:   01/04/22 0850  TempSrc:   PainSc: 10-Worst pain ever      Patients Stated Pain Goal: 2 (03/52/48 1859)  Complications: No notable events documented.

## 2022-01-04 NOTE — Plan of Care (Signed)

## 2022-01-04 NOTE — Anesthesia Postprocedure Evaluation (Signed)
Anesthesia Post Note  Patient: Carl Barr  Procedure(s) Performed: LEFT SIGMOID AND DISTAL TRANSVERSE COLECTOMY (Abdomen) COLOSTOMY (Abdomen)     Patient location during evaluation: PACU Anesthesia Type: General Level of consciousness: awake and alert Pain management: pain level controlled Vital Signs Assessment: post-procedure vital signs reviewed and stable Respiratory status: spontaneous breathing, nonlabored ventilation and respiratory function stable Cardiovascular status: blood pressure returned to baseline Postop Assessment: no apparent nausea or vomiting Anesthetic complications: no   No notable events documented.  Last Vitals:  Vitals:   01/04/22 1255 01/04/22 1310  BP: (!) 142/87   Pulse: 100 (!) 103  Resp: 18 12  Temp:    SpO2: 97% 96%    Last Pain:  Vitals:   01/04/22 1310  TempSrc:   PainSc: Asleep                 Marthenia Rolling

## 2022-01-04 NOTE — Op Note (Signed)
01/04/2022  11:54 AM  PATIENT:  Carl Barr  66 y.o. male  PRE-OPERATIVE DIAGNOSIS:  Large Bowel Obstruction  POST-OPERATIVE DIAGNOSIS:  Large Bowel Obstruction  PROCEDURE:  Procedure(s): LEFT, SIGMOID, AND DISTAL TRANSVERSE COLECTOMY MOBILIZATION OF SPLENIC FLEXURE COLOSTOMY  SURGEON:  Surgeon(s): Georganna Skeans, MD  ASSISTANTS: Melina Modena, PA-C   ANESTHESIA:   general  EBL:  Total I/O In: 9937 [I.V.:1000; IV Piggyback:450] Out: 2040 [Urine:140; Other:1600; Blood:300]  BLOOD ADMINISTERED:none  DRAINS: none   SPECIMEN:  Excision  DISPOSITION OF SPECIMEN:  PATHOLOGY  COUNTS:  YES  DICTATION: .Dragon Dictation Procedure in detail: Informed consent was obtained.  He received intravenous antibiotics.  He was brought to the operating room and general endotracheal anesthesia was administered by the anesthesia staff.  Foley catheter was placed by nursing.  His abdomen was prepped and draped in sterile fashion.  We did a timeout procedure.  I made a midline incision and subcutaneous tissues were dissected down to the anterior fascia.  This was divided sharply along the midline and the peritoneal cavity was entered.  The fascia was opened to the length of the incision.  There was significant dilatation of the bowel including small bowel and colon as expected.  Identified the obstructing mass in his distal sigmoid.  I mobilized the rectosigmoid slightly and divided it approximately 8 to 10 cm distal to the mass with a contour stapler.  I then began taking on the mesentery of the sigmoid colon using the LigaSure and suture ties to get good hemostasis.  The left colon was then mobilized from lateral peritoneal attachments and I divided the mesentery in a similar fashion.  I went about halfway up the left colon at this point and then attention was directed to the distal transverse colon and splenic flexure area.  This was totally soft feeling without a mass on my examination but, in  light of the radiologic findings, we plan to remove this portion as well.  I divided the omentum using the LigaSure.  We then had anesthesia reposition the nasogastric tube and milked back a lot of the small bowel contents to allow visualization and the abdomen.  I divided the distal transverse colon with a GIA 75 stapler.  I took back the mesentery using the LigaSure again towards the splenic flexure.  We then mobilized the splenic flexure which was difficult due to how deep and high it was.  I was able to do this without significant bleeding.  We then further mobilized the remaining left colon.  I took down the remainder of the mesentery using the LigaSure and sutures as needed.  The colon specimen was passed off after marking it for pathology.  I then irrigated and closely inspected the left upper quadrant.  There was no bleeding near the spleen.  Just in case, I placed a piece of Surgicel snow in the area.  There was good hemostasis there.  At this point it appeared that the remaining transverse colon would come out nicely for colostomy in the left upper quadrant.  A circular incision was made there and the subcutaneous fat was cored out down to the fascia.  I made a cruciate incision in the fascia and then opened it into the peritoneal cavity.  I stretched the opening to allow the colon to pass through.  It passed through easily and appeared viable.  The remainder the abdomen was again irrigated and we checked thoroughly for hemostasis.  That was achieved and the bowel was placed in  anatomic position.  The midline suture was closed with running #1 PDS.  Subcutaneous tissues were irrigated and the skin was closed with staples.  I matured the colostomy with 3-0 Vicryl.  It was viable.  Sterile dressings were applied.  An ostomy appliance was applied.  All counts were correct.  There were no apparent complications and he was taken recovery in stable condition. PATIENT DISPOSITION:  PACU - hemodynamically  stable.   Delay start of Pharmacological VTE agent (>24hrs) due to surgical blood loss or risk of bleeding:  no  Georganna Skeans, MD, MPH, FACS Pager: [phone]  6/30/202311:54 AM

## 2022-01-04 NOTE — Progress Notes (Signed)
Patient ID: Carl Barr, male   DOB: Nov 27, 1955, 66 y.o.   MRN: [id number]      Subjective: Vomited a bit more ROS negative except as listed above. Objective: Vital signs in last 24 hours: Temp:  [98.3 F (36.8 C)-98.6 F (37 C)] 98.3 F (36.8 C) (06/30 0515) Pulse Rate:  [79-102] 89 (06/30 0715) Resp:  [15-36] 24 (06/30 0715) BP: (119-157)/(72-123) 135/72 (06/30 0715) SpO2:  [91 %-100 %] 95 % (06/30 0715)    Intake/Output from previous day: 06/29 0701 - 06/30 0700 In: 1200 [IV Piggyback:1200] Out: -  Intake/Output this shift: No intake/output data recorded.  General appearance: alert and cooperative Resp: clear to auscultation bilaterally Cardio: regular rate and rhythm GI: very distended but not tender  Lab Results: CBC  Recent Labs    01/03/22 1714  WBC 5.8  HGB 13.3  HCT 41.1  PLT 420*   BMET Recent Labs    01/03/22 1714  NA 134*  K 2.9*  CL 92*  CO2 26  GLUCOSE 130*  BUN 23  CREATININE 0.88  CALCIUM 9.2   PT/INR Recent Labs    01/04/22 0159  LABPROT 15.0  INR 1.2   ABG No results for input(s): "PHART", "HCO3" in the last 72 hours.  Invalid input(s): "PCO2", "PO2"  Studies/Results: CT Abdomen Pelvis W Contrast  Result Date: 01/03/2022 CLINICAL DATA:  Bowel obstruction suspected EXAM: CT ABDOMEN AND PELVIS WITH CONTRAST TECHNIQUE: Multidetector CT imaging of the abdomen and pelvis was performed using the standard protocol following bolus administration of intravenous contrast. RADIATION DOSE REDUCTION: This exam was performed according to the departmental dose-optimization program which includes automated exposure control, adjustment of the mA and/or kV according to patient size and/or use of iterative reconstruction technique. CONTRAST:  51m OMNIPAQUE IOHEXOL 350 MG/ML SOLN COMPARISON:  None Available. FINDINGS: Lower chest: Bibasilar linear opacities, likely atelectasis. Hepatobiliary: Scattered subcentimeter hypodensities in the liver,  likely small cysts. Gallbladder unremarkable. Pancreas: No focal abnormality or ductal dilatation. Spleen: No focal abnormality.  Normal size. Adrenals/Urinary Tract: No adrenal abnormality. No focal renal abnormality. No stones or hydronephrosis. Urinary bladder is unremarkable. Stomach/Bowel: Markedly dilated small bowel diffusely. Right colon and transverse colon are dilated to the distal transverse colon/splenic flexure where there is circumferential wall thickening and mucosal enhancement. Favor colitis although cancer cannot be excluded. After this area at the splenic flexure, the distal transverse colon and sigmoid colon again become dilated. There is abrupt decompression of the sigmoid colon best seen on sagittal image 82 and axial image 80. Remainder the rectosigmoid colon are decompressed. This areas concerning for possible tumor/malignancy. Vascular/Lymphatic: No evidence of aneurysm or adenopathy. Reproductive: No visible focal abnormality. Other: No free fluid or free air. Musculoskeletal: No acute bony abnormality. IMPRESSION: 2 areas of strictures in the colon. The 1st is at the splenic flexure with circumferential wall thickening over several cm and mucosal enhancement. This could reflect an area of colitis or malignancy. Second colonic stricture is seen in the sigmoid colon. This area is concerning for malignancy. Bowel obstruction related to these 2 colonic strictures. Small bowel is diffusely dilated, fluid-filled with air-fluid levels. Right colon and transverse colon are also dilated. Bibasilar atelectasis. Electronically Signed   By: KRolm BaptiseM.D.   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 We  will remove both areas. I discussed the procedure, risks, and benefits. He agrees. I also  spoke with his cousin at the bedside.  Hypokalemia - received some but suspect anesthesia will re-check  LOS: 1 day    Georganna Skeans, MD, MPH, FACS Trauma & General Surgery Use [email or web address] to contact on call provider  01/04/2022

## 2022-01-05 ENCOUNTER — Encounter (HOSPITAL_COMMUNITY): Payer: Self-pay | Admitting: General Surgery

## 2022-01-05 DIAGNOSIS — K56609 Unspecified intestinal obstruction, unspecified as to partial versus complete obstruction: Secondary | ICD-10-CM | POA: Diagnosis not present

## 2022-01-05 LAB — BASIC METABOLIC PANEL
Anion gap: 11 (ref 5–15)
BUN: 26 mg/dL — ABNORMAL HIGH (ref 8–23)
CO2: 23 mmol/L (ref 22–32)
Calcium: 8.2 mg/dL — ABNORMAL LOW (ref 8.9–10.3)
Chloride: 101 mmol/L (ref 98–111)
Creatinine, Ser: 1.64 mg/dL — ABNORMAL HIGH (ref 0.61–1.24)
GFR, Estimated: 46 mL/min — ABNORMAL LOW (ref >60)
Glucose, Bld: 119 mg/dL — ABNORMAL HIGH (ref 70–99)
Potassium: 3.5 mmol/L (ref 3.5–5.1)
Sodium: 135 mmol/L (ref 135–145)

## 2022-01-05 LAB — CBC
HCT: 37.6 % — ABNORMAL LOW (ref 39.0–52.0)
Hemoglobin: 12.4 g/dL — ABNORMAL LOW (ref 13.0–17.0)
MCH: 25.2 pg — ABNORMAL LOW (ref 26.0–34.0)
MCHC: 33 g/dL (ref 30.0–36.0)
MCV: 76.4 fL — ABNORMAL LOW (ref 80.0–100.0)
Platelets: 319 10*3/uL (ref 150–400)
RBC: 4.92 MIL/uL (ref 4.22–5.81)
RDW: 16.1 % — ABNORMAL HIGH (ref 11.5–15.5)
WBC: 8.9 10*3/uL (ref 4.0–10.5)
nRBC: 0 % (ref 0.0–0.2)

## 2022-01-05 LAB — HIV ANTIBODY (ROUTINE TESTING W REFLEX): HIV Screen 4th Generation wRfx: NONREACTIVE

## 2022-01-05 LAB — PATHOLOGIST SMEAR REVIEW

## 2022-01-05 LAB — CEA: CEA: 5.5 ng/mL — ABNORMAL HIGH (ref 0.0–4.7)

## 2022-01-05 LAB — MAGNESIUM: Magnesium: 2.3 mg/dL (ref 1.7–2.4)

## 2022-01-05 MED ORDER — LACTATED RINGERS IV SOLN
INTRAVENOUS | Status: AC
  Administered 2022-01-07: 900 mL via INTRAVENOUS

## 2022-01-05 MED ORDER — CHLORHEXIDINE GLUCONATE CLOTH 2 % EX PADS
6.0000 | MEDICATED_PAD | Freq: Every day | CUTANEOUS | Status: DC
  Administered 2022-01-05 – 2022-01-25 (×20): 6 via TOPICAL

## 2022-01-05 NOTE — Progress Notes (Signed)
  Progress Note Patient: Carl Barr TIW:[id number] DOB: [birth year] DOA: 01/03/2022  DOS: the patient was seen and examined on 01/05/2022  Brief hospital course: 66 year old male without any past medical history presents to the ER with complaints of constipation with nausea and vomiting.  Found to have bowel obstruction with colonic stricture concerning for possible malignancy. S/P Left sigmoid and distal transverse colectomy and colostomy creation on 6/30. Assessment and Plan: Large bowel obstruction Presents with constipation/obstipation.  CT abdomen shows evidence of 2 strictures with small bowel obstruction. Concern for malignancy. General surgery was consulted. Underwent left sigmoid and distal transverse colectomy as well as colostomy creation on 6/30. Currently has NG tube on IV fluids and has a Dilaudid PCA. Management per general surgery. Patient actually has some stool in his colostomy bag which is highly reassuring. NG output is also minimal. Monitor.  Hypokalemia Resolved. Monitor.  Alcohol use Reported history of alcohol use although no quantity mentioned. Monitor for now.  Subjective: Feeling better.  Pain controlled.  No nausea no vomiting.  Passing gas in the bag.  Physical Exam: Vitals:   01/05/22 0741 01/05/22 0908 01/05/22 1243 01/05/22 1415  BP:  132/87  (!) 141/84  Pulse:  (!) 106  (!) 102  Resp: 18 18 (!) 29 16  Temp:  98.2 F (36.8 C)  98.8 F (37.1 C)  TempSrc:  Oral  Oral  SpO2: 95% 95% 97% 98%  Weight:      Height:       General: Appear in mild distress; no visible Abnormal Neck Mass Or lumps, Conjunctiva normal Cardiovascular: S1 and S2 Present, no Murmur, Respiratory: good respiratory effort, Bilateral Air entry present and CTA, no Crackles, no wheezes Abdomen: Bowel Sound present, Non tender  Extremities: no Pedal edema Neurology: alert and oriented to time, place, and person  Gait not checked due to patient safety concerns   Data  Reviewed: I have Reviewed nursing notes, Vitals, and Lab results since pt's last encounter. Pertinent lab results CBC BMP and mag I have ordered test including CBC and BMP I have reviewed the last note from general surgery,    Family Communication: None at bedside  Disposition: Status is: Inpatient Remains inpatient appropriate because: Still n.p.o.  Author: Berle Mull, MD 01/05/2022 4:36 PM  Please look on [email or web address] to find out who is on call.

## 2022-01-05 NOTE — Hospital Course (Addendum)
Carl Barr is a 66 yo male with past medical history of lumbar degenerative joint disease presented to the hospital with nausea vomiting and obstipation.  CT scan of the abdomen and pelvis showed 2 areas of stricture in the colon involving the splenic flexure and sigmoid colon which was concerning for malignancy.  Patient was then seen by general surgery and underwent left sigmoid and distal transverse colectomy with creation of colostomy on 01/04/2022.  Postoperatively, patient developed ileus with ongoing high output and was followed by general surgery during hospitalization.  At this time, general surgery still following and patient is still not ready for discharge.  Assessment and Plan: Principal Problem:   Large bowel obstruction (HCC) Active Problems:   Open abdominal wall wound   Hypokalemia   Poor social situation   Adenocarcinoma of colon (HCC)   Alcohol use   Hypomagnesemia   Hypophosphatemia   Malnutrition of moderate degree   Malignant large bowel obstruction S/P open extended left hemicolectomy with end colostomy PICC line on 01/11/2022 and was briefly on TPN which has been discontinued 01/17/2022.  Patient has started eating.  Currently with high output fistula.  General surgery following.  Adenocarcinoma of colon [facility]) Pathology with well differentiated adenocarcinoma;  pericolic lymph nodes also noted with the same. Oncology consulted, seen by Dr. Benay Spice.  Outpatient follow-up recommended at this time.   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  General surgery following.  Still not ready.  CT was negative.  Output is getting thicker at this time.  Postoperatively patient had some ileus.  On oral diet at this time   Poor social situation Patient lives at home with his cousin.  Poor health literacy.  Will need ostomy education while inpatient.   Open abdominal wall wound with bleeding. Improved at this time.   Malnutrition of moderate  degree Present on admission.  Body mass index is 31.58 kg/m. Seen by nutrition services.  Was briefly on TPN initially.  Has been started on oral diet.   Hypokalemia Latest potassium of 3.7.  Improved.   Hypophosphatemia Improved after replacement.  Latest phosphate of four-point   Hypomagnesemia Improved.  Latest magnesium of 2.0.   Alcohol use - No further concern for any withdrawal given prolonged hospitalization   Class I obesity. Body mass index is 31.58 kg/m.  Would benefit from weight loss as outpatient.

## 2022-01-05 NOTE — Progress Notes (Signed)
1 Day Post-Op   Subjective/Chief Complaint: Looks good good pain control    Objective: Vital signs in last 24 hours: Temp:  [97.5 F (36.4 C)-99.7 F (37.6 C)] 98.2 F (36.8 C) (07/01 0908) Pulse Rate:  [93-106] 106 (07/01 0908) Resp:  [12-20] 18 (07/01 0908) BP: (117-149)/(79-104) 132/87 (07/01 0908) SpO2:  [95 %-100 %] 95 % (07/01 0908) Last BM Date : 01/05/22  Intake/Output from previous day: 06/30 0701 - 07/01 0700 In: 2252.9 [I.V.:1602.8; IV Piggyback:650.1] Out: 2700 [Urine:650; Emesis/NG output:150; Blood:300] Intake/Output this shift: Total I/O In: 20 [P.O.:20] Out: -   General appearance: alert and cooperative Incision/Wound: incision intact with honeycomb ostomy pink and viable   Lab Results:  Recent Labs    01/03/22 1714 01/04/22 0848 01/05/22 0108  WBC 5.8  --  8.9  HGB 13.3 13.9 12.4*  HCT 41.1 41.0 37.6*  PLT 420*  --  319   BMET Recent Labs    01/03/22 1714 01/04/22 0848 01/05/22 0108  NA 134* 134* 135  K 2.9* 3.1* 3.5  CL 92* 95* 101  CO2 26  --  23  GLUCOSE 130* 116* 119*  BUN 23 20 26*  CREATININE 0.88 0.70 1.64*  CALCIUM 9.2  --  8.2*   PT/INR Recent Labs    01/04/22 0159  LABPROT 15.0  INR 1.2   ABG No results for input(s): "PHART", "HCO3" in the last 72 hours.  Invalid input(s): "PCO2", "PO2"  Studies/Results: CT Abdomen Pelvis W Contrast  Result Date: 01/03/2022 CLINICAL DATA:  Bowel obstruction suspected EXAM: CT ABDOMEN AND PELVIS WITH CONTRAST TECHNIQUE: Multidetector CT imaging of the abdomen and pelvis was performed using the standard protocol following bolus administration of intravenous contrast. RADIATION DOSE REDUCTION: This exam was performed according to the departmental dose-optimization program which includes automated exposure control, adjustment of the mA and/or kV according to patient size and/or use of iterative reconstruction technique. CONTRAST:  53m OMNIPAQUE IOHEXOL 350 MG/ML SOLN COMPARISON:  None  Available. FINDINGS: Lower chest: Bibasilar linear opacities, likely atelectasis. Hepatobiliary: Scattered subcentimeter hypodensities in the liver, likely small cysts. Gallbladder unremarkable. Pancreas: No focal abnormality or ductal dilatation. Spleen: No focal abnormality.  Normal size. Adrenals/Urinary Tract: No adrenal abnormality. No focal renal abnormality. No stones or hydronephrosis. Urinary bladder is unremarkable. Stomach/Bowel: Markedly dilated small bowel diffusely. Right colon and transverse colon are dilated to the distal transverse colon/splenic flexure where there is circumferential wall thickening and mucosal enhancement. Favor colitis although cancer cannot be excluded. After this area at the splenic flexure, the distal transverse colon and sigmoid colon again become dilated. There is abrupt decompression of the sigmoid colon best seen on sagittal image 82 and axial image 80. Remainder the rectosigmoid colon are decompressed. This areas concerning for possible tumor/malignancy. Vascular/Lymphatic: No evidence of aneurysm or adenopathy. Reproductive: No visible focal abnormality. Other: No free fluid or free air. Musculoskeletal: No acute bony abnormality. IMPRESSION: 2 areas of strictures in the colon. The 1st is at the splenic flexure with circumferential wall thickening over several cm and mucosal enhancement. This could reflect an area of colitis or malignancy. Second colonic stricture is seen in the sigmoid colon. This area is concerning for malignancy. Bowel obstruction related to these 2 colonic strictures. Small bowel is diffusely dilated, fluid-filled with air-fluid levels. Right colon and transverse colon are also dilated. Bibasilar atelectasis. Electronically Signed   By: KRolm BaptiseM.D.   On: 01/03/2022 22:08    Anti-infectives: Anti-infectives (From admission, onward)    Start  Dose/Rate Route Frequency Ordered Stop   01/04/22 0830  cefoTEtan (CEFOTAN) 2 g in sodium  chloride 0.9 % 100 mL IVPB        2 g 200 mL/hr over 30 Minutes Intravenous  Once 01/04/22 0805 01/04/22 1010       Assessment/Plan: s/p Procedure(s): LEFT SIGMOID AND DISTAL TRANSVERSE COLECTOMY (N/A) COLOSTOMY (N/A) NPO  cont current care   DVT prevention lovanox   OOB   LOS: 2 days    Turner Daniels MD  01/05/2022

## 2022-01-06 DIAGNOSIS — K56609 Unspecified intestinal obstruction, unspecified as to partial versus complete obstruction: Secondary | ICD-10-CM | POA: Diagnosis not present

## 2022-01-06 LAB — CBC
HCT: 34.1 % — ABNORMAL LOW (ref 39.0–52.0)
Hemoglobin: 11 g/dL — ABNORMAL LOW (ref 13.0–17.0)
MCH: 24.8 pg — ABNORMAL LOW (ref 26.0–34.0)
MCHC: 32.3 g/dL (ref 30.0–36.0)
MCV: 76.8 fL — ABNORMAL LOW (ref 80.0–100.0)
Platelets: 307 10*3/uL (ref 150–400)
RBC: 4.44 MIL/uL (ref 4.22–5.81)
RDW: 16.1 % — ABNORMAL HIGH (ref 11.5–15.5)
WBC: 9.1 10*3/uL (ref 4.0–10.5)
nRBC: 0 % (ref 0.0–0.2)

## 2022-01-06 LAB — BASIC METABOLIC PANEL
Anion gap: 11 (ref 5–15)
BUN: 18 mg/dL (ref 8–23)
CO2: 26 mmol/L (ref 22–32)
Calcium: 8.4 mg/dL — ABNORMAL LOW (ref 8.9–10.3)
Chloride: 102 mmol/L (ref 98–111)
Creatinine, Ser: 0.83 mg/dL (ref 0.61–1.24)
GFR, Estimated: >60 mL/min (ref >60)
Glucose, Bld: 111 mg/dL — ABNORMAL HIGH (ref 70–99)
Potassium: 2.9 mmol/L — ABNORMAL LOW (ref 3.5–5.1)
Sodium: 139 mmol/L (ref 135–145)

## 2022-01-06 MED ORDER — POTASSIUM CHLORIDE 10 MEQ/100ML IV SOLN: 10.0000 meq | INTRAVENOUS | Status: DC

## 2022-01-06 MED ORDER — POTASSIUM CHLORIDE 10 MEQ/100ML IV SOLN
10.0000 meq | INTRAVENOUS | Status: AC
  Administered 2022-01-06 (×5): 10 meq via INTRAVENOUS
  Filled 2022-01-06 (×5): qty 100

## 2022-01-06 NOTE — Progress Notes (Signed)
2 Days Post-Op   Subjective/Chief Complaint: Patient feels okay.  Ostomy output brisk.   Objective: Vital signs in last 24 hours: Temp:  [97.4 F (36.3 C)-98.9 F (37.2 C)] 98.9 F (37.2 C) (07/02 0902) Pulse Rate:  [94-106] 94 (07/02 0902) Resp:  [16-29] 16 (07/02 0902) BP: (141-160)/(69-84) 143/75 (07/02 0902) SpO2:  [95 %-100 %] 98 % (07/02 0902) Last BM Date : 01/05/22  Intake/Output from previous day: 07/01 0701 - 07/02 0700 In: 1790.1 [P.O.:40; I.V.:1520.1; NG/GT:30; IV Piggyback:200] Out: 3800 [Urine:1750; Emesis/NG output:700; Stool:700] Intake/Output this shift: No intake/output data recorded.   General appearance: alert and cooperative Incision/Wound: incision intact with honeycomb ostomy pink and viable  Lab Results:  Recent Labs    01/05/22 0108 01/06/22 0017  WBC 8.9 9.1  HGB 12.4* 11.0*  HCT 37.6* 34.1*  PLT 319 307   BMET Recent Labs    01/05/22 0108 01/06/22 0017  NA 135 139  K 3.5 2.9*  CL 101 102  CO2 23 26  GLUCOSE 119* 111*  BUN 26* 18  CREATININE 1.64* 0.83  CALCIUM 8.2* 8.4*   PT/INR Recent Labs    01/04/22 0159  LABPROT 15.0  INR 1.2   ABG No results for input(s): "PHART", "HCO3" in the last 72 hours.  Invalid input(s): "PCO2", "PO2"  Studies/Results: No results found.  Anti-infectives: Anti-infectives (From admission, onward)    Start     Dose/Rate Route Frequency Ordered Stop   01/04/22 0830  cefoTEtan (CEFOTAN) 2 g in sodium chloride 0.9 % 100 mL IVPB        2 g 200 mL/hr over 30 Minutes Intravenous  Once 01/04/22 0805 01/04/22 1010       Assessment/Plan: s/p Procedure(s): LEFT SIGMOID AND DISTAL TRANSVERSE COLECTOMY (N/A) COLOSTOMY (N/A) CLEAR LIQUID DIET DC NGT Hypokalemia - replace K  OOB if able    DVT prevention lovanox    LOS: 3 days    Turner Daniels MD  01/06/2022

## 2022-01-06 NOTE — Progress Notes (Signed)
  Progress Note Patient: Carl Barr [id number]:[id number] DOB: 30-Nov-1955 DOA: 01/03/2022  DOS: the patient was seen and examined on 01/06/2022  Brief hospital course: 66 year old male without any past medical history presents to the ER with complaints of constipation with nausea and vomiting.  Found to have bowel obstruction with colonic stricture concerning for possible malignancy. S/P Left sigmoid and distal transverse colectomy and colostomy creation on 6/30. Assessment and Plan: Large bowel obstruction Presents with constipation/obstipation.  CT abdomen shows evidence of 2 strictures with small bowel obstruction. Concern for malignancy. General surgery was consulted. Underwent left sigmoid and distal transverse colectomy as well as colostomy creation on 6/30. Currently on IV fluid and Dilaudid PCA.  NG tube removed today. Patient actually has some stool in his colostomy bag which is highly reassuring. Management per surgery.  Hypokalemia Resolved. Monitor.   Alcohol use Reported history of alcohol use although no quantity mentioned.  Patient continues to deny any alcohol intake. Monitor for now.  Subjective: Denies any acute complaint.  No nausea no vomiting.  Pain actually better.  Physical Exam: Vitals:   01/06/22 1608 01/06/22 1852 01/06/22 2000 01/06/22 2018  BP:  (!) 157/79  (!) 143/79  Pulse:  96  99  Resp: '20 16 17 20  '$ Temp:  98.8 F (37.1 C)  99 F (37.2 C)  TempSrc:  Oral  Oral  SpO2: 95% 98% 96% 96%  Weight:      Height:       General: Appear in mild distress; no visible Abnormal Neck Mass Or lumps, Conjunctiva normal Cardiovascular: S1 and S2 Present, no Murmur, Respiratory: good respiratory effort, Bilateral Air entry present and CTA, no Crackles, no wheezes Abdomen: Bowel Sound present, diffusely tender Extremities: no Pedal edema Neurology: alert and oriented to time, place, and person  Gait not checked due to patient safety concerns   Data Reviewed: I  have Reviewed nursing notes, Vitals, and Lab results since pt's last encounter. Pertinent lab results CBC and BMP I have ordered test including CBC and BMP    Family Communication: None at bedside  Disposition: Status is: Inpatient Remains inpatient appropriate because: Still n.p.o. awaiting return of bowel function to advance diet. Author: Berle Mull, MD 01/06/2022 8:23 PM  Please look on [email or web address] to find out who is on call.

## 2022-01-07 ENCOUNTER — Inpatient Hospital Stay (HOSPITAL_COMMUNITY): Payer: Medicare Other

## 2022-01-07 DIAGNOSIS — K56609 Unspecified intestinal obstruction, unspecified as to partial versus complete obstruction: Secondary | ICD-10-CM | POA: Diagnosis not present

## 2022-01-07 LAB — BASIC METABOLIC PANEL
Anion gap: 11 (ref 5–15)
Anion gap: 12 (ref 5–15)
BUN: 11 mg/dL (ref 8–23)
BUN: 13 mg/dL (ref 8–23)
CO2: 25 mmol/L (ref 22–32)
CO2: 25 mmol/L (ref 22–32)
Calcium: 8.8 mg/dL — ABNORMAL LOW (ref 8.9–10.3)
Calcium: 8.8 mg/dL — ABNORMAL LOW (ref 8.9–10.3)
Chloride: 104 mmol/L (ref 98–111)
Chloride: 102 mmol/L (ref 98–111)
Creatinine, Ser: 0.72 mg/dL (ref 0.61–1.24)
Creatinine, Ser: 0.81 mg/dL (ref 0.61–1.24)
GFR, Estimated: >60 mL/min (ref >60)
GFR, Estimated: >60 mL/min (ref >60)
Glucose, Bld: 110 mg/dL — ABNORMAL HIGH (ref 70–99)
Glucose, Bld: 100 mg/dL — ABNORMAL HIGH (ref 70–99)
Potassium: 2.8 mmol/L — ABNORMAL LOW (ref 3.5–5.1)
Potassium: 3.2 mmol/L — ABNORMAL LOW (ref 3.5–5.1)
Sodium: 140 mmol/L (ref 135–145)
Sodium: 139 mmol/L (ref 135–145)

## 2022-01-07 LAB — CBC
HCT: 33.7 % — ABNORMAL LOW (ref 39.0–52.0)
Hemoglobin: 10.8 g/dL — ABNORMAL LOW (ref 13.0–17.0)
MCH: 24.6 pg — ABNORMAL LOW (ref 26.0–34.0)
MCHC: 32 g/dL (ref 30.0–36.0)
MCV: 76.8 fL — ABNORMAL LOW (ref 80.0–100.0)
Platelets: 342 10*3/uL (ref 150–400)
RBC: 4.39 MIL/uL (ref 4.22–5.81)
RDW: 16.4 % — ABNORMAL HIGH (ref 11.5–15.5)
WBC: 9.3 10*3/uL (ref 4.0–10.5)
nRBC: 0.2 % (ref 0.0–0.2)

## 2022-01-07 LAB — PHOSPHORUS: Phosphorus: 2.1 mg/dL — ABNORMAL LOW (ref 2.5–4.6)

## 2022-01-07 LAB — MAGNESIUM: Magnesium: 2 mg/dL (ref 1.7–2.4)

## 2022-01-07 MED ORDER — POTASSIUM CHLORIDE 10 MEQ/100ML IV SOLN
10.0000 meq | INTRAVENOUS | Status: DC
  Administered 2022-01-07 (×5): 10 meq via INTRAVENOUS
  Filled 2022-01-07 (×5): qty 100

## 2022-01-07 MED ORDER — HYDROMORPHONE HCL 1 MG/ML IJ SOLN
0.5000 mg | INTRAMUSCULAR | Status: DC | PRN
  Administered 2022-01-10 – 2022-01-17 (×7): 0.5 mg via INTRAVENOUS
  Filled 2022-01-07 (×7): qty 0.5

## 2022-01-07 MED ORDER — POTASSIUM CHLORIDE 10 MEQ/100ML IV SOLN
10.0000 meq | INTRAVENOUS | Status: AC
  Administered 2022-01-07 (×2): 10 meq via INTRAVENOUS
  Filled 2022-01-07 (×3): qty 100

## 2022-01-07 MED ORDER — METOCLOPRAMIDE HCL 5 MG/ML IJ SOLN
5.0000 mg | Freq: Once | INTRAMUSCULAR | Status: AC
  Administered 2022-01-07: 5 mg via INTRAVENOUS
  Filled 2022-01-07: qty 2

## 2022-01-07 MED ORDER — ENOXAPARIN SODIUM 40 MG/0.4ML IJ SOSY
40.0000 mg | PREFILLED_SYRINGE | INTRAMUSCULAR | Status: DC
  Administered 2022-01-07 – 2022-01-28 (×22): 40 mg via SUBCUTANEOUS
  Filled 2022-01-07 (×22): qty 0.4

## 2022-01-07 MED ORDER — METHOCARBAMOL 1000 MG/10ML IJ SOLN
1000.0000 mg | Freq: Three times a day (TID) | INTRAVENOUS | Status: DC
  Administered 2022-01-07 – 2022-01-21 (×42): 1000 mg via INTRAVENOUS
  Filled 2022-01-07: qty 10
  Filled 2022-01-07 (×2): qty 1000
  Filled 2022-01-07: qty 10
  Filled 2022-01-07: qty 1000
  Filled 2022-01-07 (×2): qty 10
  Filled 2022-01-07: qty 1000
  Filled 2022-01-07: qty 10
  Filled 2022-01-07 (×2): qty 1000
  Filled 2022-01-07: qty 10
  Filled 2022-01-07: qty 1000
  Filled 2022-01-07 (×3): qty 10
  Filled 2022-01-07: qty 1000
  Filled 2022-01-07 (×2): qty 10
  Filled 2022-01-07: qty 1000
  Filled 2022-01-07 (×5): qty 10
  Filled 2022-01-07 (×2): qty 1000
  Filled 2022-01-07 (×2): qty 10
  Filled 2022-01-07: qty 1000
  Filled 2022-01-07 (×7): qty 10
  Filled 2022-01-07: qty 1000
  Filled 2022-01-07: qty 10
  Filled 2022-01-07: qty 1000
  Filled 2022-01-07 (×4): qty 10
  Filled 2022-01-07 (×2): qty 1000

## 2022-01-07 MED ORDER — ACETAMINOPHEN 10 MG/ML IV SOLN
1000.0000 mg | Freq: Four times a day (QID) | INTRAVENOUS | Status: AC
  Administered 2022-01-07 – 2022-01-08 (×3): 1000 mg via INTRAVENOUS
  Filled 2022-01-07 (×4): qty 100

## 2022-01-07 MED ORDER — POTASSIUM CHLORIDE 10 MEQ/100ML IV SOLN: 10.0000 meq | INTRAVENOUS | Status: DC

## 2022-01-07 MED ORDER — POTASSIUM PHOSPHATES 15 MMOLE/5ML IV SOLN
30.0000 mmol | Freq: Once | INTRAVENOUS | Status: AC
  Administered 2022-01-07: 30 mmol via INTRAVENOUS
  Filled 2022-01-07: qty 10

## 2022-01-07 MED ORDER — KETOROLAC TROMETHAMINE 15 MG/ML IJ SOLN
15.0000 mg | Freq: Four times a day (QID) | INTRAMUSCULAR | Status: AC
  Administered 2022-01-07 – 2022-01-12 (×20): 15 mg via INTRAVENOUS
  Filled 2022-01-07 (×20): qty 1

## 2022-01-07 MED ORDER — PROCHLORPERAZINE EDISYLATE 10 MG/2ML IJ SOLN
10.0000 mg | Freq: Once | INTRAMUSCULAR | Status: AC
  Administered 2022-01-07: 10 mg via INTRAVENOUS
  Filled 2022-01-07: qty 2

## 2022-01-07 NOTE — Progress Notes (Signed)
Pt was not able to tolerate the NG tube placement x 2. Pt is tearful and yelling for [place] to stop. Nurse tried to insert but pt is just unable to tolerate. Provider paged.

## 2022-01-07 NOTE — Progress Notes (Signed)
   General Surgery Follow Up Note  Subjective:    Overnight Issues:   Objective:  Vital signs for last 24 hours: Temp:  [98.4 F (36.9 C)-99.2 F (37.3 C)] 99 F (37.2 C) (07/03 0829) Pulse Rate:  [89-107] 107 (07/03 0829) Resp:  [15-20] 16 (07/03 0908) BP: (138-157)/(77-83) 143/81 (07/03 0829) SpO2:  [94 %-98 %] 94 % (07/03 0908) FiO2 (%):  [21 %] 21 % (07/03 0800)  Hemodynamic parameters for last 24 hours:    Intake/Output from previous day: 07/02 0701 - 07/03 0700 In: 1777 [I.V.:1335.9; IV Piggyback:441.1] Out: 2500 [Urine:1200; Stool:1300]  Intake/Output this shift: Total I/O In: -  Out: 400 [Urine:175; Stool:225]  Vent settings for last 24 hours: FiO2 (%):  [21 %] 21 %  Physical Exam:  Gen: comfortable, no distress Neuro: non-focal exam HEENT: PERRL Neck: supple CV: RRR Pulm: unlabored breathing Abd: soft, appropriately TTP, incision with minimal drainage, ostomy productive GU: clear yellow urine Extr: wwp, no edema   Results for orders placed or performed during the hospital encounter of 01/03/22 (from the past 24 hour(s))  Basic metabolic panel     Status: Abnormal   Collection Time: 01/07/22  4:32 AM  Result Value Ref Range   Sodium 140 135 - 145 mmol/L   Potassium 2.8 (L) 3.5 - 5.1 mmol/L   Chloride 104 98 - 111 mmol/L   CO2 25 22 - 32 mmol/L   Glucose, Bld 110 (H) 70 - 99 mg/dL   BUN 11 8 - 23 mg/dL   Creatinine, Ser 0.72 0.61 - 1.24 mg/dL   Calcium 8.8 (L) 8.9 - 10.3 mg/dL   GFR, Estimated >60 >60 mL/min   Anion gap 11 5 - 15  CBC     Status: Abnormal   Collection Time: 01/07/22  4:32 AM  Result Value Ref Range   WBC 9.3 4.0 - 10.5 K/uL   RBC 4.39 4.22 - 5.81 MIL/uL   Hemoglobin 10.8 (L) 13.0 - 17.0 g/dL   HCT 33.7 (L) 39.0 - 52.0 %   MCV 76.8 (L) 80.0 - 100.0 fL   MCH 24.6 (L) 26.0 - 34.0 pg   MCHC 32.0 30.0 - 36.0 g/dL   RDW 16.4 (H) 11.5 - 15.5 %   Platelets 342 150 - 400 K/uL   nRBC 0.2 0.0 - 0.2 %    Assessment &  Plan:  Present on Admission:  Large bowel obstruction (HCC)  Alcohol use  Hypokalemia    LOS: 4 days   Additional comments:I reviewed the patient's new clinical lab test results.   and I reviewed the patients new imaging test results.    Colon obstruction due to sigmoid mass, also with thickening of splenic flexure - s/p extended L hemicolectomy, path pending. Still has an ileus. Replace NGT, ambulate, adjust pain meds to minimize narcs. Hypokalemia - replete, check mag/phos FEN - NPO, NGT DVT - SCDs, recommend initiation of LMWH Dispo - medsurg    Jesusita Oka, MD Trauma & General Surgery Please use [email or web address] to contact on call provider  01/07/2022  *Care during the described time interval was provided by me. I have reviewed this patient's available data, including medical history, events of note, physical examination and test results as part of my evaluation.

## 2022-01-07 NOTE — Progress Notes (Signed)
G-tube Progress Note Patient: Carl Barr CZY:[id number] DOB: April 24, 1956 DOA: 01/03/2022  DOS: the patient was seen and examined on 01/07/2022  Brief hospital course: 66 year old male without any past medical history presents to the ER with complaints of constipation with nausea and vomiting.  Found to have bowel obstruction with colonic stricture concerning for possible malignancy. S/P Left sigmoid and distal transverse colectomy and colostomy creation on 6/30. 7/3, developed ileus with nausea and vomiting.  Continues to have output from the colostomy bag.  Unable to insert NG x2.  General surgery aware.  PCA discontinued. Assessment and Plan: Large bowel obstruction Presents with constipation/obstipation.  CT abdomen shows evidence of 2 strictures with small bowel obstruction. Concern for malignancy. General surgery was consulted. Underwent left sigmoid and distal transverse colectomy as well as colostomy creation on 6/30. Currently on IV fluid and Dilaudid PCA.  NG tube removed 7/2. Started to have nausea and vomiting on 7/3.  X-ray abdomen shows evidence of postop ileus.  NG tube insertion x2 not successful.  General surgery aware.  Nausea and vomiting currently resolved.  Will monitor. Discontinue PCA.  Minimize narcotics.  Continue with IV fluids. Highly appreciate surgery  Hypokalemia Hypomagnesemia.  Hypophosphatemia. Replaced.  Monitor   Alcohol use Reported history of alcohol use although no quantity mentioned.  Patient continues to deny any alcohol intake. Monitor for now.  Postop Foley catheter. We will remove.  Discussed with surgery.  Subjective: Multiple episodes of nausea 1 episode of vomiting.  No abdominal pain right now.  Still having output from the colostomy bag.  Physical Exam: Vitals:   01/07/22 0800 01/07/22 0829 01/07/22 0908 01/07/22 1204  BP:  (!) 143/81  133/84  Pulse:  (!) 107  (!) 110  Resp: '16 16 16   '$ Temp:  99 F (37.2 C)    TempSrc:  Oral     SpO2: 95% 95% 94%   Weight:      Height:       General: Appear in mild distress, no Rash; Oral Mucosa Clear, moist. no Abnormal Neck Mass Or lumps, Conjunctiva normal  Cardiovascular: S1 and S2 Present, no Murmur, Respiratory: good respiratory effort, Bilateral Air entry present and CTA, no Crackles, no wheezes Abdomen: Bowel Sound sluggish, Soft and mild surgical site tenderness Extremities: no Pedal edema Neurology: alert and oriented to time, place, and person affect appropriate. no new focal deficit Gait not checked due to patient safety concerns   Data Reviewed: I have Reviewed nursing notes, Vitals, and Lab results since pt's last encounter. Pertinent lab results CBC and BMP I have ordered test including CBC and BMP and magnesium and phosphorus I have discussed pt's care plan and test results with general surgery.    Family Communication: None at bedside  Disposition: Status is: Inpatient Remains inpatient appropriate because: Direct postop ileus.  Now receiving therapy for the same.  Author: Berle Mull, MD 01/07/2022 6:53 PM  Please look on [email or web address] to find out who is on call.

## 2022-01-07 NOTE — Progress Notes (Signed)
Pt's PCA pump d/c, wasted 31m dilaudid with MPrentiss Bells RTherapist, sports

## 2022-01-07 NOTE — Progress Notes (Signed)
Jasmin RN, attempted NG tube 2x not successful patient was not cooperating and resisting. Patient is tearful and yelling this he does not want to do this and he wants [place] to stop.

## 2022-01-07 NOTE — TOC Initial Note (Signed)
Transition of Care [facility]) - Initial/Assessment Note    Patient Details  Name: Carl Barr MRN: [id number] Date of Birth: Jan 28, 1956  Transition of Care [facility]) CM/SW Contact:    Marilu Favre, RN Phone Number: 01/07/2022, 2:53 PM  Clinical Narrative:                 Confirmed face sheet information with patient. Patient from home with cousin Elberta Fortis.   Discussed pending PT eval. Discussed HHRN for continued ostomy care education.   Explained patient will need to learn how to do ostomy care . [place] nurse will enroll him in ostomy supply program   Patient has no DME at home.   TOC will continue to follow.   Cindie with [facility] accepted referral for [facility].  Await PT eval to see patient has need for any DME / HHPT   Expected Discharge Plan: [place] Barriers to Discharge: Continued Medical Work up   Patient Goals and CMS Choice Patient states their goals for this hospitalization and ongoing recovery are:: to return to home CMS Medicare.gov Compare Post Acute Care list provided to:: Patient Choice offered to / list presented to : Patient  Expected Discharge Plan and Services Expected Discharge Plan: [place]   Discharge Planning Services: CM Consult Post Acute Care Choice: [place] arrangements for the past 2 months: Single Family Home                   DME Agency:  (Await PT eval)       HH Arranged: RN (Await PT eval) HH Agency: [place]        Prior Living Arrangements/Services Living arrangements for the past 2 months: Single Family Home Lives with:: Relatives (cousin) Patient language and need for interpreter reviewed:: Yes Do you feel safe going back to the place where you live?: Yes      Need for Family Participation in Patient Care: Yes (Comment) Care giver support system in place?: Yes (comment)   Criminal Activity/Legal Involvement Pertinent to Current Situation/Hospitalization: No -  Comment as needed  Activities of Daily Living Home Assistive Devices/Equipment: None ADL Screening (condition at time of admission) Patient's cognitive ability adequate to safely complete daily activities?: Yes Is the patient deaf or have difficulty hearing?: No Does the patient have difficulty seeing, even when wearing glasses/contacts?: No Does the patient have difficulty concentrating, remembering, or making decisions?: No Patient able to express need for assistance with ADLs?: Yes Does the patient have difficulty dressing or bathing?: No Independently performs ADLs?: Yes (appropriate for developmental age) Does the patient have difficulty walking or climbing stairs?: Yes Weakness of Legs: None Weakness of Arms/Hands: None  Permission Sought/Granted   Permission granted to share information with : No              Emotional Assessment Appearance:: Appears stated age Attitude/Demeanor/Rapport: Engaged Affect (typically observed): Accepting Orientation: : Oriented to Self, Oriented to Place, Oriented to  Time, Oriented to Situation Alcohol / Substance Use: Not Applicable Psych Involvement: No (comment)  Admission diagnosis:  SBO (small bowel obstruction) ([place]) [K56.609] Large bowel obstruction ([place]) [K56.609] Patient Active Problem List   Diagnosis Date Noted   Alcohol use 01/04/2022   Hypokalemia 01/04/2022   Large bowel obstruction ([place]) 01/03/2022   Lumbar degenerative disc disease 12/16/2018   Chronic pain of left knee 12/16/2018   Encounter for long-term (current) use of medications 12/16/2018   PCP:  Fulp,  Cammie, MD Pharmacy:   [facility] [address] [place] [address] Phone: [phone] Fax: [phone]     Social Determinants of Health (SDOH) Interventions    Readmission Risk Interventions     No data to display

## 2022-01-07 NOTE — Consult Note (Addendum)
[place] Nurse ostomy consult note Patient receiving care in [facility] 6N11 Stoma type/location: LLQ colostomy Stomal assessment/size: 2 1/2 inches, pink, moist just above skin level Peristomal assessment: sutures intact Treatment options for stomal/peristomal skin: barrier ring Output : yellow mucous Ostomy pouching: 2pc. 2 3/4 inch pouch Kellie Simmering # [phone]) skin barrier Kellie Simmering #2) Barrier ring Kellie Simmering # [phone]) Education provided: None today. Patient sleepy and no family members present. States he lives with a cousin. Educational materials left in room.  Enrolled patient in [place] program: Yes  Welcome to [employer]! Thank you for choosing to enroll. A member of our team will be in touch shortly to explain our services, which include:  Personalized support from a [place], supplier, and insurance information Condition-specific information and connection to community resources If you have questions now, or in the future, please call us at [phone].  Thank you for the consult. [place] nurse will follow for teaching and education   Please re-consult the [place] team if needed.  Cathlean Marseilles [name], MSN, RN, [place], Lysle Pearl, [facility] Wound Treatment Associate Pager [phone]

## 2022-01-08 DIAGNOSIS — K56609 Unspecified intestinal obstruction, unspecified as to partial versus complete obstruction: Secondary | ICD-10-CM | POA: Diagnosis not present

## 2022-01-08 LAB — BASIC METABOLIC PANEL
Anion gap: 10 (ref 5–15)
BUN: 11 mg/dL (ref 8–23)
CO2: 27 mmol/L (ref 22–32)
Calcium: 8.6 mg/dL — ABNORMAL LOW (ref 8.9–10.3)
Chloride: 102 mmol/L (ref 98–111)
Creatinine, Ser: 0.66 mg/dL (ref 0.61–1.24)
GFR, Estimated: >60 mL/min (ref >60)
Glucose, Bld: 114 mg/dL — ABNORMAL HIGH (ref 70–99)
Potassium: 3.1 mmol/L — ABNORMAL LOW (ref 3.5–5.1)
Sodium: 139 mmol/L (ref 135–145)

## 2022-01-08 LAB — PHOSPHORUS: Phosphorus: 2.5 mg/dL (ref 2.5–4.6)

## 2022-01-08 MED ORDER — POTASSIUM CHLORIDE 10 MEQ/100ML IV SOLN
10.0000 meq | Freq: Once | INTRAVENOUS | Status: AC
Start: 2022-01-08 — End: 2022-01-08
  Administered 2022-01-08: 10 meq via INTRAVENOUS

## 2022-01-08 MED ORDER — POTASSIUM CHLORIDE 10 MEQ/100ML IV SOLN
10.0000 meq | INTRAVENOUS | Status: AC
  Administered 2022-01-08 (×4): 10 meq via INTRAVENOUS
  Filled 2022-01-08 (×4): qty 100

## 2022-01-08 NOTE — Plan of Care (Signed)
  Problem: Pain Managment: Goal: General experience of comfort will improve Outcome: Progressing   Problem: Skin Integrity: Goal: Risk for impaired skin integrity will decrease Outcome: Progressing   Problem: Safety: Goal: Ability to remain free from injury will improve Outcome: Progressing   

## 2022-01-08 NOTE — Evaluation (Signed)
Physical Therapy Evaluation Patient Details Name: Carl Barr MRN: [id number] DOB: [birth year] Today's Date: 01/08/2022  History of Present Illness  66 year old male without any past medical history presents to the ER with complaints of constipation with nausea and vomiting.  Found to have bowel obstruction with colonic stricture concerning for possible malignancy.  S/P Left sigmoid and distal transverse colectomy and colostomy creation on 6/30.  7/3, developed ileus with nausea and vomiting.  Continues to have output from the colostomy bag.  Unable to insert NG x2.  Clinical Impression  Pt admitted with above diagnosis. Pt was able to pivot to chair but with poor postural stability and need for mod assist for safety with rW.  Pt incontinent of urine as well.  May need SNF for therapy prior to d/c home as he lives alone and doesn't have anyone to help him.   Pt currently with functional limitations due to the deficits listed below (see PT Problem List). Pt will benefit from skilled PT to increase their independence and safety with mobility to allow discharge to the venue listed below.          Recommendations for follow up therapy are one component of a multi-disciplinary discharge planning process, led by the attending physician.  Recommendations may be updated based on patient status, additional functional criteria and insurance authorization.  Follow Up Recommendations Skilled nursing-short term rehab (<3 hours/day) Can patient physically be transported by private vehicle: Yes    Assistance Recommended at Discharge Intermittent Supervision/Assistance  Patient can return home with the following  A little help with walking and/or transfers;A little help with bathing/dressing/bathroom;Assistance with cooking/housework;Direct supervision/assist for medications management;Assist for transportation;Help with stairs or ramp for entrance    Equipment Recommendations Rolling walker (2  wheels);BSC/3in1  Recommendations for Other Services       Functional Status Assessment Patient has had a recent decline in their functional status and demonstrates the ability to make significant improvements in function in a reasonable and predictable amount of time.     Precautions / Restrictions Precautions Precautions: Fall Precaution Comments: colostomy Restrictions Weight Bearing Restrictions: No      Mobility  Bed Mobility Overal bed mobility: Needs Assistance Bed Mobility: Supine to Sit     Supine to sit: Min assist     General bed mobility comments: Needed assist and cues to come to EOB with incr time to get left hip to EOB needing to use pad.  Pt with slow processing of commands as well.    Transfers Overall transfer level: Needs assistance Equipment used: Rolling walker (2 wheels) Transfers: Sit to/from Stand, Bed to chair/wheelchair/BSC Sit to Stand: Mod assist Stand pivot transfers: Min assist         General transfer comment: Pt stood to RW with mod assist not stanidng all the way upright. Took pivotal steps to the recliner with min assit and cues as he was flexed at trunk. Pt was able incontinent of urine. Cleaned pt and changed his gown once he was in chair.  Sats >92% on RA and 98% on 2.5L.    Ambulation/Gait                  Stairs            Wheelchair Mobility    Modified Rankin (Stroke Patients Only)       Balance Overall balance assessment: Needs assistance Sitting-balance support: No upper extremity supported, Feet supported Sitting balance-Leahy Scale: Fair     Standing balance support:  Bilateral upper extremity supported, During functional activity Standing balance-Leahy Scale: Poor Standing balance comment: relies on UE support                             Pertinent Vitals/Pain Pain Assessment Pain Assessment: Faces Faces Pain Scale: Hurts even more Pain Location: stomach Pain Descriptors /  Indicators: Aching, Grimacing, Guarding Pain Intervention(s): Limited activity within patient's tolerance, Monitored during session, Repositioned    Home Living Family/patient expects to be discharged to:: Private residence Living Arrangements: Alone Available Help at Discharge: Friend(s);Available PRN/intermittently (states he lives in a house with other men) Type of Home: House Home Access: Level entry       Home Layout: One level Home Equipment: None      Prior Function Prior Level of Function : Independent/Modified Independent                     Hand Dominance        Extremity/Trunk Assessment   Upper Extremity Assessment Upper Extremity Assessment: Defer to OT evaluation    Lower Extremity Assessment Lower Extremity Assessment: Generalized weakness    Cervical / Trunk Assessment Cervical / Trunk Assessment: Kyphotic  Communication   Communication: No difficulties  Cognition Arousal/Alertness: Awake/alert Behavior During Therapy: Flat affect Overall Cognitive Status: Impaired/Different from baseline Area of Impairment: Following commands, Safety/judgement, Awareness, Problem solving                       Following Commands: Follows one step commands with increased time Safety/Judgement: Decreased awareness of safety, Decreased awareness of deficits   Problem Solving: Difficulty sequencing, Requires verbal cues, Decreased initiation, Slow processing          General Comments      Exercises     Assessment/Plan    PT Assessment Patient needs continued PT services  PT Problem List Decreased activity tolerance;Decreased balance;Decreased mobility;Decreased knowledge of use of DME;Decreased safety awareness;Cardiopulmonary status limiting activity;Decreased knowledge of precautions;Pain       PT Treatment Interventions DME instruction;Gait training;Functional mobility training;Therapeutic activities;Therapeutic exercise;Balance  training;Patient/family education    PT Goals (Current goals can be found in the Care Plan section)  Acute Rehab PT Goals Patient Stated Goal: to go home PT Goal Formulation: With patient Time For Goal Achievement: 01/22/22 Potential to Achieve Goals: Good    Frequency Min 3X/week     Co-evaluation               AM-PAC PT "6 Clicks" Mobility  Outcome Measure Help needed turning from your back to your side while in a flat bed without using bedrails?: A Little Help needed moving from lying on your back to sitting on the side of a flat bed without using bedrails?: A Lot Help needed moving to and from a bed to a chair (including a wheelchair)?: A Lot Help needed standing up from a chair using your arms (e.g., wheelchair or bedside chair)?: A Lot Help needed to walk in hospital room?: Total Help needed climbing 3-5 steps with a railing? : Total 6 Click Score: 11    End of Session Equipment Utilized During Treatment: Gait belt;Oxygen Activity Tolerance: Patient limited by fatigue;Patient limited by pain Patient left: in chair;with call bell/phone within reach;with chair alarm set Nurse Communication: Mobility status PT Visit Diagnosis: Unsteadiness on feet (R26.81);Muscle weakness (generalized) (M62.81)    Time: 6295-2841 PT Time Calculation (min) (ACUTE ONLY): 22 min  Charges:   PT Evaluation $PT Eval Moderate Complexity: 1 Mod          Shakeela Rabadan M,PT Acute Rehab Services [phone]   Alvira Philips 01/08/2022, 12:10 PM

## 2022-01-08 NOTE — Progress Notes (Signed)
  Progress Note Patient: Carl Barr PYP:[id number] DOB: 28-May-1956 DOA: 01/03/2022  DOS: the patient was seen and examined on 01/08/2022  Brief hospital course: 66 year old male without any past medical history presents to the ER with complaints of constipation with nausea and vomiting.  Found to have bowel obstruction with colonic stricture concerning for possible malignancy. S/P Left sigmoid and distal transverse colectomy and colostomy creation on 6/30. 7/3, developed ileus with nausea and vomiting.  Continues to have output from the colostomy bag.  Unable to insert NG x2.  General surgery aware.  PCA discontinued. Assessment and Plan: Large bowel obstruction S/P colectomy and colostomy creation Presents with constipation/obstipation.  CT abdomen shows evidence of 2 strictures with small bowel obstruction. Concern for malignancy. General surgery was consulted. Underwent left sigmoid and distal transverse colectomy as well as colostomy creation on 6/30. Patient was on IV fluid and Dilaudid PCA. NG tube was removed on 7/2. Started to have nausea and vomiting on 7/3.  X-ray abdomen shows evidence of postop ileus.  NG tube insertion x2 not successful. Dilaudid PCA was continued with the plan for minimizing narcotics. Continue with IV fluids Surgery recommends ice chips on 7/4. Repeat x-ray tomorrow.   Hypokalemia Hypomagnesemia.  Hypophosphatemia. Replaced.  Monitor   Alcohol use Reported history of alcohol use although no quantity mentioned.  Patient continues to deny any alcohol intake. Monitor for now.   Postop Foley catheter. Removed on 7/3.  Subjective: No nausea no vomiting.  No fever no chills.  No abdominal pain.  Slept okay last night.  Abdominal distention improving.  Wants to have ice chips.  Physical Exam: Vitals:   01/08/22 0303 01/08/22 0434 01/08/22 0732 01/08/22 1556  BP: (!) 148/92 134/86 109/77 139/87  Pulse: (!) 106 96 (!) 104 97  Resp: '14  17 19  '$ Temp: 98.3  F (36.8 C) 98 F (36.7 C) 98.4 F (36.9 C) 98.7 F (37.1 C)  TempSrc: Oral Oral Oral Oral  SpO2: 99% 98% 97% 93%  Weight:      Height:       General: Appear in mild distress; no visible Abnormal Neck Mass Or lumps, Conjunctiva normal Cardiovascular: S1 and S2 Present, no Murmur, Respiratory: good respiratory effort, Bilateral Air entry present and CTA, no Crackles, no wheezes Abdomen: Bowel Sound present, Non tender Extremities: no Pedal edema Neurology: alert and oriented to time, place, and person  Gait not checked due to patient safety concerns   Data Reviewed: I have Reviewed nursing notes, Vitals, and Lab results since pt's last encounter. Pertinent lab results CBC and BMP and magnesium I have ordered test including CBC and BMP and magnesium    Family Communication: Cousin at bedside  Disposition: Status is: Inpatient Remains inpatient appropriate because: Need for monitoring for tolerance of oral diet postoperatively.  Author: Berle Mull, MD 01/08/2022 7:21 PM  Please look on [email or web address] to find out who is on call.

## 2022-01-08 NOTE — Progress Notes (Addendum)
Patient did report feeling nauseous, no emesis during my shift. He has stated several that he wants something to drink. I educated him on why he is NPO.He also keeps telling me that he will not have that tube down his nose ever again.    0708 Patient did have emesis unable to chart actual volume. Went to check on patient and saw emesis that look like bile on patients gown.

## 2022-01-08 NOTE — Progress Notes (Signed)
4 Days Post-Op   Subjective/Chief Complaint: Pt refuses NGT no more emesis    Objective: Vital signs in last 24 hours: Temp:  [98 F (36.7 C)-98.9 F (37.2 C)] 98.4 F (36.9 C) (07/04 0732) Pulse Rate:  [91-110] 104 (07/04 0732) Resp:  [14-17] 17 (07/04 0732) BP: (109-148)/(77-92) 109/77 (07/04 0732) SpO2:  [95 %-99 %] 97 % (07/04 0732) Last BM Date : 01/07/22  Intake/Output from previous day: 07/03 0701 - 07/04 0700 In: 3944.6 [I.V.:3560.2; IV Piggyback:384.3] Out: 2475 [Urine:975; Emesis/NG output:400; Stool:1100] Intake/Output this shift: Total I/O In: -  Out: 300 [Urine:300]  Gen: comfortable, no distress Neuro: non-focal exam HEENT: PERRL Neck: supple CV: RRR Pulm: unlabored breathing Abd: soft, appropriately TTP, incision with minimal drainage, ostomy productive Extr: wwp, no edema  Lab Results:  Recent Labs    01/06/22 0017 01/07/22 0432  WBC 9.1 9.3  HGB 11.0* 10.8*  HCT 34.1* 33.7*  PLT 307 342   BMET Recent Labs    01/07/22 1700 01/08/22 0058  NA 139 139  K 3.2* 3.1*  CL 102 102  CO2 25 27  GLUCOSE 100* 114*  BUN 13 11  CREATININE 0.81 0.66  CALCIUM 8.8* 8.6*   PT/INR No results for input(s): "LABPROT", "INR" in the last 72 hours. ABG No results for input(s): "PHART", "HCO3" in the last 72 hours.  Invalid input(s): "PCO2", "PO2"  Studies/Results: DG Abd 1 View  Result Date: 01/07/2022 CLINICAL DATA:  Encounter for NG tube EXAM: ABDOMEN - 1 VIEW COMPARISON:  CT 01/03/2022. FINDINGS: Nasogastric tube is coiled in the mid chest with tip coursing cephalad outside of the field of view. There is gaseous distention of bowel in the upper abdomen. Midline surgical staples noted. Low lung volumes with elevated right hemidiaphragm and right basilar atelectasis. IMPRESSION: Nasogastric tube coils in the mid chest with tip coursing cephalad outside of the field of view. Recommend repositioning/replacement. Gaseous distension of bowel in the upper  abdomen compatible with postoperative ileus. Recommend continued radiographic follow-up. These results will be called to the ordering clinician or representative by the Radiologist Assistant, and communication documented in the PACS or [employer]. Electronically Signed   By: Maurine Simmering M.D.   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 Still has an ileus. Replace NGT, ambulate, adjust pain meds to minimize narcs. Hypokalemia - replete, check mag/phos FEN - NPO but allow ice , pt refuses NGT  DVT - SCDs, recommend initiation of LMWH Dispo - medsurg   LOS: 5 days    Turner Daniels MD  01/08/2022

## 2022-01-09 ENCOUNTER — Inpatient Hospital Stay (HOSPITAL_COMMUNITY): Payer: Medicare Other

## 2022-01-09 DIAGNOSIS — K56609 Unspecified intestinal obstruction, unspecified as to partial versus complete obstruction: Secondary | ICD-10-CM | POA: Diagnosis not present

## 2022-01-09 LAB — CBC WITH DIFFERENTIAL/PLATELET
Abs Immature Granulocytes: 1.63 10*3/uL — ABNORMAL HIGH (ref 0.00–0.07)
Basophils Absolute: 0 10*3/uL (ref 0.0–0.1)
Basophils Relative: 0 %
Eosinophils Absolute: 0.2 10*3/uL (ref 0.0–0.5)
Eosinophils Relative: 2 %
HCT: 31.5 % — ABNORMAL LOW (ref 39.0–52.0)
Hemoglobin: 10.1 g/dL — ABNORMAL LOW (ref 13.0–17.0)
Immature Granulocytes: 11 %
Lymphocytes Relative: 10 %
Lymphs Abs: 1.5 10*3/uL (ref 0.7–4.0)
MCH: 24.8 pg — ABNORMAL LOW (ref 26.0–34.0)
MCHC: 32.1 g/dL (ref 30.0–36.0)
MCV: 77.2 fL — ABNORMAL LOW (ref 80.0–100.0)
Monocytes Absolute: 1.8 10*3/uL — ABNORMAL HIGH (ref 0.1–1.0)
Monocytes Relative: 12 %
Neutro Abs: 9.3 10*3/uL — ABNORMAL HIGH (ref 1.7–7.7)
Neutrophils Relative %: 65 %
Platelets: 348 10*3/uL (ref 150–400)
RBC: 4.08 MIL/uL — ABNORMAL LOW (ref 4.22–5.81)
RDW: 16.9 % — ABNORMAL HIGH (ref 11.5–15.5)
Smear Review: NORMAL
WBC: 14.4 10*3/uL — ABNORMAL HIGH (ref 4.0–10.5)
nRBC: 0.3 % — ABNORMAL HIGH (ref 0.0–0.2)

## 2022-01-09 LAB — COMPREHENSIVE METABOLIC PANEL
ALT: 16 U/L (ref 0–44)
AST: 19 U/L (ref 15–41)
Albumin: 2 g/dL — ABNORMAL LOW (ref 3.5–5.0)
Alkaline Phosphatase: 39 U/L (ref 38–126)
Anion gap: 8 (ref 5–15)
BUN: 11 mg/dL (ref 8–23)
CO2: 25 mmol/L (ref 22–32)
Calcium: 8.3 mg/dL — ABNORMAL LOW (ref 8.9–10.3)
Chloride: 106 mmol/L (ref 98–111)
Creatinine, Ser: 0.65 mg/dL (ref 0.61–1.24)
GFR, Estimated: >60 mL/min (ref >60)
Glucose, Bld: 89 mg/dL (ref 70–99)
Potassium: 2.9 mmol/L — ABNORMAL LOW (ref 3.5–5.1)
Sodium: 139 mmol/L (ref 135–145)
Total Bilirubin: 1 mg/dL (ref 0.3–1.2)
Total Protein: 6.2 g/dL — ABNORMAL LOW (ref 6.5–8.1)

## 2022-01-09 LAB — MAGNESIUM: Magnesium: 2 mg/dL (ref 1.7–2.4)

## 2022-01-09 LAB — BASIC METABOLIC PANEL
Anion gap: 13 (ref 5–15)
BUN: 10 mg/dL (ref 8–23)
CO2: 22 mmol/L (ref 22–32)
Calcium: 8.7 mg/dL — ABNORMAL LOW (ref 8.9–10.3)
Chloride: 104 mmol/L (ref 98–111)
Creatinine, Ser: 0.69 mg/dL (ref 0.61–1.24)
GFR, Estimated: >60 mL/min (ref >60)
Glucose, Bld: 87 mg/dL (ref 70–99)
Potassium: 4.2 mmol/L (ref 3.5–5.1)
Sodium: 139 mmol/L (ref 135–145)

## 2022-01-09 MED ORDER — IOHEXOL 300 MG/ML  SOLN
100.0000 mL | Freq: Once | INTRAMUSCULAR | Status: AC | PRN
  Administered 2022-01-09: 100 mL via INTRAVENOUS

## 2022-01-09 MED ORDER — IOHEXOL 9 MG/ML PO SOLN
ORAL | Status: AC
  Administered 2022-01-09: 500 mL
  Filled 2022-01-09: qty 1000

## 2022-01-09 MED ORDER — ACETAMINOPHEN 10 MG/ML IV SOLN
1000.0000 mg | Freq: Four times a day (QID) | INTRAVENOUS | Status: AC
Start: 2022-01-09 — End: 2022-01-11
  Administered 2022-01-09 – 2022-01-10 (×4): 1000 mg via INTRAVENOUS
  Filled 2022-01-09 (×4): qty 100

## 2022-01-09 MED ORDER — POTASSIUM CHLORIDE 10 MEQ/100ML IV SOLN
10.0000 meq | INTRAVENOUS | Status: AC
  Administered 2022-01-09 (×6): 10 meq via INTRAVENOUS
  Filled 2022-01-09 (×6): qty 100

## 2022-01-09 NOTE — Progress Notes (Signed)
Mobility Specialist Progress Note   01/09/22 1500  Mobility  Activity Transferred from chair to bed  Level of Assistance +2 (takes two people) Producer, television/film/video)  Geographical information systems officer  Distance Ambulated (ft) 2 ft  Activity Response Tolerated well  $Mobility charge 1 Mobility   Session limited d/t pt needing to be transported for CT scan. Pt requiring +2A d/t impulsiveness and safety but physical assist was at min G. Verbal cues also needed for sequencing. For future session I would suggest a chair follow when progressing ambulation. Pt also incontinent during entire transfer. Returned to bed w/o fault and left supine in bed w/ transport in room ready to take pt for scan.  Holland Falling Mobility Specialist MS [facility] #:  [phone] Acute Rehab Office:  [phone]

## 2022-01-09 NOTE — Plan of Care (Signed)
  Problem: Education: Goal: Knowledge of General Education information will improve Description Including pain rating scale, medication(s)/side effects and non-pharmacologic comfort measures Outcome: Progressing   Problem: Health Behavior/Discharge Planning: Goal: Ability to manage health-related needs will improve Outcome: Progressing   

## 2022-01-09 NOTE — Assessment & Plan Note (Signed)
-   No further concern for any withdrawal given prolonged hospitalization

## 2022-01-09 NOTE — Assessment & Plan Note (Signed)
-   Replete as needed 

## 2022-01-09 NOTE — Assessment & Plan Note (Addendum)
-   Replete as needed 

## 2022-01-09 NOTE — Progress Notes (Signed)
Progress Note    Carl Barr   [name]  DOB: 09-18-1955  DOA: 01/03/2022     6 PCP: Antony Blackbird, MD  Initial CC: N/V  Hospital Course: Carl Barr is a 66 yo male with PMH lumbar DDD who presented with N/V and inability to pass gas/bowel movements.  CT abdomen/pelvis on admission noted 2 areas of stricture in the colon involving the splenic flexure and sigmoid colon.  Area was concerning for malignancy.  He was evaluated by general surgery.  He underwent left sigmoid and distal transverse colectomy with creation of colostomy on 01/04/2022.  He then developed a postop ileus with ongoing high output.  Interval History:  Sitting up in chair this morning. Has not ambulated really since admission. Denies abdominal pain and has no further N/V. Hungry and wishes to eat.   Assessment and Plan: * Large bowel obstruction (HCC) - CT abdomen/pelvis on admission noted 2 areas of stricture in the colon involving the splenic flexure and sigmoid colon. -Underwent sigmoid and distal transverse colectomy with creation of colostomy on 01/04/2022 -continues to have high output liquid stool from ostomy with ongoing concern for ileus - Follow-up surgical pathology as well - General surgery managing, appreciate assistance  Hypokalemia - Replete as needed  Hypophosphatemia - Replete as needed  Hypomagnesemia - Replete as needed   Alcohol use - No further concern for any withdrawal given prolonged hospitalization   Old records reviewed in assessment of this patient  Antimicrobials:   DVT prophylaxis:  enoxaparin (LOVENOX) injection 40 mg Start: 01/07/22 1130 SCDs Start: 01/04/22 0616   Code Status:   Code Status: Full Code  Mobility Assessment (last 72 hours)     Mobility Assessment     Row Name 01/09/22 1100 01/09/22 0726 01/08/22 2150 01/08/22 1200 01/08/22 0854   Does patient have an order for bedrest or is patient medically unstable No - Continue assessment No - Continue  assessment No - Continue assessment -- No - Continue assessment   What is the highest level of mobility based on the progressive mobility assessment? Level 4 (Walks with assist in room) - Balance while marching in place and cannot step forward and back - Complete Level 3 (Stands with assist) - Balance while standing  and cannot march in place Level 3 (Stands with assist) - Balance while standing  and cannot march in place Level 3 (Stands with assist) - Balance while standing  and cannot march in place Level 2 (Chairfast) - Balance while sitting on edge of bed and cannot stand   Is the above level different from baseline mobility prior to current illness? -- -- Yes - Recommend PT order -- Yes - Recommend PT order    Row Name 01/08/22 0853 01/07/22 0800 01/06/22 2004       Does patient have an order for bedrest or is patient medically unstable No - Continue assessment No - Continue assessment No - Continue assessment     What is the highest level of mobility based on the progressive mobility assessment? Level 2 (Chairfast) - Balance while sitting on edge of bed and cannot stand Level 3 (Stands with assist) - Balance while standing  and cannot march in place Level 3 (Stands with assist) - Balance while standing  and cannot march in place     Is the above level different from baseline mobility prior to current illness? -- Yes - Recommend PT order Yes - Recommend PT order  Disposition Plan:  SNF Status is: Inpt  Objective: Blood pressure 120/85, pulse 97, temperature 98.1 F (36.7 C), temperature source Oral, resp. rate 16, height '5\' 9"'$  (1.753 m), weight 81.6 kg, SpO2 96 %.  Examination:  Physical Exam Constitutional:      General: He is not in acute distress.    Appearance: Normal appearance.  HENT:     Head: Normocephalic and atraumatic.     Mouth/Throat:     Mouth: Mucous membranes are moist.  Eyes:     Extraocular Movements: Extraocular movements intact.  Cardiovascular:      Rate and Rhythm: Normal rate and regular rhythm.     Heart sounds: Normal heart sounds.  Pulmonary:     Effort: Pulmonary effort is normal. No respiratory distress.     Breath sounds: Normal breath sounds. No wheezing.  Abdominal:     Comments: Soft, nontender, nondistended.  Bowel sounds hypoactive.  Ostomy bag in place with liquid stool brown  Musculoskeletal:        General: Normal range of motion.     Cervical back: Normal range of motion and neck supple.  Skin:    General: Skin is warm and dry.  Neurological:     General: No focal deficit present.     Mental Status: He is alert.  Psychiatric:        Mood and Affect: Mood normal.        Behavior: Behavior normal.      Consultants:  General surgery  Procedures:  Left sigmoid and distal transverse colectomy with colostomy creation, 01/04/2022  Data Reviewed: Results for orders placed or performed during the hospital encounter of 01/03/22 (from the past 24 hour(s))  CBC with Differential/Platelet     Status: Abnormal   Collection Time: 01/09/22 12:50 AM  Result Value Ref Range   WBC 14.4 (H) 4.0 - 10.5 K/uL   RBC 4.08 (L) 4.22 - 5.81 MIL/uL   Hemoglobin 10.1 (L) 13.0 - 17.0 g/dL   HCT 31.5 (L) 39.0 - 52.0 %   MCV 77.2 (L) 80.0 - 100.0 fL   MCH 24.8 (L) 26.0 - 34.0 pg   MCHC 32.1 30.0 - 36.0 g/dL   RDW 16.9 (H) 11.5 - 15.5 %   Platelets 348 150 - 400 K/uL   nRBC 0.3 (H) 0.0 - 0.2 %   Neutrophils Relative % 65 %   Neutro Abs 9.3 (H) 1.7 - 7.7 K/uL   Lymphocytes Relative 10 %   Lymphs Abs 1.5 0.7 - 4.0 K/uL   Monocytes Relative 12 %   Monocytes Absolute 1.8 (H) 0.1 - 1.0 K/uL   Eosinophils Relative 2 %   Eosinophils Absolute 0.2 0.0 - 0.5 K/uL   Basophils Relative 0 %   Basophils Absolute 0.0 0.0 - 0.1 K/uL   WBC Morphology MILD LEFT SHIFT (1-5% METAS, OCC MYELO, OCC BANDS)    RBC Morphology See Note    Smear Review Normal platelet morphology    Immature Granulocytes 11 %   Abs Immature Granulocytes 1.63 (H)  0.00 - 0.07 K/uL   Polychromasia PRESENT    Target Cells PRESENT   Comprehensive metabolic panel     Status: Abnormal   Collection Time: 01/09/22 12:50 AM  Result Value Ref Range   Sodium 139 135 - 145 mmol/L   Potassium 2.9 (L) 3.5 - 5.1 mmol/L   Chloride 106 98 - 111 mmol/L   CO2 25 22 - 32 mmol/L   Glucose, Bld 89 70 - 99  mg/dL   BUN 11 8 - 23 mg/dL   Creatinine, Ser 0.65 0.61 - 1.24 mg/dL   Calcium 8.3 (L) 8.9 - 10.3 mg/dL   Total Protein 6.2 (L) 6.5 - 8.1 g/dL   Albumin 2.0 (L) 3.5 - 5.0 g/dL   AST 19 15 - 41 U/L   ALT 16 0 - 44 U/L   Alkaline Phosphatase 39 38 - 126 U/L   Total Bilirubin 1.0 0.3 - 1.2 mg/dL   GFR, Estimated >60 >60 mL/min   Anion gap 8 5 - 15  Magnesium     Status: None   Collection Time: 01/09/22 12:50 AM  Result Value Ref Range   Magnesium 2.0 1.7 - 2.4 mg/dL    I have Reviewed nursing notes, Vitals, and Lab results since pt's last encounter. Pertinent lab results : see above I have ordered test including BMP, CBC, Mg I have reviewed the last note from staff over past 24 hours I have discussed pt's care plan and test results with nursing staff, case manager   LOS: 6 days   Dwyane Dee, MD [facility] 01/09/2022, 3:23 PM

## 2022-01-09 NOTE — Progress Notes (Signed)
Progress Note  5 Days Post-Op  Subjective: Pt is a poor historian, reports he hungry but unable to clearly answer my questions about nausea or vomiting. Having high ostomy output but KUB this AM with worsening bowel dilation. Getting up to chair but does not sound like he is mobilizing more than this.   Objective: Vital signs in last 24 hours: Temp:  [98.1 F (36.7 C)-99.8 F (37.7 C)] 98.1 F (36.7 C) (07/05 0840) Pulse Rate:  [97-110] 97 (07/05 0840) Resp:  [16-19] 16 (07/05 0840) BP: (120-139)/(61-87) 120/85 (07/05 0840) SpO2:  [90 %-96 %] 96 % (07/05 0840) Last BM Date : 01/07/22  Intake/Output from previous day: 07/04 0701 - 07/05 0700 In: -  Out: 6050 [Urine:1150; Stool:4900] Intake/Output this shift: No intake/output data recorded.  PE: General: WD, obese male who is laying in bed in NAD Heart: regular, rate, and rhythm.  Lungs: Respiratory effort nonlabored Abd: soft, NT, moderately distended, +BS, incision C/D/I with staples present, ostomy viable with liquid output    Lab Results:  Recent Labs    01/07/22 0432 01/09/22 0050  WBC 9.3 14.4*  HGB 10.8* 10.1*  HCT 33.7* 31.5*  PLT 342 348   BMET Recent Labs    01/08/22 0058 01/09/22 0050  NA 139 139  K 3.1* 2.9*  CL 102 106  CO2 27 25  GLUCOSE 114* 89  BUN 11 11  CREATININE 0.66 0.65  CALCIUM 8.6* 8.3*   PT/INR No results for input(s): "LABPROT", "INR" in the last 72 hours. CMP     Component Value Date/Time   NA 139 01/09/2022 0050   NA 139 12/16/2018 1142   K 2.9 (L) 01/09/2022 0050   CL 106 01/09/2022 0050   CO2 25 01/09/2022 0050   GLUCOSE 89 01/09/2022 0050   BUN 11 01/09/2022 0050   BUN 15 12/16/2018 1142   CREATININE 0.65 01/09/2022 0050   CALCIUM 8.3 (L) 01/09/2022 0050   PROT 6.2 (L) 01/09/2022 0050   PROT 8.0 12/16/2018 1142   ALBUMIN 2.0 (L) 01/09/2022 0050   ALBUMIN 4.1 12/16/2018 1142   AST 19 01/09/2022 0050   ALT 16 01/09/2022 0050   ALKPHOS 39 01/09/2022 0050    BILITOT 1.0 01/09/2022 0050   BILITOT 0.4 12/16/2018 1142   GFRNONAA >60 01/09/2022 0050   GFRAA >60 08/01/2019 0654   Lipase     Component Value Date/Time   LIPASE 29 01/03/2022 1714       Studies/Results: DG Abd Portable 1V  Result Date: 01/09/2022 CLINICAL DATA:  Ileus EXAM: PORTABLE ABDOMEN - 1 VIEW COMPARISON:  January 07, 2022 FINDINGS: Multiple air-filled dilated small bowel loops are identified in the abdomen and pelvis. There is air identified in the rectal sigmoid colon. Midline surgical staples are identified. Degenerative joint changes of the spine are noted. IMPRESSION: Findings consistent with partial small bowel obstruction or ileus. This is worsened compared prior exam. Electronically Signed   By: Abelardo Diesel M.D.   On: 01/09/2022 08:12   DG Abd 1 View  Result Date: 01/07/2022 CLINICAL DATA:  Encounter for NG tube EXAM: ABDOMEN - 1 VIEW COMPARISON:  CT 01/03/2022. FINDINGS: Nasogastric tube is coiled in the mid chest with tip coursing cephalad outside of the field of view. There is gaseous distention of bowel in the upper abdomen. Midline surgical staples noted. Low lung volumes with elevated right hemidiaphragm and right basilar atelectasis. IMPRESSION: Nasogastric tube coils in the mid chest with tip coursing cephalad outside of the field  of view. Recommend repositioning/replacement. Gaseous distension of bowel in the upper abdomen compatible with postoperative ileus. Recommend continued radiographic follow-up. These results will be called to the ordering clinician or representative by the Radiologist Assistant, and communication documented in the PACS or [employer]. Electronically Signed   By: Maurine Simmering M.D.   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 Mg ok this AM  FEN: NPO, IVF, will need to consider TPN if not able to start diet in next 24-48 hrs VTE: LMWH ID: [id number] 6/30   ?Hx of alcohol abuse  LOS: 6 days    Norm Parcel, [facility] 01/09/2022, 9:38 AM Please see Amion for pager number during day hours 7:00am-4:30pm

## 2022-01-09 NOTE — Assessment & Plan Note (Addendum)
-   CT abdomen/pelvis on admission noted 2 areas of stricture in the colon involving the splenic flexure and sigmoid colon. -Underwent sigmoid and distal transverse colectomy with creation of colostomy on 01/04/2022 -some improvement in ostomy output in terms of decreased volume.  Surgery is advancing diet further and weaning TPN - PICC line placed on 01/11/2022 -C. difficile negative on 01/13/2022

## 2022-01-09 NOTE — Consult Note (Signed)
[place] Nurse ostomy follow up I went to see this patient to see if he is alert and willing for teaching/education on ostomy care. He seems confused and does not seem to have any desire of wanting to learn how to empty or change his pouch. He kept saying "somebody's gonna have to do something cause I can't do this" We emptied the pouch and I showed him how to open and close and empty but he didn't want to participate. I would recommend SNF at this point if this is possible.   Emptied 150cc of thin brown stool from the pouch.   Cathlean Marseilles [name], MSN, RN, [place], Lysle Pearl, [facility] Wound Treatment Associate Pager [phone]

## 2022-01-09 NOTE — Progress Notes (Signed)
Patient drank 1 bottle of contrast with lots of encouragement from various staff members. He refused to drink the 2nd bottle until he was ready to drink it. For now the 2nd bottle is on the patient's tray within reach.

## 2022-01-10 DIAGNOSIS — K56609 Unspecified intestinal obstruction, unspecified as to partial versus complete obstruction: Secondary | ICD-10-CM | POA: Diagnosis not present

## 2022-01-10 DIAGNOSIS — Z659 Problem related to unspecified psychosocial circumstances: Secondary | ICD-10-CM

## 2022-01-10 LAB — CBC WITH DIFFERENTIAL/PLATELET
Abs Immature Granulocytes: 0 10*3/uL (ref 0.00–0.07)
Basophils Absolute: 0 10*3/uL (ref 0.0–0.1)
Basophils Relative: 0 %
Eosinophils Absolute: 0.7 10*3/uL — ABNORMAL HIGH (ref 0.0–0.5)
Eosinophils Relative: 3 %
HCT: 33.2 % — ABNORMAL LOW (ref 39.0–52.0)
Hemoglobin: 11 g/dL — ABNORMAL LOW (ref 13.0–17.0)
Lymphocytes Relative: 8 %
Lymphs Abs: 2 10*3/uL (ref 0.7–4.0)
MCH: 25.2 pg — ABNORMAL LOW (ref 26.0–34.0)
MCHC: 33.1 g/dL (ref 30.0–36.0)
MCV: 76 fL — ABNORMAL LOW (ref 80.0–100.0)
Monocytes Absolute: 2 10*3/uL — ABNORMAL HIGH (ref 0.1–1.0)
Monocytes Relative: 8 %
Neutro Abs: 19.8 10*3/uL — ABNORMAL HIGH (ref 1.7–7.7)
Neutrophils Relative %: 81 %
Platelets: 376 10*3/uL (ref 150–400)
RBC: 4.37 MIL/uL (ref 4.22–5.81)
RDW: 17.1 % — ABNORMAL HIGH (ref 11.5–15.5)
WBC: 24.4 10*3/uL — ABNORMAL HIGH (ref 4.0–10.5)
nRBC: 0 /100 WBC
nRBC: 0.2 % (ref 0.0–0.2)

## 2022-01-10 LAB — BASIC METABOLIC PANEL
Anion gap: 18 — ABNORMAL HIGH (ref 5–15)
BUN: 9 mg/dL (ref 8–23)
CO2: 21 mmol/L — ABNORMAL LOW (ref 22–32)
Calcium: 9.2 mg/dL (ref 8.9–10.3)
Chloride: 102 mmol/L (ref 98–111)
Creatinine, Ser: 0.81 mg/dL (ref 0.61–1.24)
GFR, Estimated: >60 mL/min (ref >60)
Glucose, Bld: 85 mg/dL (ref 70–99)
Potassium: 2.8 mmol/L — ABNORMAL LOW (ref 3.5–5.1)
Sodium: 141 mmol/L (ref 135–145)

## 2022-01-10 LAB — MAGNESIUM: Magnesium: 2 mg/dL (ref 1.7–2.4)

## 2022-01-10 MED ORDER — POTASSIUM CHLORIDE 10 MEQ/100ML IV SOLN
10.0000 meq | INTRAVENOUS | Status: AC
  Administered 2022-01-10 (×6): 10 meq via INTRAVENOUS
  Filled 2022-01-10 (×6): qty 100

## 2022-01-10 MED ORDER — BOOST / RESOURCE BREEZE PO LIQD CUSTOM
1.0000 | Freq: Three times a day (TID) | ORAL | Status: DC
  Administered 2022-01-10 – 2022-01-13 (×5): 1 via ORAL

## 2022-01-10 NOTE — Progress Notes (Signed)
Physical Therapy Treatment Patient Details Name: Carl Barr MRN: [id number] DOB: 12-21-1955 Today's Date: 01/10/2022   History of Present Illness 66 year old male without any past medical history presents to the ER with complaints of constipation with nausea and vomiting.  Found to have bowel obstruction with colonic stricture concerning for possible malignancy.  S/P Left sigmoid and distal transverse colectomy and colostomy creation on 6/30.  7/3, developed ileus with nausea and vomiting.  Continues to have output from the colostomy bag.  Unable to insert NG x2.   PT Comments    Patient making slow progress with functional independence. He continues to require physical assistance with bed mobility. With sitting upright, patient had blood drainage from abdominal incision. Further out of bed mobility deferred. He did participate with LE exercises for strengthening in bed. Recommend to continue PT to maximize independence and decrease caregiver burden. SNF is recommended at discharge.   Recommendations for follow up therapy are one component of a multi-disciplinary discharge planning process, led by the attending physician.  Recommendations may be updated based on patient status, additional functional criteria and insurance authorization.  Follow Up Recommendations  Skilled nursing-short term rehab (<3 hours/day) Can patient physically be transported by private vehicle: Yes   Assistance Recommended at Discharge Intermittent Supervision/Assistance  Patient can return home with the following A little help with walking and/or transfers;A little help with bathing/dressing/bathroom;Assistance with cooking/housework;Direct supervision/assist for medications management;Assist for transportation;Help with stairs or ramp for entrance   Equipment Recommendations  Rolling walker (2 wheels);BSC/3in1    Recommendations for Other Services       Precautions / Restrictions Precautions Precautions:  Fall Precaution Comments: colostomy Restrictions Weight Bearing Restrictions: No     Mobility  Bed Mobility Overal bed mobility: Needs Assistance Bed Mobility: Supine to Sit     Supine to sit: Mod assist     General bed mobility comments: verbal cues for technique. assistance mostly required for trunk support    Transfers                   General transfer comment: deferred as patient having bloody drainage from abdominal incision area with sitting upright- nurse alerted. also noted increased output at ostomy as well with mobility efforts.    Ambulation/Gait                   Stairs             Wheelchair Mobility    Modified Rankin (Stroke Patients Only)       Balance                                            Cognition Arousal/Alertness: Awake/alert Behavior During Therapy: Flat affect Overall Cognitive Status: Impaired/Different from baseline                         Following Commands: Follows one step commands with increased time Safety/Judgement: Decreased awareness of safety, Decreased awareness of deficits   Problem Solving: Difficulty sequencing, Requires verbal cues, Decreased initiation, Slow processing          Exercises General Exercises - Lower Extremity Ankle Circles/Pumps: AROM, Strengthening, Both, 10 reps, Supine Heel Slides: AAROM, Strengthening, Both, 10 reps, Supine Hip ABduction/ADduction: AAROM, Strengthening, Both, 10 reps, Supine Straight Leg Raises: AAROM, Strengthening, Both, 10 reps, Supine Other Exercises Other Exercises:  verbal cues for exercise participation and technique for strengthening. no wound drainage noted with in bed exercises    General Comments        Pertinent Vitals/Pain Pain Assessment Pain Assessment: Faces Faces Pain Scale: Hurts even more Pain Location: stomach Pain Descriptors / Indicators: Discomfort Pain Intervention(s): Limited activity within  patient's tolerance, Monitored during session, Repositioned    Home Living                          Prior Function            PT Goals (current goals can now be found in the care plan section) Acute Rehab PT Goals Patient Stated Goal: to go home PT Goal Formulation: With patient Time For Goal Achievement: 01/22/22 Potential to Achieve Goals: Good Progress towards PT goals: Progressing toward goals    Frequency    Min 3X/week      PT Plan Current plan remains appropriate    Co-evaluation              AM-PAC PT "6 Clicks" Mobility   Outcome Measure  Help needed turning from your back to your side while in a flat bed without using bedrails?: A Little Help needed moving from lying on your back to sitting on the side of a flat bed without using bedrails?: A Lot Help needed moving to and from a bed to a chair (including a wheelchair)?: A Lot Help needed standing up from a chair using your arms (e.g., wheelchair or bedside chair)?: A Lot Help needed to walk in hospital room?: Total Help needed climbing 3-5 steps with a railing? : Total 6 Click Score: 11    End of Session   Activity Tolerance: Patient limited by fatigue Patient left: in bed;with call bell/phone within reach;with bed alarm set Nurse Communication: Mobility status (bloody drainge from incision) PT Visit Diagnosis: Unsteadiness on feet (R26.81);Muscle weakness (generalized) (M62.81)     Time: 1194-1740 PT Time Calculation (min) (ACUTE ONLY): 18 min  Charges:  $Therapeutic Exercise: 8-22 mins                    Minna Merritts, PT, MPT    Percell Locus 01/10/2022, 1:07 PM

## 2022-01-10 NOTE — Assessment & Plan Note (Addendum)
-   Patient lives at home with his cousin. He has very poor health literacy at baseline; with his new colostomy, there is concern for patient being able to handle this at time of discharge.   - patient needs ongoing ostomy education while inpatient as he's anticipated to go home after rehab unless he transitions into long term care

## 2022-01-10 NOTE — Care Management Important Message (Signed)
Important Message  Patient Details  Name: Carl Barr MRN: [id number] Date of Birth: [birth year]   Medicare Important Message Given:  Yes     Orbie Pyo 01/10/2022, 3:54 PM

## 2022-01-10 NOTE — Progress Notes (Signed)
Progress Note  6 Days Post-Op  Subjective: Pt still having high volume liquid output from colostomy. Denies nausea or vomiting this AM. Unable to really tell me if abdomen feels bloated. Denies significant pain this AM but reports feeling tired.   Objective: Vital signs in last 24 hours: Temp:  [98.3 F (36.8 C)-99.4 F (37.4 C)] 98.3 F (36.8 C) (07/06 0757) Pulse Rate:  [99-105] 100 (07/06 0757) Resp:  [16-18] 18 (07/06 0757) BP: (113-137)/(72-89) 117/89 (07/06 0757) SpO2:  [72 %-99 %] 97 % (07/06 0757) Last BM Date : 01/09/22  Intake/Output from previous day: 07/05 0701 - 07/06 0700 In: 4248.8 [I.V.:2652.4; IV Piggyback:1596.4] Out: 3370 [Urine:750; Stool:2620] Intake/Output this shift: No intake/output data recorded.  PE: General: WD, obese male who is laying in bed in NAD Heart: regular, rate, and rhythm.  Lungs: Respiratory effort nonlabored Abd: soft, NT, moderately distended, +BS, incision C/D/I with staples present, ostomy viable with liquid output    Lab Results:  Recent Labs    01/09/22 0050 01/10/22 0105  WBC 14.4* 24.4*  HGB 10.1* 11.0*  HCT 31.5* 33.2*  PLT 348 376    BMET Recent Labs    01/09/22 1512 01/10/22 0105  NA 139 141  K 4.2 2.8*  CL 104 102  CO2 22 21*  GLUCOSE 87 85  BUN 10 9  CREATININE 0.69 0.81  CALCIUM 8.7* 9.2    PT/INR No results for input(s): "LABPROT", "INR" in the last 72 hours. CMP     Component Value Date/Time   NA 141 01/10/2022 0105   NA 139 12/16/2018 1142   K 2.8 (L) 01/10/2022 0105   CL 102 01/10/2022 0105   CO2 21 (L) 01/10/2022 0105   GLUCOSE 85 01/10/2022 0105   BUN 9 01/10/2022 0105   BUN 15 12/16/2018 1142   CREATININE 0.81 01/10/2022 0105   CALCIUM 9.2 01/10/2022 0105   PROT 6.2 (L) 01/09/2022 0050   PROT 8.0 12/16/2018 1142   ALBUMIN 2.0 (L) 01/09/2022 0050   ALBUMIN 4.1 12/16/2018 1142   AST 19 01/09/2022 0050   ALT 16 01/09/2022 0050   ALKPHOS 39 01/09/2022 0050   BILITOT 1.0  01/09/2022 0050   BILITOT 0.4 12/16/2018 1142   GFRNONAA >60 01/10/2022 0105   GFRAA >60 08/01/2019 0654   Lipase     Component Value Date/Time   LIPASE 29 01/03/2022 1714       Studies/Results: CT ABDOMEN PELVIS W CONTRAST  Result Date: 01/09/2022 CLINICAL DATA:  Postop abdominal pain EXAM: CT ABDOMEN AND PELVIS WITH CONTRAST TECHNIQUE: Multidetector CT imaging of the abdomen and pelvis was performed using the standard protocol following bolus administration of intravenous contrast. RADIATION DOSE REDUCTION: This exam was performed according to the departmental dose-optimization program which includes automated exposure control, adjustment of the mA and/or kV according to patient size and/or use of iterative reconstruction technique. CONTRAST:  150m OMNIPAQUE IOHEXOL 300 MG/ML  SOLN COMPARISON:  CT abdomen and pelvis dated January 03, 2022 FINDINGS: Lower chest: Small left pleural effusion and atelectasis. Hepatobiliary: Scattered low-attenuation liver lesions, too small to completely characterize, but likely simple cysts. Gallbladder is unremarkable. No biliary ductal dilation. Pancreas: Unremarkable. No pancreatic ductal dilatation or surrounding inflammatory changes. Spleen: Normal in size without focal abnormality. Adrenals/Urinary Tract: Adrenal glands are unremarkable. Kidneys are normal, without renal calculi, focal lesion, or hydronephrosis. Bladder is unremarkable. Stomach/Bowel: Interval sigmoidectomy with left upper quadrant ostomy. Multiple dilated loops of small bowel are seen. Mild wall thickening of the proximal  transverse colon. Vascular/Lymphatic: No significant vascular findings are present. No enlarged abdominal or pelvic lymph nodes. Reproductive: Mild prostatomegaly. Other: Postsurgical changes of the anterior abdominal wall. Small locules of free intraperitoneal air and trace fluid in the left hemiabdomen, likely postsurgical. Musculoskeletal: Diffuse flowing anterior  osteophytes, findings can be seen in the setting of diffuse idiopathic skeletal hyperostosis. No acute or significant osseous findings. IMPRESSION: 1. Interval sigmoidectomy with left upper quadrant ostomy. Small locules of free intraperitoneal air and trace fluid in the left hemiabdomen, likely postsurgical. 2. Multiple dilated loops of small bowel, likely due to ileus. 3. Mild wall thickening of the proximal transverse colon, likely reactive. 4. Small left pleural effusion. Electronically Signed   By: Yetta Glassman M.D.   On: 01/09/2022 17:10   DG Abd Portable 1V  Result Date: 01/09/2022 CLINICAL DATA:  Ileus EXAM: PORTABLE ABDOMEN - 1 VIEW COMPARISON:  January 07, 2022 FINDINGS: Multiple air-filled dilated small bowel loops are identified in the abdomen and pelvis. There is air identified in the rectal sigmoid colon. Midline surgical staples are identified. Degenerative joint changes of the spine are noted. IMPRESSION: Findings consistent with partial small bowel obstruction or ileus. This is worsened compared prior exam. Electronically Signed   By: Abelardo Diesel M.D.   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 Mg ok this AM  FEN: NPO, IVF'@75'$  cc/h VTE: LMWH ID: [email or web address] 6/30   ?Hx of alcohol abuse  LOS: 7 days    Norm Parcel, [facility] 01/10/2022, 8:57 AM Please see Amion for pager number during day hours 7:00am-4:30pm

## 2022-01-10 NOTE — Consult Note (Signed)
Carl Barr ostomy follow up  Placed a temporary high output pouch with bedside drainage bag today until output decreases. If HOP pouch is needed have Network engineer order Carl Barr # [phone]) 2 3/4" skin barriers are in the room on the shelf.  Stoma type/location: LLQ colostomy Stomal assessment/size: 2 1/2 inches, pink, moist just above skin level Peristomal assessment: sutures intact Treatment options for stomal/peristomal skin: barrier ring Output : yellow mucous Ostomy pouching: 2pc. 2 3/4 inch pouch Carl Barr # [phone]) skin barrier Carl Barr #2) Barrier ring Carl Barr # [phone]) Education provided: None today. Patient sleepy and no family members present. States he lives with a cousin. Educational materials left in room.  Enrolled patient in [place] program: Yes  Emptied 450cc from pouch before changing.   Carl Marseilles [name], MSN, RN, [name], Carl Barr, [name] Wound Treatment Associate Pager [phone]

## 2022-01-10 NOTE — Progress Notes (Signed)
Progress Note    Carl Barr   URK:[id number]  DOB: 1956-02-01  DOA: 01/03/2022     7 PCP: Antony Blackbird, MD  Initial CC: N/V  Hospital Course: Carl Barr is a 66 yo male with PMH lumbar DDD who presented with N/V and inability to pass gas/bowel movements.  CT abdomen/pelvis on admission noted 2 areas of stricture in the colon involving the splenic flexure and sigmoid colon.  Area was concerning for malignancy.  He was evaluated by general surgery.  He underwent left sigmoid and distal transverse colectomy with creation of colostomy on 01/04/2022.  He then developed a postop ileus with ongoing high output.  Interval History:  No events overnight.  Laying in bed in no distress this morning.  He was ready for trialing liquids.  When trying to talk to him about ostomy management, he clearly does not have a good understanding of the work it will take to do it independently at least.  Assessment and Plan: * Large bowel obstruction ([place]) - CT abdomen/pelvis on admission noted 2 areas of stricture in the colon involving the splenic flexure and sigmoid colon. -Underwent sigmoid and distal transverse colectomy with creation of colostomy on 01/04/2022 -continues to have high output liquid stool from ostomy with ongoing concern for ileus - Follow-up surgical pathology as well - General surgery managing, appreciate assistance - trial of CLD today per surgery; if doesn't tolerate would be in need of TPN   Hypokalemia - Replete as needed  Poor social situation - Patient lives at home with his cousin. He has very poor health literacy at baseline; with his new colostomy, there is concern for patient being able to handle this at time of discharge.  We will need to clarify whether or not cousin at home could accommodate managing this.  Tentatively, patient may discharge to SNF but long-term disposition will need to be considered in general  Hypophosphatemia - Replete as needed  Hypomagnesemia -  Replete as needed   Alcohol use - No further concern for any withdrawal given prolonged hospitalization   Old records reviewed in assessment of this patient  Antimicrobials:   DVT prophylaxis:  enoxaparin (LOVENOX) injection 40 mg Start: 01/07/22 1130 SCDs Start: 01/04/22 0616   Code Status:   Code Status: Full Code  Mobility Assessment (last 72 hours)     Mobility Assessment     Row Name 01/10/22 1557 01/10/22 1300 01/09/22 2000 01/09/22 1100 01/09/22 0726   Does patient have an order for bedrest or is patient medically unstable No - Continue assessment -- No - Continue assessment No - Continue assessment No - Continue assessment   What is the highest level of mobility based on the progressive mobility assessment? Level 4 (Walks with assist in room) - Balance while marching in place and cannot step forward and back - Complete Level 4 (Walks with assist in room) - Balance while marching in place and cannot step forward and back - Complete Level 4 (Walks with assist in room) - Balance while marching in place and cannot step forward and back - Complete Level 4 (Walks with assist in room) - Balance while marching in place and cannot step forward and back - Complete Level 3 (Stands with assist) - Balance while standing  and cannot march in place   Is the above level different from baseline mobility prior to current illness? Yes - Recommend PT order -- Yes - Recommend PT order -- --    Doyle Name 01/08/22 2150 01/08/22  1200 01/08/22 0854 01/08/22 0853     Does patient have an order for bedrest or is patient medically unstable No - Continue assessment -- No - Continue assessment No - Continue assessment    What is the highest level of mobility based on the progressive mobility assessment? Level 3 (Stands with assist) - Balance while standing  and cannot march in place Level 3 (Stands with assist) - Balance while standing  and cannot march in place Level 2 (Chairfast) - Balance while sitting on  edge of bed and cannot stand Level 2 (Chairfast) - Balance while sitting on edge of bed and cannot stand    Is the above level different from baseline mobility prior to current illness? Yes - Recommend PT order -- Yes - Recommend PT order --             Disposition Plan:  SNF Status is: Inpt  Objective: Blood pressure (!) 131/92, pulse 92, temperature 98.3 F (36.8 C), temperature source Oral, resp. rate 18, height '5\' 9"'$  (1.753 m), weight 81.6 kg, SpO2 98 %.  Examination:  Physical Exam Constitutional:      General: He is not in acute distress.    Appearance: Normal appearance.  HENT:     Head: Normocephalic and atraumatic.     Mouth/Throat:     Mouth: Mucous membranes are moist.  Eyes:     Extraocular Movements: Extraocular movements intact.  Cardiovascular:     Rate and Rhythm: Normal rate and regular rhythm.     Heart sounds: Normal heart sounds.  Pulmonary:     Effort: Pulmonary effort is normal. No respiratory distress.     Breath sounds: Normal breath sounds. No wheezing.  Abdominal:     Comments: Soft, nontender, nondistended.  Bowel sounds hypoactive.  Ostomy bag in place with liquid stool brown  Musculoskeletal:        General: Normal range of motion.     Cervical back: Normal range of motion and neck supple.  Skin:    General: Skin is warm and dry.  Neurological:     General: No focal deficit present.     Mental Status: He is alert.  Psychiatric:        Mood and Affect: Mood normal.        Behavior: Behavior normal.      Consultants:  General surgery  Procedures:  Left sigmoid and distal transverse colectomy with colostomy creation, 01/04/2022  Data Reviewed: Results for orders placed or performed during the hospital encounter of 01/03/22 (from the past 24 hour(s))  Basic metabolic panel     Status: Abnormal   Collection Time: 01/10/22  1:05 AM  Result Value Ref Range   Sodium 141 135 - 145 mmol/L   Potassium 2.8 (L) 3.5 - 5.1 mmol/L   Chloride  102 98 - 111 mmol/L   CO2 21 (L) 22 - 32 mmol/L   Glucose, Bld 85 70 - 99 mg/dL   BUN 9 8 - 23 mg/dL   Creatinine, Ser 0.81 0.61 - 1.24 mg/dL   Calcium 9.2 8.9 - 10.3 mg/dL   GFR, Estimated >60 >60 mL/min   Anion gap 18 (H) 5 - 15  CBC with Differential/Platelet     Status: Abnormal   Collection Time: 01/10/22  1:05 AM  Result Value Ref Range   WBC 24.4 (H) 4.0 - 10.5 K/uL   RBC 4.37 4.22 - 5.81 MIL/uL   Hemoglobin 11.0 (L) 13.0 - 17.0 g/dL   HCT 33.2 (  L) 39.0 - 52.0 %   MCV 76.0 (L) 80.0 - 100.0 fL   MCH 25.2 (L) 26.0 - 34.0 pg   MCHC 33.1 30.0 - 36.0 g/dL   RDW 17.1 (H) 11.5 - 15.5 %   Platelets 376 150 - 400 K/uL   nRBC 0.2 0.0 - 0.2 %   Neutrophils Relative % 81 %   Neutro Abs 19.8 (H) 1.7 - 7.7 K/uL   Lymphocytes Relative 8 %   Lymphs Abs 2.0 0.7 - 4.0 K/uL   Monocytes Relative 8 %   Monocytes Absolute 2.0 (H) 0.1 - 1.0 K/uL   Eosinophils Relative 3 %   Eosinophils Absolute 0.7 (H) 0.0 - 0.5 K/uL   Basophils Relative 0 %   Basophils Absolute 0.0 0.0 - 0.1 K/uL   nRBC 0 0 /100 WBC   Abs Immature Granulocytes 0.00 0.00 - 0.07 K/uL  Magnesium     Status: None   Collection Time: 01/10/22  1:05 AM  Result Value Ref Range   Magnesium 2.0 1.7 - 2.4 mg/dL    I have Reviewed nursing notes, Vitals, and Lab results since pt's last encounter. Pertinent lab results : see above I have ordered test including BMP, CBC, Mg I have reviewed the last note from staff over past 24 hours I have discussed pt's care plan and test results with nursing staff, case manager   LOS: 7 days   Dwyane Dee, MD [facility] 01/10/2022, 5:34 PM

## 2022-01-11 ENCOUNTER — Inpatient Hospital Stay: Payer: Self-pay

## 2022-01-11 DIAGNOSIS — Z659 Problem related to unspecified psychosocial circumstances: Secondary | ICD-10-CM | POA: Diagnosis not present

## 2022-01-11 DIAGNOSIS — K56609 Unspecified intestinal obstruction, unspecified as to partial versus complete obstruction: Secondary | ICD-10-CM | POA: Diagnosis not present

## 2022-01-11 DIAGNOSIS — E440 Moderate protein-calorie malnutrition: Secondary | ICD-10-CM | POA: Insufficient documentation

## 2022-01-11 DIAGNOSIS — E876 Hypokalemia: Secondary | ICD-10-CM | POA: Diagnosis not present

## 2022-01-11 DIAGNOSIS — S31109A Unspecified open wound of abdominal wall, unspecified quadrant without penetration into peritoneal cavity, initial encounter: Secondary | ICD-10-CM | POA: Diagnosis not present

## 2022-01-11 LAB — CBC WITH DIFFERENTIAL/PLATELET
Abs Immature Granulocytes: 0.6 10*3/uL — ABNORMAL HIGH (ref 0.00–0.07)
Basophils Absolute: 0.2 10*3/uL — ABNORMAL HIGH (ref 0.0–0.1)
Basophils Relative: 1 %
Eosinophils Absolute: 0.2 10*3/uL (ref 0.0–0.5)
Eosinophils Relative: 1 %
HCT: 33.9 % — ABNORMAL LOW (ref 39.0–52.0)
Hemoglobin: 11.2 g/dL — ABNORMAL LOW (ref 13.0–17.0)
Lymphocytes Relative: 6 %
Lymphs Abs: 1.1 10*3/uL (ref 0.7–4.0)
MCH: 24.7 pg — ABNORMAL LOW (ref 26.0–34.0)
MCHC: 33 g/dL (ref 30.0–36.0)
MCV: 74.7 fL — ABNORMAL LOW (ref 80.0–100.0)
Metamyelocytes Relative: 2 %
Monocytes Absolute: 2.4 10*3/uL — ABNORMAL HIGH (ref 0.1–1.0)
Monocytes Relative: 13 %
Myelocytes: 1 %
Neutro Abs: 14.1 10*3/uL — ABNORMAL HIGH (ref 1.7–7.7)
Neutrophils Relative %: 76 %
Platelets: 401 10*3/uL — ABNORMAL HIGH (ref 150–400)
RBC: 4.54 MIL/uL (ref 4.22–5.81)
RDW: 17.5 % — ABNORMAL HIGH (ref 11.5–15.5)
WBC: 18.5 10*3/uL — ABNORMAL HIGH (ref 4.0–10.5)
nRBC: 0.3 % — ABNORMAL HIGH (ref 0.0–0.2)
nRBC: 1 /100 WBC — ABNORMAL HIGH

## 2022-01-11 LAB — PHOSPHORUS
Phosphorus: 3 mg/dL (ref 2.5–4.6)
Phosphorus: 2.7 mg/dL (ref 2.5–4.6)

## 2022-01-11 LAB — BASIC METABOLIC PANEL
Anion gap: 16 — ABNORMAL HIGH (ref 5–15)
Anion gap: 14 (ref 5–15)
BUN: 12 mg/dL (ref 8–23)
BUN: 28 mg/dL — ABNORMAL HIGH (ref 8–23)
CO2: 21 mmol/L — ABNORMAL LOW (ref 22–32)
CO2: 24 mmol/L (ref 22–32)
Calcium: 8.8 mg/dL — ABNORMAL LOW (ref 8.9–10.3)
Calcium: 9.1 mg/dL (ref 8.9–10.3)
Chloride: 105 mmol/L (ref 98–111)
Chloride: 103 mmol/L (ref 98–111)
Creatinine, Ser: 0.64 mg/dL (ref 0.61–1.24)
Creatinine, Ser: 1.19 mg/dL (ref 0.61–1.24)
GFR, Estimated: >60 mL/min (ref >60)
GFR, Estimated: >60 mL/min (ref >60)
Glucose, Bld: 119 mg/dL — ABNORMAL HIGH (ref 70–99)
Glucose, Bld: 125 mg/dL — ABNORMAL HIGH (ref 70–99)
Potassium: 2.9 mmol/L — ABNORMAL LOW (ref 3.5–5.1)
Potassium: 3.1 mmol/L — ABNORMAL LOW (ref 3.5–5.1)
Sodium: 142 mmol/L (ref 135–145)
Sodium: 141 mmol/L (ref 135–145)

## 2022-01-11 LAB — GLUCOSE, CAPILLARY: Glucose-Capillary: 134 mg/dL — ABNORMAL HIGH (ref 70–99)

## 2022-01-11 LAB — MAGNESIUM
Magnesium: 2.1 mg/dL (ref 1.7–2.4)
Magnesium: 2.1 mg/dL (ref 1.7–2.4)

## 2022-01-11 MED ORDER — INSULIN ASPART 100 UNIT/ML IJ SOLN
0.0000 [IU] | Freq: Four times a day (QID) | INTRAMUSCULAR | Status: DC
  Administered 2022-01-12: 2 [IU] via SUBCUTANEOUS
  Administered 2022-01-12 (×2): 1 [IU] via SUBCUTANEOUS
  Administered 2022-01-12 – 2022-01-13 (×3): 2 [IU] via SUBCUTANEOUS
  Administered 2022-01-13: 1 [IU] via SUBCUTANEOUS
  Administered 2022-01-13: 2 [IU] via SUBCUTANEOUS
  Administered 2022-01-14 (×2): 1 [IU] via SUBCUTANEOUS

## 2022-01-11 MED ORDER — POTASSIUM CHLORIDE 10 MEQ/50ML IV SOLN
10.0000 meq | INTRAVENOUS | Status: AC
  Administered 2022-01-11 – 2022-01-12 (×6): 10 meq via INTRAVENOUS
  Filled 2022-01-11 (×6): qty 50

## 2022-01-11 MED ORDER — SODIUM CHLORIDE 0.9% FLUSH
10.0000 mL | Freq: Two times a day (BID) | INTRAVENOUS | Status: DC
  Administered 2022-01-12 – 2022-01-22 (×19): 10 mL

## 2022-01-11 MED ORDER — TRAVASOL 10 % IV SOLN
INTRAVENOUS | Status: AC
  Filled 2022-01-11: qty 518.4

## 2022-01-11 MED ORDER — POTASSIUM CHLORIDE 10 MEQ/100ML IV SOLN
10.0000 meq | INTRAVENOUS | Status: AC
  Administered 2022-01-11 (×4): 10 meq via INTRAVENOUS
  Filled 2022-01-11 (×5): qty 100

## 2022-01-11 MED ORDER — LACTATED RINGERS IV SOLN: INTRAVENOUS | Status: DC

## 2022-01-11 MED ORDER — POTASSIUM CHLORIDE 10 MEQ/100ML IV SOLN
10.0000 meq | INTRAVENOUS | Status: AC
  Administered 2022-01-11 (×2): 10 meq via INTRAVENOUS
  Filled 2022-01-11 (×2): qty 100

## 2022-01-11 MED ORDER — SODIUM CHLORIDE 0.9% FLUSH
10.0000 mL | INTRAVENOUS | Status: DC | PRN
  Administered 2022-01-21 – 2022-01-22 (×2): 10 mL

## 2022-01-11 NOTE — Progress Notes (Signed)
Initial Nutrition Assessment  DOCUMENTATION CODES:   Non-severe (moderate) malnutrition in context of social or environmental circumstances  INTERVENTION:   TPN per pharmacy   Recommend thiamine 100mg  daily added to TPN x 3 days   Pt at high refeed risk; recommend monitor potassium, magnesium and phosphorus labs daily until stable  Daily weights   NUTRITION DIAGNOSIS:   Moderate Malnutrition related to social / environmental circumstances (etoh abuse) as evidenced by mild fat depletion, moderate muscle depletion.  GOAL:   Patient will meet greater than or equal to 90% of their needs  MONITOR:   Labs, Diet advancement, Weight trends, Skin, I & O's, Other (Comment) (TPN)  REASON FOR ASSESSMENT:   Consult New TPN/TNA  ASSESSMENT:   66 y/o male with h/o etoh abuse who is admitted with LBO secondary to colon mass now s/p L sigmoid and distal transverse colecotmy with colosotmy 4/70 complicated by post op ileus.  Met with pt in room today. Pt reports decreased appetite and oral intake at baseline; pt reports "I am not a big eater". RD suspects pt with poor oral intake secondary to etoh abuse. Pt reports that he does not drink any supplements at home but he willing to drink them in hospital when appropriate. Pt initiated on clear liquids 7/2. Pt developed nausea and vomiting on 7/3 and was made NPO. Pt reports that he is feeling ok today. Pt denies any abdominal pain or nausea today. Pt reports frequent burping. Pt does have moderate abdominal distension. Pt with high volume output from his ostomy. KUB from 7/5 consistent with partial small bowel obstruction or ileus. RN unable to place NGT and pt refused further attempts. Pt has remained on NPO/clear liquid diet since admission and is now without adequate nutrition for 8 days. Plan today is for PICC line and TPN. Pt is at high refeed risk. There is no documented weight history in chart to determine if any significant recent weight  changes. Pt is unsure of his UBW. Pt has only had one weight since admission so unsure of any weight changes in hospital. Will order daily weights. Pt is at high risk for developing severe malnutrition.   Medications reviewed and include: lovenox, insulin, LRS @50ml /hr, Kcl  Labs reviewed: K 2.9 (L), P 3.0 wnl, Mg 2.1 wnl Wbc- 18.5(H)  NUTRITION - FOCUSED PHYSICAL EXAM:  Flowsheet Row Most Recent Value  Orbital Region Mild depletion  Upper Arm Region No depletion  Thoracic and Lumbar Region Mild depletion  Buccal Region Mild depletion  Temple Region Moderate depletion  Clavicle and Acromion Bone Region Moderate depletion  Scapular Bone Region Mild depletion  Dorsal Hand Moderate depletion  Patellar Region Moderate depletion  Anterior Thigh Region Mild depletion  Posterior Calf Region Moderate depletion  Edema (RD Assessment) None  Hair Reviewed  Eyes Reviewed  Mouth Reviewed  Skin Reviewed  Nails Reviewed   Diet Order:   Diet Order             Diet NPO time specified  Diet effective now                  EDUCATION NEEDS:   Education needs have been addressed  Skin:  Skin Assessment: Reviewed RN Assessment (incision abdomen)  Last BM:  7/7- 437ml via ostomy  Height:   Ht Readings from Last 1 Encounters:  01/04/22 5\' 9"  (1.753 m)    Weight:   Wt Readings from Last 1 Encounters:  01/11/22 84.6 kg    Ideal  Body Weight:  72.7 kg  BMI:  Body mass index is 27.54 kg/m.  Estimated Nutritional Needs:   Kcal:  2000-2300kcal/day  Protein:  100-115g/day  Fluid:  1.9-2.2L/day  Koleen Distance MS, RD, LDN Please refer to [facility] for RD and/or RD on-call/weekend/after hours pager

## 2022-01-11 NOTE — Progress Notes (Signed)
Progress Note    Carl Barr   ZOX:[id number]  DOB: 30-Mar-1956  DOA: 01/03/2022     8 PCP: Antony Blackbird, MD  Initial CC: N/V  Hospital Course: Carl Barr is a 66 yo male with PMH lumbar DDD who presented with N/V and inability to pass gas/bowel movements.  CT abdomen/pelvis on admission noted 2 areas of stricture in the colon involving the splenic flexure and sigmoid colon.  Area was concerning for malignancy.  He was evaluated by general surgery.  He underwent left sigmoid and distal transverse colectomy with creation of colostomy on 01/04/2022.  He then developed a postop ileus with ongoing high output.  Interval History:  No events overnight. Still declining NGT unfortunately. He also vomited again and did not tolerated CLD well. TPN and PICC ordered today.   Assessment and Plan: * Large bowel obstruction (HCC) - CT abdomen/pelvis on admission noted 2 areas of stricture in the colon involving the splenic flexure and sigmoid colon. -Underwent sigmoid and distal transverse colectomy with creation of colostomy on 01/04/2022 -continues to have high output liquid stool from ostomy with ongoing concern for ileus - Follow-up surgical pathology as well - General surgery managing, appreciate assistance -Did not tolerate CLD well with recurrent vomiting.  Patient still refusing NG tube placement despite surgery recommendations - Patient now ordered for PICC line placement and initiation of TPN  Open abdominal wall wound - patient had further bleeding from lower abdominal incision this morning - underwent staple removal of lower incision and started on BID dressing changes with saline moistened gauze per surgery   Hypokalemia - Replete as needed  Poor social situation - Patient lives at home with his cousin. He has very poor health literacy at baseline; with his new colostomy, there is concern for patient being able to handle this at time of discharge.  We will need to clarify whether  or not cousin at home could accommodate managing this.  Tentatively, patient may discharge to SNF but long-term disposition will need to be considered in general  Malnutrition of moderate degree - Patient's BMI is Body mass index is 27.54 kg/m.. - Patient has the following signs/symptoms consistent with PCM: (Mild fat depletion, moderate muscle depletion) - seen by RD, appreciate assistance. Continue plan per RD to include thiamine added to TPN - Trend electrolytes   Hypophosphatemia - Replete as needed  Hypomagnesemia - Replete as needed   Alcohol use - No further concern for any withdrawal given prolonged hospitalization   Old records reviewed in assessment of this patient  Antimicrobials:   DVT prophylaxis:  enoxaparin (LOVENOX) injection 40 mg Start: 01/07/22 1130 SCDs Start: 01/04/22 0616   Code Status:   Code Status: Full Code  Mobility Assessment (last 72 hours)     Mobility Assessment     Row Name 01/10/22 2025 01/10/22 1557 01/10/22 1300 01/09/22 2000 01/09/22 1100   Does patient have an order for bedrest or is patient medically unstable No - Continue assessment No - Continue assessment -- No - Continue assessment No - Continue assessment   What is the highest level of mobility based on the progressive mobility assessment? Level 2 (Chairfast) - Balance while sitting on edge of bed and cannot stand Level 4 (Walks with assist in room) - Balance while marching in place and cannot step forward and back - Complete Level 4 (Walks with assist in room) - Balance while marching in place and cannot step forward and back - Complete Level 4 (Walks with assist  in room) - Balance while marching in place and cannot step forward and back - Complete Level 4 (Walks with assist in room) - Balance while marching in place and cannot step forward and back - Complete   Is the above level different from baseline mobility prior to current illness? Yes - Recommend PT order Yes - Recommend PT  order -- Yes - Recommend PT order --    [place] Name 01/09/22 0726 01/08/22 2150         Does patient have an order for bedrest or is patient medically unstable No - Continue assessment No - Continue assessment      What is the highest level of mobility based on the progressive mobility assessment? Level 3 (Stands with assist) - Balance while standing  and cannot march in place Level 3 (Stands with assist) - Balance while standing  and cannot march in place      Is the above level different from baseline mobility prior to current illness? -- Yes - Recommend PT order               Disposition Plan:  SNF Status is: Inpt  Objective: Blood pressure 130/77, pulse (!) 113, temperature 98.2 F (36.8 C), temperature source Oral, resp. rate 16, height '5\' 9"'$  (1.753 m), weight 84.6 kg, SpO2 97 %.  Examination:  Physical Exam Constitutional:      General: He is not in acute distress.    Appearance: Normal appearance.  HENT:     Head: Normocephalic and atraumatic.     Mouth/Throat:     Mouth: Mucous membranes are moist.  Eyes:     Extraocular Movements: Extraocular movements intact.  Cardiovascular:     Rate and Rhythm: Normal rate and regular rhythm.     Heart sounds: Normal heart sounds.  Pulmonary:     Effort: Pulmonary effort is normal. No respiratory distress.     Breath sounds: Normal breath sounds. No wheezing.  Abdominal:     Comments: Soft, nontender, nondistended.  Bowel sounds hypoactive.  Ostomy bag in place with liquid stool brown  Musculoskeletal:        General: Normal range of motion.     Cervical back: Normal range of motion and neck supple.  Skin:    General: Skin is warm and dry.  Neurological:     General: No focal deficit present.     Mental Status: He is alert.  Psychiatric:        Mood and Affect: Mood normal.        Behavior: Behavior normal.      Consultants:  General surgery  Procedures:  Left sigmoid and distal transverse colectomy with colostomy  creation, 01/04/2022  Data Reviewed: Results for orders placed or performed during the hospital encounter of 01/03/22 (from the past 24 hour(s))  Basic metabolic panel     Status: Abnormal   Collection Time: 01/11/22 12:40 AM  Result Value Ref Range   Sodium 142 135 - 145 mmol/L   Potassium 2.9 (L) 3.5 - 5.1 mmol/L   Chloride 105 98 - 111 mmol/L   CO2 21 (L) 22 - 32 mmol/L   Glucose, Bld 119 (H) 70 - 99 mg/dL   BUN 12 8 - 23 mg/dL   Creatinine, Ser 0.64 0.61 - 1.24 mg/dL   Calcium 8.8 (L) 8.9 - 10.3 mg/dL   GFR, Estimated >60 >60 mL/min   Anion gap 16 (H) 5 - 15  CBC with Differential/Platelet     Status:  Abnormal   Collection Time: 01/11/22 12:40 AM  Result Value Ref Range   WBC 18.5 (H) 4.0 - 10.5 K/uL   RBC 4.54 4.22 - 5.81 MIL/uL   Hemoglobin 11.2 (L) 13.0 - 17.0 g/dL   HCT 33.9 (L) 39.0 - 52.0 %   MCV 74.7 (L) 80.0 - 100.0 fL   MCH 24.7 (L) 26.0 - 34.0 pg   MCHC 33.0 30.0 - 36.0 g/dL   RDW 17.5 (H) 11.5 - 15.5 %   Platelets 401 (H) 150 - 400 K/uL   nRBC 0.3 (H) 0.0 - 0.2 %   Neutrophils Relative % 76 %   Neutro Abs 14.1 (H) 1.7 - 7.7 K/uL   Lymphocytes Relative 6 %   Lymphs Abs 1.1 0.7 - 4.0 K/uL   Monocytes Relative 13 %   Monocytes Absolute 2.4 (H) 0.1 - 1.0 K/uL   Eosinophils Relative 1 %   Eosinophils Absolute 0.2 0.0 - 0.5 K/uL   Basophils Relative 1 %   Basophils Absolute 0.2 (H) 0.0 - 0.1 K/uL   nRBC 1 (H) 0 /100 WBC   Metamyelocytes Relative 2 %   Myelocytes 1 %   Abs Immature Granulocytes 0.60 (H) 0.00 - 0.07 K/uL   Polychromasia PRESENT   Magnesium     Status: None   Collection Time: 01/11/22 12:40 AM  Result Value Ref Range   Magnesium 2.1 1.7 - 2.4 mg/dL  Phosphorus     Status: None   Collection Time: 01/11/22 12:40 AM  Result Value Ref Range   Phosphorus 3.0 2.5 - 4.6 mg/dL    I have Reviewed nursing notes, Vitals, and Lab results since pt's last encounter. Pertinent lab results : see above I have ordered test including BMP, CBC, Mg I have  reviewed the last note from staff over past 24 hours I have discussed pt's care plan and test results with nursing staff, case manager   LOS: 8 days   Dwyane Dee, MD [facility] 01/11/2022, 4:07 PM

## 2022-01-11 NOTE — Progress Notes (Addendum)
Progress Note  7 Days Post-Op  Subjective: Pt still having high volume liquid output from colostomy. Trial of sips of clears yesterday, vomited this AM. Refusing NGT.   Objective: Vital signs in last 24 hours: Temp:  [97.5 F (36.4 C)-98.4 F (36.9 C)] 98.2 F (36.8 C) (07/07 0802) Pulse Rate:  [92-113] 113 (07/07 0802) Resp:  [16-18] 16 (07/07 0802) BP: (130-134)/(77-92) 130/77 (07/07 0802) SpO2:  [97 %-98 %] 97 % (07/07 0802) Last BM Date : 01/10/22  Intake/Output from previous day: 07/06 0701 - 07/07 0700 In: 677 [P.O.:237; IV Piggyback:440] Out: 4628 [Urine:1050; Stool:4325] Intake/Output this shift: No intake/output data recorded.  PE: General: WD, obese male who is laying in bed in NAD Heart: regular, rate, and rhythm.  Lungs: Respiratory effort nonlabored Abd: soft, NT, moderately distended, +BS, incision C/D/I with staples present, ostomy viable with liquid output, feculent appearing emesis in basin     Lab Results:  Recent Labs    01/10/22 0105 01/11/22 0040  WBC 24.4* 18.5*  HGB 11.0* 11.2*  HCT 33.2* 33.9*  PLT 376 401*    BMET Recent Labs    01/10/22 0105 01/11/22 0040  NA 141 142  K 2.8* 2.9*  CL 102 105  CO2 21* 21*  GLUCOSE 85 119*  BUN 9 12  CREATININE 0.81 0.64  CALCIUM 9.2 8.8*    PT/INR No results for input(s): "LABPROT", "INR" in the last 72 hours. CMP     Component Value Date/Time   NA 142 01/11/2022 0040   NA 139 12/16/2018 1142   K 2.9 (L) 01/11/2022 0040   CL 105 01/11/2022 0040   CO2 21 (L) 01/11/2022 0040   GLUCOSE 119 (H) 01/11/2022 0040   BUN 12 01/11/2022 0040   BUN 15 12/16/2018 1142   CREATININE 0.64 01/11/2022 0040   CALCIUM 8.8 (L) 01/11/2022 0040   PROT 6.2 (L) 01/09/2022 0050   PROT 8.0 12/16/2018 1142   ALBUMIN 2.0 (L) 01/09/2022 0050   ALBUMIN 4.1 12/16/2018 1142   AST 19 01/09/2022 0050   ALT 16 01/09/2022 0050   ALKPHOS 39 01/09/2022 0050   BILITOT 1.0 01/09/2022 0050   BILITOT 0.4 12/16/2018  1142   GFRNONAA >60 01/11/2022 0040   GFRAA >60 08/01/2019 0654   Lipase     Component Value Date/Time   LIPASE 29 01/03/2022 1714       Studies/Results: [place] EKG SITE RITE  Result Date: 01/11/2022 If Site Rite image not attached, placement could not be confirmed due to current cardiac rhythm.  CT ABDOMEN PELVIS W CONTRAST  Result Date: 01/09/2022 CLINICAL DATA:  Postop abdominal pain EXAM: CT ABDOMEN AND PELVIS WITH CONTRAST TECHNIQUE: Multidetector CT imaging of the abdomen and pelvis was performed using the standard protocol following bolus administration of intravenous contrast. RADIATION DOSE REDUCTION: This exam was performed according to the departmental dose-optimization program which includes automated exposure control, adjustment of the mA and/or kV according to patient size and/or use of iterative reconstruction technique. CONTRAST:  1104m OMNIPAQUE IOHEXOL 300 MG/ML  SOLN COMPARISON:  CT abdomen and pelvis dated January 03, 2022 FINDINGS: Lower chest: Small left pleural effusion and atelectasis. Hepatobiliary: Scattered low-attenuation liver lesions, too small to completely characterize, but likely simple cysts. Gallbladder is unremarkable. No biliary ductal dilation. Pancreas: Unremarkable. No pancreatic ductal dilatation or surrounding inflammatory changes. Spleen: Normal in size without focal abnormality. Adrenals/Urinary Tract: Adrenal glands are unremarkable. Kidneys are normal, without renal calculi, focal lesion, or hydronephrosis. Bladder is unremarkable. Stomach/Bowel: Interval  sigmoidectomy with left upper quadrant ostomy. Multiple dilated loops of small bowel are seen. Mild wall thickening of the proximal transverse colon. Vascular/Lymphatic: No significant vascular findings are present. No enlarged abdominal or pelvic lymph nodes. Reproductive: Mild prostatomegaly. Other: Postsurgical changes of the anterior abdominal wall. Small locules of free intraperitoneal air and trace  fluid in the left hemiabdomen, likely postsurgical. Musculoskeletal: Diffuse flowing anterior osteophytes, findings can be seen in the setting of diffuse idiopathic skeletal hyperostosis. No acute or significant osseous findings. IMPRESSION: 1. Interval sigmoidectomy with left upper quadrant ostomy. Small locules of free intraperitoneal air and trace fluid in the left hemiabdomen, likely postsurgical. 2. Multiple dilated loops of small bowel, likely due to ileus. 3. Mild wall thickening of the proximal transverse colon, likely reactive. 4. Small left pleural effusion. Electronically Signed   By: Yetta Glassman M.D.   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 CEA was mildly elevated at 5.5 - CT 7/5: with ileus but no abscess or leak noted, mild wall thickening of transverse colon  - putting out large volume liquid stool from ostomy  ~4325 cc in last 24 hrs but having nausea and vomiting  - needs to mobilize more  - recommend NGT placement and PICC and TPN, pt currently refusing NGT Hypokalemia - K 2.9 this AM, replacement ordered by TRH. Mg ok this AM  FEN: NPO, IVF'@75'$  cc/h VTE: LMWH ID: [id number] 6/30   ?Hx of alcohol abuse  LOS: 8 days    Norm Parcel, [facility] 01/11/2022, 10:08 AM Please see Amion for pager number during day hours 7:00am-4:30pm  Addendum: Notified by RN of bloody appearing drainage on abdominal dressing after I had seen patient. Dressing taken  down and wound probed inferiorly with sterile swab - return of purulent material. Inferior portion of wound opened up and purulent drainage evacuated. Packed with saline moistened gauze. Will monitor wound, start BID dressing changes.   Norm Parcel, [facility] Surgery 01/11/2022, 11:49 AM Please see Amion for pager number during day hours 7:00am-4:30pm

## 2022-01-11 NOTE — Progress Notes (Signed)
Peripherally Inserted Central Catheter Placement  The IV Nurse has discussed with the patient and/or persons authorized to consent for the patient, the purpose of this procedure and the potential benefits and risks involved with this procedure.  The benefits include less needle sticks, lab draws from the catheter, and the patient may be discharged home with the catheter. Risks include, but not limited to, infection, bleeding, blood clot (thrombus formation), and puncture of an artery; nerve damage and irregular heartbeat and possibility to perform a PICC exchange if needed/ordered by physician.  Alternatives to this procedure were also discussed.  Bard Power PICC patient education guide, fact sheet on infection prevention and patient information card has been provided to patient /or left at bedside.    PICC Placement Documentation  PICC Double Lumen 01/11/22 Right Brachial 41 cm 0 cm (Active)  Indication for Insertion or Continuance of Line Administration of hyperosmolar/irritating solutions (i.e. TPN, Vancomycin, etc.) 01/11/22 2124  Exposed Catheter (cm) 0 cm 01/11/22 2124  Site Assessment Clean, Dry, Intact 01/11/22 2124  Lumen #1 Status Flushed;Blood return noted;Saline locked 01/11/22 2124  Lumen #2 Status Flushed;Blood return noted;Saline locked 01/11/22 2124  Dressing Type Transparent;Securing device 01/11/22 2124  Dressing Status Antimicrobial disc in place;Clean, Dry, Intact 01/11/22 2124  Safety Lock Not Applicable 74/12/87 8676  Line Care Connections checked and tightened 01/11/22 2124  Line Adjustment (NICU/IV Team Only) No 01/11/22 2124  Dressing Intervention New dressing 01/11/22 2124  Dressing Change Due 01/18/22 01/11/22 2124       Rishav Rockefeller, Cathlyn Parsons 01/11/2022, 9:26 PM

## 2022-01-11 NOTE — Assessment & Plan Note (Signed)
-   Patient's BMI is Body mass index is 27.54 kg/m.. - Patient has the following signs/symptoms consistent with PCM: (Mild fat depletion, moderate muscle depletion) - seen by RD, appreciate assistance. Continue plan per RD to include thiamine added to TPN - Trend electrolytes

## 2022-01-11 NOTE — Progress Notes (Signed)
Explained to the pt why he needed an NGT and he flat out refused with a very rude tone.  Dr notified and made aware

## 2022-01-11 NOTE — Progress Notes (Signed)
PHARMACY - TOTAL PARENTERAL NUTRITION CONSULT NOTE   Indication: Post-op ileus  Patient Measurements: Height: '5\' 9"'$  (175.3 cm) Weight: 81.6 kg (180 lb) IBW/kg (Calculated) : 70.7 TPN AdjBW (KG): 81.6 Body mass index is 26.58 kg/m. Usual Weight: patient does not know. Last charted weight was 231 lbs in Jan 2021   Assessment:  66 yo M with colonic obstruction secondary to sigmoid mass s/p open L hemicolectomy with end colostomy 6/30. Patient failed CLD 7/6, had emesis with persistent nausea. NGT is recommended but patient is refusing. Imaging shows post-operative ileus. Patient with no/poor PO intake > 7 days. Patient is requiring very high amounts of K repletion with persistently low K, likely due to high losses in ostomy. Patient is at risk of refeeding. Pharmacy consulted for TPN.   Glucose / Insulin: BG < 180, no hx DM Electrolytes: K 2.9 > IV 60 mEq ordered (received 50-100 mEq IV /d x5 days, Goal >/= 4), CoCa 10.4, phos 3 (received Kphos 30 mmol 7/3), others wnl  Renal: Scr 0.64, BUN wnl Hepatic: LFT/Tbili wnl, albumin 2 Intake / Output; MIVF: LR '@75ml'$ /hr;  UOP 0.5 ml/kg/hr, ostomy output 4325 ml, feculent appearing emesis x1, net neg 339m this admission GI Imaging: 6/29 CT abd- 2 strictures in colon with bowel obstruction, possible colitis or malignancy 7/3 AbdXR- post-op ileus  7/5 AbdXR- partial SBO or ileus, worse than prior 7/5 CT abd- post-op, likely ileus  GI Surgeries / Procedures:  6/30 open L hemicolectomy with end colostomy   Central access: 7/7 PICC ordered  TPN start date: 7/7  Nutritional Goals: Goal TPN rate is 100 mL/hr (provides 130 g of protein, 362g dextrose, 80g lipids, and 2510 kcals per day)  RD Assessment: pending    Current Nutrition:  NPO and TPN  Plan:  Start TPN at 40 mL/hr at 1800 (provides 52 g protein and 1005 Kcal, meeting ~40% of estimated needs) Electrolytes in TPN: Na 65 mEq/L (limited by TPN volume), K 80 mEq/L (max in TPN), Ca 5  mEq/L, Mg 10 mEq/L, and Phos 20 mmol/L. Cl:Ac 1:2 Add standard MVI and trace elements to TPN Add folate/thiamine to TPN to reduce refeeding risk Give another 30 mEq of IV K (total of 90 mEq today before TPN is hung) due to minimal response to 60 mEq of IV K repletion given 7/6  Initiate Sensitive q6h SSI and adjust as needed  Reduce LR to 50 mL/hr at 1800 Additional fluids per team  F/u repeat BMP, Mag, Phos at 21:00 to check for refeeding, replete electrolytes aggressively Monitor TPN labs daily until stable at goal then on Mon/Thurs   LBenetta Spar PharmD, BCPS, BB[place]armac[place]  Please check AMION for all MEssexphone numbers After 10:00 PM, call [place]

## 2022-01-11 NOTE — Plan of Care (Signed)
  Problem: Clinical Measurements: Goal: Will remain free from infection Outcome: Progressing   Problem: Clinical Measurements: Goal: Diagnostic test results will improve Outcome: Progressing   Problem: Elimination: Goal: Will not experience complications related to bowel motility Outcome: Progressing

## 2022-01-11 NOTE — Assessment & Plan Note (Signed)
-   patient had further bleeding from lower abdominal incision - underwent staple removal of lower incision and started on BID dressing changes with saline moistened gauze per surgery

## 2022-01-11 NOTE — Progress Notes (Signed)
Pt sitting on the side of the bed and I asked if he had changed his mind about the NGT, and again he said No and dont ask me again

## 2022-01-11 NOTE — Consult Note (Signed)
Loop Nurse ostomy follow up Checked on patient High Output Pouch to make sure it was still functional and draining appropriately. Still intact and functioning with bedside drainage bag over 1/4 full brown liquid stool. Patient sitting on the side of the bed and eating ice chips. Ordered pressure redistribution chair pad if patient desires to get up in chair. Kellie Simmering # for HOP pouch added to ostomy supply orders. Will follow-up again next week.   Cathlean Marseilles [name], MSN, RN, [place], Lysle Pearl, [facility] Wound Treatment Associate Pager [phone]

## 2022-01-11 NOTE — Progress Notes (Signed)
Electrolyte replacement in TPN patient  K 3.1, Mg 2.1, Phos 2.7  Plan: Will give KCl 34mq IV x 6 runs overnight. F/u K, Mg, and Phos in a.m.  CSherlon Handing PharmD, BCPS Please see amion for complete clinical pharmacist phone list 01/11/2022 10:20 PM

## 2022-01-11 NOTE — Progress Notes (Signed)
Went in to do abd assessment and noted that the dsg was bloody and draining.  Contacted the PA and she came in and took out stitches and packed the open wound with wet saline gauze and stated that it should be done BID.  Also explained to the pt the neccessity of having a NGT and again he refused.  The ;PA explained in details why it is very necessary and again he said not

## 2022-01-12 DIAGNOSIS — K56609 Unspecified intestinal obstruction, unspecified as to partial versus complete obstruction: Secondary | ICD-10-CM | POA: Diagnosis not present

## 2022-01-12 LAB — CBC WITH DIFFERENTIAL/PLATELET
Abs Immature Granulocytes: 0 10*3/uL (ref 0.00–0.07)
Basophils Absolute: 0.2 10*3/uL — ABNORMAL HIGH (ref 0.0–0.1)
Basophils Relative: 1 %
Eosinophils Absolute: 0.6 10*3/uL — ABNORMAL HIGH (ref 0.0–0.5)
Eosinophils Relative: 4 %
HCT: 31.7 % — ABNORMAL LOW (ref 39.0–52.0)
Hemoglobin: 10.6 g/dL — ABNORMAL LOW (ref 13.0–17.0)
Lymphocytes Relative: 10 %
Lymphs Abs: 1.6 10*3/uL (ref 0.7–4.0)
MCH: 25.1 pg — ABNORMAL LOW (ref 26.0–34.0)
MCHC: 33.4 g/dL (ref 30.0–36.0)
MCV: 75.1 fL — ABNORMAL LOW (ref 80.0–100.0)
Monocytes Absolute: 1.4 10*3/uL — ABNORMAL HIGH (ref 0.1–1.0)
Monocytes Relative: 9 %
Neutro Abs: 12.1 10*3/uL — ABNORMAL HIGH (ref 1.7–7.7)
Neutrophils Relative %: 76 %
Platelets: 432 10*3/uL — ABNORMAL HIGH (ref 150–400)
RBC: 4.22 MIL/uL (ref 4.22–5.81)
RDW: 17.5 % — ABNORMAL HIGH (ref 11.5–15.5)
WBC: 15.9 10*3/uL — ABNORMAL HIGH (ref 4.0–10.5)
nRBC: 0.3 % — ABNORMAL HIGH (ref 0.0–0.2)
nRBC: 1 /100 WBC — ABNORMAL HIGH

## 2022-01-12 LAB — PHOSPHORUS
Phosphorus: 2.3 mg/dL — ABNORMAL LOW (ref 2.5–4.6)
Phosphorus: 2.5 mg/dL (ref 2.5–4.6)

## 2022-01-12 LAB — BASIC METABOLIC PANEL
Anion gap: 13 (ref 5–15)
Anion gap: 11 (ref 5–15)
BUN: 31 mg/dL — ABNORMAL HIGH (ref 8–23)
BUN: 25 mg/dL — ABNORMAL HIGH (ref 8–23)
CO2: 23 mmol/L (ref 22–32)
CO2: 21 mmol/L — ABNORMAL LOW (ref 22–32)
Calcium: 9.1 mg/dL (ref 8.9–10.3)
Calcium: 8.7 mg/dL — ABNORMAL LOW (ref 8.9–10.3)
Chloride: 103 mmol/L (ref 98–111)
Chloride: 104 mmol/L (ref 98–111)
Creatinine, Ser: 1 mg/dL (ref 0.61–1.24)
Creatinine, Ser: 0.7 mg/dL (ref 0.61–1.24)
GFR, Estimated: >60 mL/min (ref >60)
GFR, Estimated: >60 mL/min (ref >60)
Glucose, Bld: 144 mg/dL — ABNORMAL HIGH (ref 70–99)
Glucose, Bld: 170 mg/dL — ABNORMAL HIGH (ref 70–99)
Potassium: 3.6 mmol/L (ref 3.5–5.1)
Potassium: 3.3 mmol/L — ABNORMAL LOW (ref 3.5–5.1)
Sodium: 139 mmol/L (ref 135–145)
Sodium: 136 mmol/L (ref 135–145)

## 2022-01-12 LAB — GLUCOSE, CAPILLARY
Glucose-Capillary: 144 mg/dL — ABNORMAL HIGH (ref 70–99)
Glucose-Capillary: 149 mg/dL — ABNORMAL HIGH (ref 70–99)
Glucose-Capillary: 152 mg/dL — ABNORMAL HIGH (ref 70–99)
Glucose-Capillary: 152 mg/dL — ABNORMAL HIGH (ref 70–99)
Glucose-Capillary: 182 mg/dL — ABNORMAL HIGH (ref 70–99)

## 2022-01-12 LAB — TRIGLYCERIDES: Triglycerides: 236 mg/dL — ABNORMAL HIGH (ref <150)

## 2022-01-12 LAB — MAGNESIUM: Magnesium: 2.1 mg/dL (ref 1.7–2.4)

## 2022-01-12 MED ORDER — POTASSIUM CHLORIDE 10 MEQ/50ML IV SOLN
10.0000 meq | INTRAVENOUS | Status: DC
  Filled 2022-01-12 (×3): qty 50

## 2022-01-12 MED ORDER — POTASSIUM CHLORIDE 10 MEQ/50ML IV SOLN
10.0000 meq | INTRAVENOUS | Status: AC
  Administered 2022-01-12 – 2022-01-13 (×6): 10 meq via INTRAVENOUS
  Filled 2022-01-12 (×6): qty 50

## 2022-01-12 MED ORDER — TRAVASOL 10 % IV SOLN
INTRAVENOUS | Status: AC
  Filled 2022-01-12: qty 777.6

## 2022-01-12 MED ORDER — POTASSIUM PHOSPHATES 15 MMOLE/5ML IV SOLN
30.0000 mmol | Freq: Once | INTRAVENOUS | Status: AC
  Administered 2022-01-12: 30 mmol via INTRAVENOUS
  Filled 2022-01-12: qty 5

## 2022-01-12 MED ORDER — POTASSIUM CHLORIDE 10 MEQ/50ML IV SOLN
10.0000 meq | INTRAVENOUS | Status: AC
  Administered 2022-01-12 (×6): 10 meq via INTRAVENOUS
  Filled 2022-01-12 (×6): qty 50

## 2022-01-12 NOTE — Progress Notes (Signed)
Progress Note    Carl Barr   NWG:[id number]  DOB: 02-19-1956  DOA: 01/03/2022     9 PCP: Antony Blackbird, MD  Initial CC: N/V  Hospital Course: Carl Barr is a 66 yo male with PMH lumbar DDD who presented with N/V and inability to pass gas/bowel movements.  CT abdomen/pelvis on admission noted 2 areas of stricture in the colon involving the splenic flexure and sigmoid colon.  Area was concerning for malignancy.  He was evaluated by general surgery.  He underwent left sigmoid and distal transverse colectomy with creation of colostomy on 01/04/2022.  He then developed a postop ileus with ongoing high output.  Interval History:  No events overnight.  Still asking for something to drink when seen this morning.  Otherwise doing okay.  Assessment and Plan: * Large bowel obstruction (HCC) - CT abdomen/pelvis on admission noted 2 areas of stricture in the colon involving the splenic flexure and sigmoid colon. -Underwent sigmoid and distal transverse colectomy with creation of colostomy on 01/04/2022 -continues to have high output liquid stool from ostomy with ongoing concern for ileus - Follow-up surgical pathology as well - General surgery managing, appreciate assistance -Did not tolerate CLD well with recurrent vomiting.  Patient still refusing NG tube placement despite surgery recommendations - PICC line placed on 01/11/2022 and TPN started  Open abdominal wall wound - patient had further bleeding from lower abdominal incision this morning - underwent staple removal of lower incision and started on BID dressing changes with saline moistened gauze per surgery   Hypokalemia - Replete as needed  Poor social situation - Patient lives at home with his cousin. He has very poor health literacy at baseline; with his new colostomy, there is concern for patient being able to handle this at time of discharge.  We will need to clarify whether or not cousin at home could accommodate managing this.   Tentatively, patient may discharge to SNF but long-term disposition will need to be considered in general  Malnutrition of moderate degree - Patient's BMI is Body mass index is 27.54 kg/m.. - Patient has the following signs/symptoms consistent with PCM: (Mild fat depletion, moderate muscle depletion) - seen by RD, appreciate assistance. Continue plan per RD to include thiamine added to TPN - Trend electrolytes   Hypophosphatemia - Replete as needed  Hypomagnesemia - Replete as needed   Alcohol use - No further concern for any withdrawal given prolonged hospitalization   Old records reviewed in assessment of this patient  Antimicrobials:   DVT prophylaxis:  enoxaparin (LOVENOX) injection 40 mg Start: 01/07/22 1130 SCDs Start: 01/04/22 0616   Code Status:   Code Status: Full Code  Mobility Assessment (last 72 hours)     Mobility Assessment     Row Name 01/12/22 0908 01/11/22 2000 01/10/22 2025 01/10/22 1557 01/10/22 1300   Does patient have an order for bedrest or is patient medically unstable No - Continue assessment No - Continue assessment No - Continue assessment No - Continue assessment --   What is the highest level of mobility based on the progressive mobility assessment? Level 2 (Chairfast) - Balance while sitting on edge of bed and cannot stand Level 2 (Chairfast) - Balance while sitting on edge of bed and cannot stand Level 2 (Chairfast) - Balance while sitting on edge of bed and cannot stand Level 4 (Walks with assist in room) - Balance while marching in place and cannot step forward and back - Complete Level 4 (Walks with assist in  room) - Balance while marching in place and cannot step forward and back - Complete   Is the above level different from baseline mobility prior to current illness? -- -- Yes - Recommend PT order Yes - Recommend PT order --    Row Name 01/09/22 2000           Does patient have an order for bedrest or is patient medically unstable No -  Continue assessment       What is the highest level of mobility based on the progressive mobility assessment? Level 4 (Walks with assist in room) - Balance while marching in place and cannot step forward and back - Complete       Is the above level different from baseline mobility prior to current illness? Yes - Recommend PT order                Disposition Plan:  SNF Status is: Inpt  Objective: Blood pressure (!) 138/91, pulse 94, temperature 98 F (36.7 C), temperature source Oral, resp. rate 16, height '5\' 9"'$  (1.753 m), weight 91.1 kg, SpO2 98 %.  Examination:  Physical Exam Constitutional:      General: He is not in acute distress.    Appearance: Normal appearance.  HENT:     Head: Normocephalic and atraumatic.     Mouth/Throat:     Mouth: Mucous membranes are moist.  Eyes:     Extraocular Movements: Extraocular movements intact.  Cardiovascular:     Rate and Rhythm: Normal rate and regular rhythm.     Heart sounds: Normal heart sounds.  Pulmonary:     Effort: Pulmonary effort is normal. No respiratory distress.     Breath sounds: Normal breath sounds. No wheezing.  Abdominal:     Comments: Soft, nontender, nondistended.  Bowel sounds hypoactive.  Ostomy bag in place with liquid stool brown  Musculoskeletal:        General: Normal range of motion.     Cervical back: Normal range of motion and neck supple.  Skin:    General: Skin is warm and dry.  Neurological:     General: No focal deficit present.     Mental Status: He is alert.  Psychiatric:        Mood and Affect: Mood normal.        Behavior: Behavior normal.      Consultants:  General surgery  Procedures:  Left sigmoid and distal transverse colectomy with colostomy creation, 01/04/2022  Data Reviewed: Results for orders placed or performed during the hospital encounter of 01/03/22 (from the past 24 hour(s))  Basic metabolic panel     Status: Abnormal   Collection Time: 01/11/22  9:17 PM  Result Value  Ref Range   Sodium 141 135 - 145 mmol/L   Potassium 3.1 (L) 3.5 - 5.1 mmol/L   Chloride 103 98 - 111 mmol/L   CO2 24 22 - 32 mmol/L   Glucose, Bld 125 (H) 70 - 99 mg/dL   BUN 28 (H) 8 - 23 mg/dL   Creatinine, Ser 1.19 0.61 - 1.24 mg/dL   Calcium 9.1 8.9 - 10.3 mg/dL   GFR, Estimated >60 >60 mL/min   Anion gap 14 5 - 15  Magnesium     Status: None   Collection Time: 01/11/22  9:17 PM  Result Value Ref Range   Magnesium 2.1 1.7 - 2.4 mg/dL  Phosphorus     Status: None   Collection Time: 01/11/22  9:17 PM  Result  Value Ref Range   Phosphorus 2.7 2.5 - 4.6 mg/dL  Glucose, capillary     Status: Abnormal   Collection Time: 01/12/22 12:05 AM  Result Value Ref Range   Glucose-Capillary 152 (H) 70 - 99 mg/dL  Basic metabolic panel     Status: Abnormal   Collection Time: 01/12/22  3:16 AM  Result Value Ref Range   Sodium 139 135 - 145 mmol/L   Potassium 3.6 3.5 - 5.1 mmol/L   Chloride 103 98 - 111 mmol/L   CO2 23 22 - 32 mmol/L   Glucose, Bld 144 (H) 70 - 99 mg/dL   BUN 31 (H) 8 - 23 mg/dL   Creatinine, Ser 1.00 0.61 - 1.24 mg/dL   Calcium 9.1 8.9 - 10.3 mg/dL   GFR, Estimated >60 >60 mL/min   Anion gap 13 5 - 15  CBC with Differential/Platelet     Status: Abnormal   Collection Time: 01/12/22  3:16 AM  Result Value Ref Range   WBC 15.9 (H) 4.0 - 10.5 K/uL   RBC 4.22 4.22 - 5.81 MIL/uL   Hemoglobin 10.6 (L) 13.0 - 17.0 g/dL   HCT 31.7 (L) 39.0 - 52.0 %   MCV 75.1 (L) 80.0 - 100.0 fL   MCH 25.1 (L) 26.0 - 34.0 pg   MCHC 33.4 30.0 - 36.0 g/dL   RDW 17.5 (H) 11.5 - 15.5 %   Platelets 432 (H) 150 - 400 K/uL   nRBC 0.3 (H) 0.0 - 0.2 %   Neutrophils Relative % 76 %   Neutro Abs 12.1 (H) 1.7 - 7.7 K/uL   Lymphocytes Relative 10 %   Lymphs Abs 1.6 0.7 - 4.0 K/uL   Monocytes Relative 9 %   Monocytes Absolute 1.4 (H) 0.1 - 1.0 K/uL   Eosinophils Relative 4 %   Eosinophils Absolute 0.6 (H) 0.0 - 0.5 K/uL   Basophils Relative 1 %   Basophils Absolute 0.2 (H) 0.0 - 0.1 K/uL   nRBC  1 (H) 0 /100 WBC   Abs Immature Granulocytes 0.00 0.00 - 0.07 K/uL  Magnesium     Status: None   Collection Time: 01/12/22  3:16 AM  Result Value Ref Range   Magnesium 2.1 1.7 - 2.4 mg/dL  Phosphorus     Status: Abnormal   Collection Time: 01/12/22  3:16 AM  Result Value Ref Range   Phosphorus 2.3 (L) 2.5 - 4.6 mg/dL  Triglycerides     Status: Abnormal   Collection Time: 01/12/22  3:16 AM  Result Value Ref Range   Triglycerides 236 (H) <150 mg/dL  Glucose, capillary     Status: Abnormal   Collection Time: 01/12/22  5:48 AM  Result Value Ref Range   Glucose-Capillary 144 (H) 70 - 99 mg/dL  Glucose, capillary     Status: Abnormal   Collection Time: 01/12/22 11:36 AM  Result Value Ref Range   Glucose-Capillary 152 (H) 70 - 99 mg/dL  Glucose, capillary     Status: Abnormal   Collection Time: 01/12/22  5:51 PM  Result Value Ref Range   Glucose-Capillary 149 (H) 70 - 99 mg/dL   Comment 1 Notify RN     I have Reviewed nursing notes, Vitals, and Lab results since pt's last encounter. Pertinent lab results : see above I have ordered test including BMP, CBC, Mg I have reviewed the last note from staff over past 24 hours I have discussed pt's care plan and test results with nursing staff, case manager  LOS: 9 days   Dwyane Dee, MD [facility] 01/12/2022, 5:56 PM

## 2022-01-12 NOTE — TOC Progression Note (Signed)
Transition of Care [facility]) - Progression Note    Patient Details  Name: Carl Barr MRN: [id number] Date of Birth: 20-Nov-1955  Transition of Care [facility]) CM/SW [place], [name] Phone Number: 01/12/2022, 11:15 AM  Clinical Narrative:    CSW received consult for possible SNF placement at time of discharge. CSW spoke with patient. Patient reported that he lives with his cousin who does not help him. Patient expressed understanding of PT recommendation and is agreeable to SNF placement at time of discharge. Patient reports preference for [facility] location. CSW discussed insurance authorization process and will provide Medicare SNF ratings list. CSW will send out referrals for review once off TPN.   CSW discussed substance use with patient but he stated that he does "not have anything at the house" right now and declines need for resources.   Skilled Nursing Rehab Facilities-   [email or web address]   Ratings out of 5 possible   Name Address  Phone # [place] Inspection Overall  [facility] [address], [place] '4 5 2 3  '$ [facility]  [address], [place] [phone] '3 2 5 5  '$ [facility] [place], [place] '3 1 1 1  '$ [place], [place] '3 2 4 4  '$ [facility] [address], [place] '1 1 2 1  '$ [place] '2 1 4 3  '$ [facility] [address], [place] '5 2 3 4  '$ [facility] [address], [place] '5 2 2 3  '$ [address] ([place]) [place], [place] [phone] '5 1 2 2  '$ [facility] [phone] [address], [place] [phone] '4 1 2 1  '$ [facility] [address] [facility] [phone] '4 1 2 1  '$ [facility] ([place]) [place]. [place], [place] [phone] '4 1 1 1   '$ [facility] [address] [phone] '3 2 4 4          '$ [facility] [place] '4 2 3 3  '$ [facility] [place] [address] [place] '4 1 5 4  '$ [facility], [place], [address] [phone] '2 1 1 1  '$ [facility] [address] [phone] '2 1 3 2          '$ [address] (no [facility]) [place] [address], [place] [phone] '4 5 5 5  '$ [facility] (No Humana) [address] [phone] [address], [place] '3 1 4 3  '$ [facility] (No UHC) [place], [place] '5 5 5 5  '$ [facility] [address] [phone] '3 2 4 4  '$ [place] [address] [place] '1 1 2 1  '$ [place] [address] [place] '2 1 1 1  '$ [place], [place] '5 2 4 5  '$ [facility] [address] [place] '3 1 1 1  '$ [facility] [place], [place] '2 1 2 1          '$ [facility] [address] [place] [phone] '1 1 1 1  '$ [place] [address] [place]  [phone] '2 4 2 2  '$ [facility] [place] [address], [place] [phone] '5 2 3 4  '$ [place], [place] [phone] '2 1 1 1  '$ [place] (No Humana) [address], [place] [phone] '2 1 3 2  '$ [facility] [address], [place] [phone] '3 1 1 1          '$ [facility] [place], [place] '5 4 5 5  '$ [facility])  [address], [place] '2 2 3 3  '$ [facility]) [place] [address] [place] '3 2 4 4  '$ [place] [address] [place] '4 3 4 4  '$ [address] [place], [place] '3 3 1 1  '$ [facility]) [address]  [place] [phone] '2 2 4 4      '$ Expected Discharge Plan: [place] Barriers to Discharge: Continued Medical Work up, SNF Pending bed offer  Expected Discharge Plan and Services Expected Discharge Plan: [place] In-house Referral: Clinical Social Work Discharge Planning Services: CM Consult Post Acute Care Choice: [place] arrangements for the past 2 months: [place]                   DME Agency:  (Await PT eval)       HH Arranged: RN (Await PT eval) [place] Agency: [place] [facility] [place]         Social Determinants of Health (SDOH) Interventions    Readmission Risk Interventions     No data to display

## 2022-01-12 NOTE — Progress Notes (Signed)
PHARMACY - TOTAL PARENTERAL NUTRITION CONSULT NOTE   Indication: Post-op ileus  Patient Measurements: Height: '5\' 9"'$  (175.3 cm) Weight: 91.1 kg (200 lb 13.4 oz) IBW/kg (Calculated) : 70.7 TPN AdjBW (KG): 81.6 Body mass index is 29.66 kg/m. Usual Weight: patient does not know. Last charted weight was 231 lbs in Jan 2021   Assessment:  66 yo M with colonic obstruction secondary to sigmoid mass s/p open L hemicolectomy with end colostomy 6/30. Patient failed CLD 7/6, had emesis with persistent nausea. NGT is recommended but patient is refusing. Imaging shows post-operative ileus. Patient with no/poor PO intake > 7 days. Patient is requiring very high amounts of K repletion with persistently low K, likely due to high losses in ostomy. Patient is at risk of refeeding. Pharmacy consulted for TPN.   Glucose / Insulin: BG < 180, no hx DM Electrolytes: K 3.6 (Goal >/= 4), CoCa 10.4, phos 2.3, others wnl  Renal: Scr 0.64>1, BUN wnl Hepatic: LFT/Tbili wnl, albumin 2 Intake / Output; MIVF: LR '@75ml'$ /hr;  UOP 0.4 ml/kg/hr, ostomy output 3900 ml, feculent  GI Imaging: 6/29 CT abd- 2 strictures in colon with bowel obstruction, possible colitis or malignancy 7/3 AbdXR- post-op ileus  7/5 AbdXR- partial SBO or ileus, worse than prior 7/5 CT abd- post-op, likely ileus  GI Surgeries / Procedures:  6/30 open L hemicolectomy with end colostomy  Central access: 7/7 PICC ordered  TPN start date: 7/7  Nutritional Goals: Goal TPN rate is 100 mL/hr (provides 130 g of protein, 362g dextrose, 80g lipids, and 2510 kcals per day)  RD Assessment: pending Estimated Needs Total Energy Estimated Needs: 2000-2300kcal/day Total Protein Estimated Needs: 100-115g/day Total Fluid Estimated Needs: 1.9-2.2L/day  Current Nutrition:  NPO and TPN  Plan:  Increase TPN to 60 mL/hr at 1800 (meeting ~60% of estimated needs) Electrolytes in TPN: Na 65 mEq/L (limited by TPN volume), K 80 mEq/L (max in TPN), Ca 5 mEq/L, Mg  10 mEq/L, and Phos 24 mmol/L. Cl:Ac 1:2 Add standard MVI and trace elements to TPN Add folate/thiamine to TPN to reduce refeeding risk Give another 60 mEq of IV K  Give 30 mmol IV KPhos Initiate Sensitive q6h SSI and adjust as needed  Reduce LR to 30 mL/hr at 1800 Additional fluids per team  F/u repeat BMP, Mag, Phos at 21:00 to check for refeeding, replete electrolytes aggressively Monitor TPN labs daily until stable at goal then on Mon/Thurs   Alanda Slim, PharmD, [place] Pharmacist Please see AMION for all Pharmacists' Contact Phone Numbers 01/12/2022, 7:49 AM

## 2022-01-12 NOTE — Progress Notes (Signed)
Patient ID: Latif Nazareno, male   DOB: 21-Mar-1956, 66 y.o.   MRN: [id number]   Acute Care Surgery Service Progress Note:    Chief Complaint/Subjective: Denies any more n/v Wants to drink something other than ice chips Denies abd pain  Objective: Vital signs in last 24 hours: Temp:  [97.5 F (36.4 C)-98.1 F (36.7 C)] 98.1 F (36.7 C) (07/08 0804) Pulse Rate:  [90-106] 90 (07/08 0804) Resp:  [16-20] 16 (07/08 0804) BP: (110-132)/(81-97) 132/97 (07/08 0804) SpO2:  [94 %-99 %] 99 % (07/08 0804) Weight:  [84.6 kg-91.1 kg] 91.1 kg (07/08 0500) Last BM Date : 01/11/22  Intake/Output from previous day: 07/07 0701 - 07/08 0700 In: 2558.5 [I.V.:1340.1; IV Piggyback:1218.4] Out: 4800 [Urine:900; DTOIZ:1245] Intake/Output this shift: No intake/output data recorded.  General: WD, obese male who is laying in bed in NAD Heart: regular, rate, and rhythm.  Lungs: Respiratory effort nonlabored Abd: soft, NT, full/fluffy abdomen, +BS, incision C/D/I with staples present, ostomy viable with liquid output,  Lab Results: CBC  Recent Labs    01/11/22 0040 01/12/22 0316  WBC 18.5* 15.9*  HGB 11.2* 10.6*  HCT 33.9* 31.7*  PLT 401* 432*   BMET Recent Labs    01/11/22 2117 01/12/22 0316  NA 141 139  K 3.1* 3.6  CL 103 103  CO2 24 23  GLUCOSE 125* 144*  BUN 28* 31*  CREATININE 1.19 1.00  CALCIUM 9.1 9.1   LFT    Latest Ref Rng & Units 01/09/2022   12:50 AM 01/03/2022    5:14 PM 08/01/2019    6:54 AM  Hepatic Function  Total Protein 6.5 - 8.1 g/dL 6.2  8.2  8.9   Albumin 3.5 - 5.0 g/dL 2.0  3.4  4.1   AST 15 - 41 U/L _0 ALT 0 - 44 U/L _1 Alk Phosphatase 38 - 126 U/L 39  57  77   Total Bilirubin 0.3 - 1.2 mg/dL 1.0  1.2  0.8    PT/INR No results for input(s): "LABPROT", "INR" in the last 72 hours. ABG No results for input(s): "PHART", "HCO3" in the last 72 hours.  Invalid input(s): "PCO2", "PO2"  Studies/Results:  Anti-infectives: Anti-infectives  (From admission, onward)    Start     Dose/Rate Route Frequency Ordered Stop   01/04/22 0830  cefoTEtan (CEFOTAN) 2 g in sodium chloride 0.9 % 100 mL IVPB        2 g 200 mL/hr over 30 Minutes Intravenous  Once 01/04/22 0805 01/04/22 1010       Medications: Scheduled Meds:  Chlorhexidine Gluconate Cloth  6 each Topical Daily   enoxaparin (LOVENOX) injection  40 mg Subcutaneous Q24H   feeding supplement  1 Container Oral TID BM   insulin aspart  0-9 Units Subcutaneous Q6H   sodium chloride flush  10-40 mL Intracatheter Q12H   Continuous Infusions:  lactated ringers 50 mL/hr at 01/12/22 0606   methocarbamol (ROBAXIN) IV 1,000 mg (01/12/22 0509)   potassium chloride 10 mEq (01/12/22 1008)   potassium PHOSPHATE IVPB (in mmol) 30 mmol (01/12/22 0903)   TPN ADULT (ION) 40 mL/hr at 01/11/22 2144   TPN ADULT (ION)     PRN Meds:.HYDROmorphone (DILAUDID) injection, sodium chloride flush  Assessment/Plan: Patient Active Problem List   Diagnosis Date Noted   Malnutrition of moderate degree 01/11/2022   Open abdominal wall wound 01/11/2022   Poor social situation 01/10/2022   Hypomagnesemia 01/09/2022  Hypophosphatemia 01/09/2022   Alcohol use 01/04/2022   Hypokalemia 01/04/2022   Large bowel obstruction ([place]) 01/03/2022   Lumbar degenerative disc disease 12/16/2018   Chronic pain of left knee 12/16/2018   Encounter for long-term (current) use of medications 12/16/2018   s/p Procedure(s): LEFT SIGMOID AND DISTAL TRANSVERSE COLECTOMY COLOSTOMY 01/04/2022  Colonic obstruction secondary to sigmoid mass and thickening of splenic flexure POD#8 S/P open extended left hemicolectomy with end colostomy - surgical pathology is still pending, CEA was mildly elevated at 5.5 - CT 7/5: with ileus but no abscess or leak noted, mild wall thickening of transverse colon  - putting out large volume liquid stool from ostomy  ~3900 cc in last 24 hrs but having nausea and vomiting  - needs to  mobilize more  -  PICC and TPN, pt currently refusing NGT Hypokalemia - K ok  this AM,    FEN: NPO, IVF_0  cc/h VTE: LMWH ID: [email or web address] 6/30    ?Hx of alcohol abuse  Disposition: will allow CLD - not sure how it will go; if vomiting - NPO with ice chips  LOS: 9 days    Leighton Ruff. Redmond Pulling, MD, FACS General, Bariatric, & Minimally Invasive Surgery [phone] [facility]

## 2022-01-13 DIAGNOSIS — K56609 Unspecified intestinal obstruction, unspecified as to partial versus complete obstruction: Secondary | ICD-10-CM | POA: Diagnosis not present

## 2022-01-13 LAB — BASIC METABOLIC PANEL
Anion gap: 13 (ref 5–15)
Anion gap: 10 (ref 5–15)
BUN: 24 mg/dL — ABNORMAL HIGH (ref 8–23)
BUN: 20 mg/dL (ref 8–23)
CO2: 23 mmol/L (ref 22–32)
CO2: 25 mmol/L (ref 22–32)
Calcium: 9.2 mg/dL (ref 8.9–10.3)
Calcium: 8.6 mg/dL — ABNORMAL LOW (ref 8.9–10.3)
Chloride: 105 mmol/L (ref 98–111)
Chloride: 101 mmol/L (ref 98–111)
Creatinine, Ser: 0.66 mg/dL (ref 0.61–1.24)
Creatinine, Ser: 0.55 mg/dL — ABNORMAL LOW (ref 0.61–1.24)
GFR, Estimated: >60 mL/min (ref >60)
GFR, Estimated: >60 mL/min (ref >60)
Glucose, Bld: 149 mg/dL — ABNORMAL HIGH (ref 70–99)
Glucose, Bld: 141 mg/dL — ABNORMAL HIGH (ref 70–99)
Potassium: 4.3 mmol/L (ref 3.5–5.1)
Potassium: 4.1 mmol/L (ref 3.5–5.1)
Sodium: 141 mmol/L (ref 135–145)
Sodium: 136 mmol/L (ref 135–145)

## 2022-01-13 LAB — CBC WITH DIFFERENTIAL/PLATELET
Abs Immature Granulocytes: 0.2 10*3/uL — ABNORMAL HIGH (ref 0.00–0.07)
Basophils Absolute: 0 10*3/uL (ref 0.0–0.1)
Basophils Relative: 0 %
Eosinophils Absolute: 0.2 10*3/uL (ref 0.0–0.5)
Eosinophils Relative: 1 %
HCT: 32.6 % — ABNORMAL LOW (ref 39.0–52.0)
Hemoglobin: 10.9 g/dL — ABNORMAL LOW (ref 13.0–17.0)
Lymphocytes Relative: 9 %
Lymphs Abs: 1.8 10*3/uL (ref 0.7–4.0)
MCH: 24.9 pg — ABNORMAL LOW (ref 26.0–34.0)
MCHC: 33.4 g/dL (ref 30.0–36.0)
MCV: 74.6 fL — ABNORMAL LOW (ref 80.0–100.0)
Monocytes Absolute: 3.2 10*3/uL — ABNORMAL HIGH (ref 0.1–1.0)
Monocytes Relative: 16 %
Myelocytes: 1 %
Neutro Abs: 14.6 10*3/uL — ABNORMAL HIGH (ref 1.7–7.7)
Neutrophils Relative %: 73 %
Platelets: 496 10*3/uL — ABNORMAL HIGH (ref 150–400)
RBC: 4.37 MIL/uL (ref 4.22–5.81)
RDW: 18 % — ABNORMAL HIGH (ref 11.5–15.5)
WBC: 20 10*3/uL — ABNORMAL HIGH (ref 4.0–10.5)
nRBC: 0 /100 WBC
nRBC: 0.2 % (ref 0.0–0.2)

## 2022-01-13 LAB — PHOSPHORUS
Phosphorus: 2.4 mg/dL — ABNORMAL LOW (ref 2.5–4.6)
Phosphorus: 3.2 mg/dL (ref 2.5–4.6)

## 2022-01-13 LAB — GLUCOSE, CAPILLARY
Glucose-Capillary: 151 mg/dL — ABNORMAL HIGH (ref 70–99)
Glucose-Capillary: 153 mg/dL — ABNORMAL HIGH (ref 70–99)
Glucose-Capillary: 121 mg/dL — ABNORMAL HIGH (ref 70–99)

## 2022-01-13 LAB — C DIFFICILE (CDIFF) QUICK SCRN (NO PCR REFLEX)
C Diff antigen: NEGATIVE
C Diff interpretation: NOT DETECTED
C Diff toxin: NEGATIVE

## 2022-01-13 LAB — MAGNESIUM
Magnesium: 2.3 mg/dL (ref 1.7–2.4)
Magnesium: 2.2 mg/dL (ref 1.7–2.4)
Magnesium: 2.4 mg/dL (ref 1.7–2.4)

## 2022-01-13 MED ORDER — TRAVASOL 10 % IV SOLN
INTRAVENOUS | Status: AC
  Filled 2022-01-13: qty 1296

## 2022-01-13 MED ORDER — POTASSIUM PHOSPHATES 15 MMOLE/5ML IV SOLN
15.0000 mmol | Freq: Once | INTRAVENOUS | Status: AC
  Administered 2022-01-13: 15 mmol via INTRAVENOUS
  Filled 2022-01-13: qty 5

## 2022-01-13 NOTE — Progress Notes (Addendum)
PHARMACY - TOTAL PARENTERAL NUTRITION CONSULT NOTE   Indication: Post-op ileus  Patient Measurements: Height: '5\' 9"'$  (175.3 cm) Weight: 92.4 kg (203 lb 11.3 oz) IBW/kg (Calculated) : 70.7 TPN AdjBW (KG): 81.6 Body mass index is 30.08 kg/m. Usual Weight: patient does not know. Last charted weight was 231 lbs in Jan 2021   Assessment:  66 yo M with colonic obstruction secondary to sigmoid mass s/p open L hemicolectomy with end colostomy 6/30. Patient failed CLD 7/6, had emesis with persistent nausea. NGT is recommended but patient is refusing. Imaging shows post-operative ileus. Patient with no/poor PO intake > 7 days. Patient is requiring very high amounts of K repletion with persistently low K, likely due to high losses in ostomy. Patient is at risk of refeeding. Pharmacy consulted for TPN.   Glucose / Insulin: BG < 180, no hx DM Electrolytes: K 4.3 (Goal >/= 4), CoCa 10.4, phos 2.4, others wnl  Renal: Scr 0.64>1, BUN wnl Hepatic: LFT/Tbili wnl, albumin 2 Intake / Output; MIVF: LR '@50ml'$ /hr;  UOP 0.5 ml/kg/hr, ostomy output 1350 ml, feculent  GI Imaging: 6/29 CT abd- 2 strictures in colon with bowel obstruction, possible colitis or malignancy 7/3 AbdXR- post-op ileus  7/5 AbdXR- partial SBO or ileus, worse than prior 7/5 CT abd- post-op, likely ileus  GI Surgeries / Procedures:  6/30 open L hemicolectomy with end colostomy  Central access: 7/7 PICC ordered  TPN start date: 7/7  Nutritional Goals: Goal TPN rate is 100 mL/hr (provides 130 g of protein, 362g dextrose, 80g lipids, and 2510 kcals per day)  RD Assessment: pending Estimated Needs Total Energy Estimated Needs: 2000-2300kcal/day Total Protein Estimated Needs: 100-115g/day Total Fluid Estimated Needs: 1.9-2.2L/day  Current Nutrition:  NPO and TPN  Plan:  Increase TPN to 100 mL/hr at 1800 (meeting ~100% of estimated needs) Electrolytes in TPN: Na 65 mEq/L (limited by TPN volume), K 80 mEq/L (max in TPN), Ca 5 mEq/L,  Mg 10 mEq/L, and Phos 24 mmol/L. Cl:Ac 1:2 Add standard MVI and trace elements to TPN Add folate/thiamine to TPN to reduce refeeding risk Can D/c thiamine from TPN with 7/10 bag Give 15 mmol IV KPhos Initiate Sensitive q6h SSI and adjust as needed  D/c LR  at 1800 Additional fluids per team  F/u repeat BMP, Mag, Phos at 21:00 to check for refeeding, replete electrolytes aggressively Monitor TPN on Mon/Thurs   Alanda Slim, PharmD, [facility] Clinical Pharmacist Please see AMION for all Pharmacists' Contact Phone Numbers 01/13/2022, 7:48 AM

## 2022-01-13 NOTE — Progress Notes (Signed)
Progress Note    Carl Barr   OHY:[id number]  DOB: March 10, 1956  DOA: 01/03/2022     10 PCP: Antony Blackbird, MD  Initial CC: N/V  Hospital Course: Carl Barr is a 66 yo male with PMH lumbar DDD who presented with N/V and inability to pass gas/bowel movements.  CT abdomen/pelvis on admission noted 2 areas of stricture in the colon involving the splenic flexure and sigmoid colon.  Area was concerning for malignancy.  He was evaluated by general surgery.  He underwent left sigmoid and distal transverse colectomy with creation of colostomy on 01/04/2022.  He then developed a postop ileus with ongoing high output.  Interval History:  No events overnight. Still had some vomiting it sounds like. Made NPO again per surgery but okay for ice chips and water.   Assessment and Plan: * Large bowel obstruction (HCC) - CT abdomen/pelvis on admission noted 2 areas of stricture in the colon involving the splenic flexure and sigmoid colon. -Underwent sigmoid and distal transverse colectomy with creation of colostomy on 01/04/2022 -continues to have high output liquid stool from ostomy with ongoing concern for ileus - Follow-up surgical pathology as well - General surgery managing, appreciate assistance -Did not tolerate CLD well with recurrent vomiting.  Patient still refusing NG tube placement despite surgery recommendations - PICC line placed on 01/11/2022 and TPN started - follow up cdiff given ongoing watery ostomy output  Open abdominal wall wound - patient had further bleeding from lower abdominal incision this morning - underwent staple removal of lower incision and started on BID dressing changes with saline moistened gauze per surgery   Hypokalemia - Replete as needed  Poor social situation - Patient lives at home with his cousin. He has very poor health literacy at baseline; with his new colostomy, there is concern for patient being able to handle this at time of discharge.  We will need to  clarify whether or not cousin at home could accommodate managing this.  Tentatively, patient may discharge to SNF but long-term disposition will need to be considered in general  Malnutrition of moderate degree - Patient's BMI is Body mass index is 27.54 kg/m.. - Patient has the following signs/symptoms consistent with PCM: (Mild fat depletion, moderate muscle depletion) - seen by RD, appreciate assistance. Continue plan per RD to include thiamine added to TPN - Trend electrolytes  Hypophosphatemia - Replete as needed  Hypomagnesemia - Replete as needed   Alcohol use - No further concern for any withdrawal given prolonged hospitalization   Old records reviewed in assessment of this patient  Antimicrobials:   DVT prophylaxis:  enoxaparin (LOVENOX) injection 40 mg Start: 01/07/22 1130 SCDs Start: 01/04/22 0616   Code Status:   Code Status: Full Code  Mobility Assessment (last 72 hours)     Mobility Assessment     Row Name 01/13/22 0727 01/12/22 2015 01/12/22 0908 01/11/22 2000 01/10/22 2025   Does patient have an order for bedrest or is patient medically unstable No - Continue assessment No - Continue assessment No - Continue assessment No - Continue assessment No - Continue assessment   What is the highest level of mobility based on the progressive mobility assessment? Level 2 (Chairfast) - Balance while sitting on edge of bed and cannot stand Level 2 (Chairfast) - Balance while sitting on edge of bed and cannot stand Level 2 (Chairfast) - Balance while sitting on edge of bed and cannot stand Level 2 (Chairfast) - Balance while sitting on edge of bed  and cannot stand Level 2 (Chairfast) - Balance while sitting on edge of bed and cannot stand   Is the above level different from baseline mobility prior to current illness? Yes - Recommend PT order Yes - Recommend PT order -- -- Yes - Recommend PT order    [place] Name 01/10/22 1557           Does patient have an order for bedrest  or is patient medically unstable No - Continue assessment       What is the highest level of mobility based on the progressive mobility assessment? Level 4 (Walks with assist in room) - Balance while marching in place and cannot step forward and back - Complete       Is the above level different from baseline mobility prior to current illness? Yes - Recommend PT order                Disposition Plan:  SNF Status is: Inpt  Objective: Blood pressure 123/89, pulse 98, temperature 97.6 F (36.4 C), temperature source Oral, resp. rate 18, height '5\' 9"'$  (1.753 m), weight 92.4 kg, SpO2 99 %.  Examination:  Physical Exam Constitutional:      General: He is not in acute distress.    Appearance: Normal appearance.  HENT:     Head: Normocephalic and atraumatic.     Mouth/Throat:     Mouth: Mucous membranes are moist.  Eyes:     Extraocular Movements: Extraocular movements intact.  Cardiovascular:     Rate and Rhythm: Normal rate and regular rhythm.     Heart sounds: Normal heart sounds.  Pulmonary:     Effort: Pulmonary effort is normal. No respiratory distress.     Breath sounds: Normal breath sounds. No wheezing.  Abdominal:     Comments: Soft, nontender, nondistended.  Bowel sounds hypoactive.  Ostomy bag in place with liquid stool brown  Musculoskeletal:        General: Normal range of motion.     Cervical back: Normal range of motion and neck supple.  Skin:    General: Skin is warm and dry.  Neurological:     General: No focal deficit present.     Mental Status: He is alert.  Psychiatric:        Mood and Affect: Mood normal.        Behavior: Behavior normal.      Consultants:  General surgery  Procedures:  Left sigmoid and distal transverse colectomy with colostomy creation, 01/04/2022  Data Reviewed: Results for orders placed or performed during the hospital encounter of 01/03/22 (from the past 24 hour(s))  Glucose, capillary     Status: Abnormal   Collection Time:  01/12/22  5:51 PM  Result Value Ref Range   Glucose-Capillary 149 (H) 70 - 99 mg/dL   Comment 1 Notify RN   Basic metabolic panel     Status: Abnormal   Collection Time: 01/12/22  9:57 PM  Result Value Ref Range   Sodium 136 135 - 145 mmol/L   Potassium 3.3 (L) 3.5 - 5.1 mmol/L   Chloride 104 98 - 111 mmol/L   CO2 21 (L) 22 - 32 mmol/L   Glucose, Bld 170 (H) 70 - 99 mg/dL   BUN 25 (H) 8 - 23 mg/dL   Creatinine, Ser 0.70 0.61 - 1.24 mg/dL   Calcium 8.7 (L) 8.9 - 10.3 mg/dL   GFR, Estimated >60 >60 mL/min   Anion gap 11 5 - 15  Phosphorus  Status: None   Collection Time: 01/12/22  9:57 PM  Result Value Ref Range   Phosphorus 2.5 2.5 - 4.6 mg/dL  Magnesium     Status: None   Collection Time: 01/12/22  9:57 PM  Result Value Ref Range   Magnesium 2.3 1.7 - 2.4 mg/dL  Glucose, capillary     Status: Abnormal   Collection Time: 01/12/22 11:57 PM  Result Value Ref Range   Glucose-Capillary 182 (H) 70 - 99 mg/dL  Basic metabolic panel     Status: Abnormal   Collection Time: 01/13/22  3:41 AM  Result Value Ref Range   Sodium 141 135 - 145 mmol/L   Potassium 4.3 3.5 - 5.1 mmol/L   Chloride 105 98 - 111 mmol/L   CO2 23 22 - 32 mmol/L   Glucose, Bld 149 (H) 70 - 99 mg/dL   BUN 24 (H) 8 - 23 mg/dL   Creatinine, Ser 0.66 0.61 - 1.24 mg/dL   Calcium 9.2 8.9 - 10.3 mg/dL   GFR, Estimated >60 >60 mL/min   Anion gap 13 5 - 15  CBC with Differential/Platelet     Status: Abnormal   Collection Time: 01/13/22  3:41 AM  Result Value Ref Range   WBC 20.0 (H) 4.0 - 10.5 K/uL   RBC 4.37 4.22 - 5.81 MIL/uL   Hemoglobin 10.9 (L) 13.0 - 17.0 g/dL   HCT 32.6 (L) 39.0 - 52.0 %   MCV 74.6 (L) 80.0 - 100.0 fL   MCH 24.9 (L) 26.0 - 34.0 pg   MCHC 33.4 30.0 - 36.0 g/dL   RDW 18.0 (H) 11.5 - 15.5 %   Platelets 496 (H) 150 - 400 K/uL   nRBC 0.2 0.0 - 0.2 %   Neutrophils Relative % 73 %   Neutro Abs 14.6 (H) 1.7 - 7.7 K/uL   Lymphocytes Relative 9 %   Lymphs Abs 1.8 0.7 - 4.0 K/uL   Monocytes  Relative 16 %   Monocytes Absolute 3.2 (H) 0.1 - 1.0 K/uL   Eosinophils Relative 1 %   Eosinophils Absolute 0.2 0.0 - 0.5 K/uL   Basophils Relative 0 %   Basophils Absolute 0.0 0.0 - 0.1 K/uL   nRBC 0 0 /100 WBC   Myelocytes 1 %   Abs Immature Granulocytes 0.20 (H) 0.00 - 0.07 K/uL   Polychromasia PRESENT   Magnesium     Status: None   Collection Time: 01/13/22  3:41 AM  Result Value Ref Range   Magnesium 2.2 1.7 - 2.4 mg/dL  Phosphorus     Status: Abnormal   Collection Time: 01/13/22  3:41 AM  Result Value Ref Range   Phosphorus 2.4 (L) 2.5 - 4.6 mg/dL  Glucose, capillary     Status: Abnormal   Collection Time: 01/13/22  6:21 AM  Result Value Ref Range   Glucose-Capillary 151 (H) 70 - 99 mg/dL  C Difficile Quick Screen (NO PCR Reflex)     Status: None   Collection Time: 01/13/22  9:19 AM   Specimen: STOOL  Result Value Ref Range   C Diff antigen NEGATIVE NEGATIVE   C Diff toxin NEGATIVE NEGATIVE   C Diff interpretation No C. difficile detected.   Glucose, capillary     Status: Abnormal   Collection Time: 01/13/22 12:28 PM  Result Value Ref Range   Glucose-Capillary 153 (H) 70 - 99 mg/dL    I have Reviewed nursing notes, Vitals, and Lab results since pt's last encounter. Pertinent lab results :  see above I have ordered test including BMP, CBC, Mg I have reviewed the last note from staff over past 24 hours I have discussed pt's care plan and test results with nursing staff, case manager   LOS: 10 days   Dwyane Dee, MD [facility] 01/13/2022, 3:13 PM

## 2022-01-13 NOTE — Progress Notes (Signed)
Patient ID: Carl Barr, male   DOB: 24-Jun-1956, 66 y.o.   MRN: [id number]   Acute Care Surgery Service Progress Note:    Chief Complaint/Subjective: Had 2 episodes of n/v after ensures. States he didn't have issues with water Wants to drink something other than ice chips Some bloating Colostomy output 1350cc/24hrs  Objective: Vital signs in last 24 hours: Temp:  [97.4 F (36.3 C)-98.3 F (36.8 C)] 97.4 F (36.3 C) (07/09 0620) Pulse Rate:  [94-103] 96 (07/09 0620) Resp:  [16-18] 18 (07/09 0620) BP: (119-138)/(81-91) 119/81 (07/09 0620) SpO2:  [95 %-99 %] 99 % (07/09 0620) Weight:  [92.4 kg] 92.4 kg (07/09 0500) Last BM Date : 01/13/22  Intake/Output from previous day: 07/08 0701 - 07/09 0700 In: 3056.4 [P.O.:360; I.V.:1861.5; IV Piggyback:835] Out: 2450 [Urine:1100; Stool:1350] Intake/Output this shift: Total I/O In: -  Out: 1400 [Stool:1400]  General: WD, obese male who is laying in bed in NAD Heart: regular, rate, and rhythm.  Lungs: Respiratory effort nonlabored Abd: soft, NT, full/fluffy abdomen, +BS, incision C/D/I with staples present, ostomy viable with very liquid output,  Lab Results: CBC  Recent Labs    01/12/22 0316 01/13/22 0341  WBC 15.9* 20.0*  HGB 10.6* 10.9*  HCT 31.7* 32.6*  PLT 432* 496*    BMET Recent Labs    01/12/22 2157 01/13/22 0341  NA 136 141  K 3.3* 4.3  CL 104 105  CO2 21* 23  GLUCOSE 170* 149*  BUN 25* 24*  CREATININE 0.70 0.66  CALCIUM 8.7* 9.2    LFT    Latest Ref Rng & Units 01/09/2022   12:50 AM 01/03/2022    5:14 PM 08/01/2019    6:54 AM  Hepatic Function  Total Protein 6.5 - 8.1 g/dL 6.2  8.2  8.9   Albumin 3.5 - 5.0 g/dL 2.0  3.4  4.1   AST 15 - 41 U/L $Remo'19  17  31   'EUJSl$ ALT 0 - 44 U/L $Remo'16  16  28   'BHyDC$ Alk Phosphatase 38 - 126 U/L 39  57  77   Total Bilirubin 0.3 - 1.2 mg/dL 1.0  1.2  0.8    PT/INR No results for input(s): "LABPROT", "INR" in the last 72 hours. ABG No results for input(s): "PHART", "HCO3" in the  last 72 hours.  Invalid input(s): "PCO2", "PO2"  Studies/Results:  Anti-infectives: Anti-infectives (From admission, onward)    Start     Dose/Rate Route Frequency Ordered Stop   01/04/22 0830  cefoTEtan (CEFOTAN) 2 g in sodium chloride 0.9 % 100 mL IVPB        2 g 200 mL/hr over 30 Minutes Intravenous  Once 01/04/22 0805 01/04/22 1010       Medications: Scheduled Meds:  Chlorhexidine Gluconate Cloth  6 each Topical Daily   enoxaparin (LOVENOX) injection  40 mg Subcutaneous Q24H   feeding supplement  1 Container Oral TID BM   insulin aspart  0-9 Units Subcutaneous Q6H   sodium chloride flush  10-40 mL Intracatheter Q12H   Continuous Infusions:  lactated ringers 50 mL/hr at 01/12/22 0606   methocarbamol (ROBAXIN) IV 1,000 mg (01/13/22 0629)   potassium PHOSPHATE IVPB (in mmol)     TPN ADULT (ION) 60 mL/hr at 01/12/22 1839   TPN ADULT (ION)     PRN Meds:.HYDROmorphone (DILAUDID) injection, sodium chloride flush  Assessment/Plan: Patient Active Problem List   Diagnosis Date Noted   Malnutrition of moderate degree 01/11/2022   Open abdominal wall wound 01/11/2022  Poor social situation 01/10/2022   Hypomagnesemia 01/09/2022   Hypophosphatemia 01/09/2022   Alcohol use 01/04/2022   Hypokalemia 01/04/2022   Large bowel obstruction ([place]) 01/03/2022   Lumbar degenerative disc disease 12/16/2018   Chronic pain of left knee 12/16/2018   Encounter for long-term (current) use of medications 12/16/2018   s/p Procedure(s): LEFT SIGMOID AND DISTAL TRANSVERSE COLECTOMY COLOSTOMY 01/04/2022  Colonic obstruction secondary to sigmoid mass and thickening of splenic flexure POD#9 S/P open extended left hemicolectomy with end colostomy - surgical pathology is still pending, CEA was mildly elevated at 5.5 - CT 7/5: with ileus but no abscess or leak noted, mild wall thickening of transverse colon  - putting out large volume liquid stool from ostomy  ~1350 cc in last 24 hrs but had  recurrent nausea and vomiting when given CLD - needs to mobilize more  -  PICC and TPN, pt currently refusing NGT Hypokalemia - K ok  this AM,    FEN: NPO, IVF$RemoveBefo'@75'MwrDLusWxIa$  cc/h VTE: LMWH ID: [email or web address] 6/30    ?Hx of alcohol abuse  Disposition: change diet back to NPO except meds/water for recurrent vomiting; given persistent wbc, ileus, essentially high output watery colostomy output for days - will check cdiff  LOS: 10 days    Leighton Ruff. Redmond Pulling, MD, FACS General, Bariatric, & Minimally Invasive Surgery [phone] [facility]

## 2022-01-13 NOTE — Progress Notes (Signed)
Dressing change done on patient he tolerated well.

## 2022-01-14 DIAGNOSIS — K56609 Unspecified intestinal obstruction, unspecified as to partial versus complete obstruction: Secondary | ICD-10-CM | POA: Diagnosis not present

## 2022-01-14 LAB — GLUCOSE, CAPILLARY
Glucose-Capillary: 115 mg/dL — ABNORMAL HIGH (ref 70–99)
Glucose-Capillary: 119 mg/dL — ABNORMAL HIGH (ref 70–99)
Glucose-Capillary: 128 mg/dL — ABNORMAL HIGH (ref 70–99)
Glucose-Capillary: 136 mg/dL — ABNORMAL HIGH (ref 70–99)
Glucose-Capillary: 143 mg/dL — ABNORMAL HIGH (ref 70–99)
Glucose-Capillary: 97 mg/dL (ref 70–99)

## 2022-01-14 LAB — COMPREHENSIVE METABOLIC PANEL
ALT: 24 U/L (ref 0–44)
AST: 18 U/L (ref 15–41)
Albumin: 2 g/dL — ABNORMAL LOW (ref 3.5–5.0)
Alkaline Phosphatase: 47 U/L (ref 38–126)
Anion gap: 9 (ref 5–15)
BUN: 17 mg/dL (ref 8–23)
CO2: 24 mmol/L (ref 22–32)
Calcium: 8.5 mg/dL — ABNORMAL LOW (ref 8.9–10.3)
Chloride: 103 mmol/L (ref 98–111)
Creatinine, Ser: 0.51 mg/dL — ABNORMAL LOW (ref 0.61–1.24)
GFR, Estimated: >60 mL/min (ref >60)
Glucose, Bld: 132 mg/dL — ABNORMAL HIGH (ref 70–99)
Potassium: 4.1 mmol/L (ref 3.5–5.1)
Sodium: 136 mmol/L (ref 135–145)
Total Bilirubin: 0.5 mg/dL (ref 0.3–1.2)
Total Protein: 7.2 g/dL (ref 6.5–8.1)

## 2022-01-14 LAB — TRIGLYCERIDES: Triglycerides: 181 mg/dL — ABNORMAL HIGH (ref <150)

## 2022-01-14 LAB — CBC WITH DIFFERENTIAL/PLATELET
Abs Immature Granulocytes: 0 10*3/uL (ref 0.00–0.07)
Basophils Absolute: 0 10*3/uL (ref 0.0–0.1)
Basophils Relative: 0 %
Eosinophils Absolute: 0.3 10*3/uL (ref 0.0–0.5)
Eosinophils Relative: 1 %
HCT: 29.5 % — ABNORMAL LOW (ref 39.0–52.0)
Hemoglobin: 9.9 g/dL — ABNORMAL LOW (ref 13.0–17.0)
Lymphocytes Relative: 6 %
Lymphs Abs: 1.7 10*3/uL (ref 0.7–4.0)
MCH: 25.3 pg — ABNORMAL LOW (ref 26.0–34.0)
MCHC: 33.6 g/dL (ref 30.0–36.0)
MCV: 75.4 fL — ABNORMAL LOW (ref 80.0–100.0)
Monocytes Absolute: 5.5 10*3/uL — ABNORMAL HIGH (ref 0.1–1.0)
Monocytes Relative: 20 %
Neutro Abs: 20.2 10*3/uL — ABNORMAL HIGH (ref 1.7–7.7)
Neutrophils Relative %: 73 %
Platelets: 443 10*3/uL — ABNORMAL HIGH (ref 150–400)
RBC: 3.91 MIL/uL — ABNORMAL LOW (ref 4.22–5.81)
RDW: 18.5 % — ABNORMAL HIGH (ref 11.5–15.5)
WBC: 27.7 10*3/uL — ABNORMAL HIGH (ref 4.0–10.5)
nRBC: 0 /100 WBC
nRBC: 0.1 % (ref 0.0–0.2)

## 2022-01-14 LAB — PHOSPHORUS: Phosphorus: 3 mg/dL (ref 2.5–4.6)

## 2022-01-14 LAB — PATHOLOGIST SMEAR REVIEW

## 2022-01-14 LAB — MAGNESIUM: Magnesium: 2.4 mg/dL (ref 1.7–2.4)

## 2022-01-14 MED ORDER — TRAVASOL 10 % IV SOLN
INTRAVENOUS | Status: AC
  Filled 2022-01-14: qty 1296

## 2022-01-14 NOTE — Progress Notes (Signed)
Patient ID: Carl Barr, male   DOB: 1955/12/03, 66 y.o.   MRN: [id number]   Acute Care Surgery Service Progress Note:    Chief Complaint/Subjective: Denies nausea/vomiting. Drinking water. C diff negative, colostomy output still very high (>2L in 24 hours).  Objective: Vital signs in last 24 hours: Temp:  [97.6 F (36.4 C)-99 F (37.2 C)] 98.7 F (37.1 C) (07/10 0600) Pulse Rate:  [88-98] 88 (07/10 0600) Resp:  [17-20] 17 (07/10 0600) BP: (113-126)/(68-89) 113/68 (07/10 0600) SpO2:  [98 %-100 %] 100 % (07/10 0600) Weight:  [91.3 kg] 91.3 kg (07/10 0500) Last BM Date : 01/13/22  Intake/Output from previous day: 07/09 0701 - 07/10 0700 In: 3702.9 [P.O.:120; I.V.:3006.6; IV Piggyback:576.3] Out: 6500 [Urine:2500; WOEHO:1224] Intake/Output this shift: No intake/output data recorded.  General: WD, obese male who is laying in bed in NAD Heart: regular, rate, and rhythm.  Lungs: Respiratory effort nonlabored Abd: soft, nondistended; incision clean and dry, open at inferior aspect; colostomy is pink and productive of liquid stool.  Lab Results: CBC  Recent Labs    01/13/22 0341 01/14/22 0337  WBC 20.0* 27.7*  HGB 10.9* 9.9*  HCT 32.6* 29.5*  PLT 496* 443*   BMET Recent Labs    01/13/22 2259 01/14/22 0337  NA 136 136  K 4.1 4.1  CL 101 103  CO2 25 24  GLUCOSE 141* 132*  BUN 20 17  CREATININE 0.55* 0.51*  CALCIUM 8.6* 8.5*   LFT    Latest Ref Rng & Units 01/14/2022    3:37 AM 01/09/2022   12:50 AM 01/03/2022    5:14 PM  Hepatic Function  Total Protein 6.5 - 8.1 g/dL 7.2  6.2  8.2   Albumin 3.5 - 5.0 g/dL 2.0  2.0  3.4   AST 15 - 41 U/L $Remo'18  19  17   'SdPWo$ ALT 0 - 44 U/L $Remo'24  16  16   'GtJeT$ Alk Phosphatase 38 - 126 U/L 47  39  57   Total Bilirubin 0.3 - 1.2 mg/dL 0.5  1.0  1.2    PT/INR No results for input(s): "LABPROT", "INR" in the last 72 hours. ABG No results for input(s): "PHART", "HCO3" in the last 72 hours.  Invalid input(s): "PCO2",  "PO2"  Studies/Results:  Anti-infectives: Anti-infectives (From admission, onward)    Start     Dose/Rate Route Frequency Ordered Stop   01/04/22 0830  cefoTEtan (CEFOTAN) 2 g in sodium chloride 0.9 % 100 mL IVPB        2 g 200 mL/hr over 30 Minutes Intravenous  Once 01/04/22 0805 01/04/22 1010       Medications: Scheduled Meds:  Chlorhexidine Gluconate Cloth  6 each Topical Daily   enoxaparin (LOVENOX) injection  40 mg Subcutaneous Q24H   insulin aspart  0-9 Units Subcutaneous Q6H   sodium chloride flush  10-40 mL Intracatheter Q12H   Continuous Infusions:  lactated ringers 50 mL/hr at 01/14/22 0650   methocarbamol (ROBAXIN) IV Stopped (01/13/22 2111)   TPN ADULT (ION) 100 mL/hr at 01/13/22 1832   PRN Meds:.HYDROmorphone (DILAUDID) injection, sodium chloride flush  Assessment/Plan: Patient Active Problem List   Diagnosis Date Noted   Malnutrition of moderate degree 01/11/2022   Open abdominal wall wound 01/11/2022   Poor social situation 01/10/2022   Hypomagnesemia 01/09/2022   Hypophosphatemia 01/09/2022   Alcohol use 01/04/2022   Hypokalemia 01/04/2022   Large bowel obstruction ([place]) 01/03/2022   Lumbar degenerative disc disease 12/16/2018   Chronic pain of left  knee 12/16/2018   Encounter for long-term (current) use of medications 12/16/2018   s/p Procedure(s): LEFT SIGMOID AND DISTAL TRANSVERSE COLECTOMY COLOSTOMY 01/04/2022  Colonic obstruction secondary to sigmoid mass and thickening of splenic flexure POD#10 S/P open extended left hemicolectomy with end colostomy - surgical pathology is still pending, CEA was mildly elevated at 5.5 - CT 7/5: with ileus but no abscess or leak noted, mild wall thickening of transverse colon  - Large volume stool from ostomy: C diff negative 7/9. No vomiting, abdomen is soft. Advance to clear liquids. Continue TPN, monitor electrolytes. - needs to mobilize more   FEN: Clear liquid diet, TPN VTE: LMWH ID: [email or web address] 6/30     ?Hx of alcohol abuse    LOS: 11 days   Michaelle Birks, MD [facility] General, Hepatobiliary and Pancreatic Surgery 01/14/22 8:29 AM

## 2022-01-14 NOTE — Progress Notes (Signed)
Physical Therapy Treatment Patient Details Name: Carl Barr MRN: [id number] DOB: 1956/02/03 Today's Date: 01/14/2022   History of Present Illness 66 year old male without any past medical history presents to the ER with complaints of constipation with nausea and vomiting.  Found to have bowel obstruction with colonic stricture concerning for possible malignancy.  S/P Left sigmoid and distal transverse colectomy and colostomy creation on 6/30.  7/3, developed ileus with nausea and vomiting.  Continues to have output from the colostomy bag.  Unable to insert NG x2.    PT Comments    Patient is progressing well towards physical therapy goals. Reviewed bed mobility and transfers to improve safety and independence. Required min assist throughout. Tolerated exercises well, encouraged use between therapy sessions to improve strength and reduce rate of muscle atrophy due to prolonged hospitalization. Patient will continue to benefit from skilled physical therapy services to further improve independence with functional mobility.      Recommendations for follow up therapy are one component of a multi-disciplinary discharge planning process, led by the attending physician.  Recommendations may be updated based on patient status, additional functional criteria and insurance authorization.  Follow Up Recommendations  Skilled nursing-short term rehab (<3 hours/day) Can patient physically be transported by private vehicle: Yes   Assistance Recommended at Discharge Intermittent Supervision/Assistance  Patient can return home with the following A little help with bathing/dressing/bathroom;Assistance with cooking/housework;Direct supervision/assist for medications management;Assist for transportation;Help with stairs or ramp for entrance;A lot of help with walking and/or transfers   Equipment Recommendations  Rolling walker (2 wheels);BSC/3in1    Recommendations for Other Services       Precautions /  Restrictions Precautions Precautions: Fall Precaution Comments: colostomy Restrictions Weight Bearing Restrictions: No     Mobility  Bed Mobility Overal bed mobility: Needs Assistance Bed Mobility: Rolling, Sidelying to Sit Rolling: Min assist Sidelying to sit: Min assist       General bed mobility comments: Tactile cues and verbal cues to facilitate log roll technique, pushing through RUE to rise to EOB to avoid excessive strain on abdominals. Min assist for trunk support, and assist LEs all the way out of bed prior to rising.    Transfers Overall transfer level: Needs assistance Equipment used: Rolling walker (2 wheels) Transfers: Sit to/from Stand, Bed to chair/wheelchair/BSC Sit to Stand: Min assist   Step pivot transfers: Min assist       General transfer comment: Min assist for boost with sit<>stand, requires cues to facilitate, educated on hand placement. Use of RW upon standing. Practiced several times from bed. Step pivot transfer towards right side to chair with min assist for walker and tactile cues for sequencing. Managed multiple lines/tubes. Cues for breathing to assist with upright stance and relaxation of abdominals.    Ambulation/Gait                   Stairs             Wheelchair Mobility    Modified Rankin (Stroke Patients Only)       Balance Overall balance assessment: Needs assistance Sitting-balance support: No upper extremity supported, Feet supported Sitting balance-Leahy Scale: Fair     Standing balance support: Bilateral upper extremity supported Standing balance-Leahy Scale: Poor Standing balance comment: relies on UE support                            Cognition Arousal/Alertness: Awake/alert Behavior During Therapy: Flat affect Overall Cognitive  Status: Impaired/Different from baseline Area of Impairment: Following commands, Safety/judgement, Problem solving                       Following  Commands: Follows one step commands with increased time Safety/Judgement: Decreased awareness of safety, Decreased awareness of deficits   Problem Solving: Difficulty sequencing, Requires verbal cues, Decreased initiation, Slow processing          Exercises General Exercises - Lower Extremity Ankle Circles/Pumps: AROM, Strengthening, Both, 10 reps, Supine Quad Sets: Strengthening, Both, 10 reps, Supine Gluteal Sets: Strengthening, Both, 10 reps, Seated Long Arc Quad: Strengthening, Both, 20 reps, Seated Heel Slides: AAROM, Strengthening, Both, 10 reps, Supine Hip ABduction/ADduction: Strengthening, Both, 10 reps, Seated (pillow squeeze) Straight Leg Raises: AAROM, Strengthening, Both, 10 reps, Supine Other Exercises Other Exercises: verbal cues for exercise participation and technique for strengthening. no wound drainage noted    General Comments        Pertinent Vitals/Pain Pain Assessment Pain Assessment: Faces Faces Pain Scale: Hurts little more Pain Location: stomach Pain Descriptors / Indicators: Discomfort, Guarding Pain Intervention(s): Limited activity within patient's tolerance, Monitored during session, Repositioned    Home Living                          Prior Function            PT Goals (current goals can now be found in the care plan section) Acute Rehab PT Goals Patient Stated Goal: to go home PT Goal Formulation: With patient Time For Goal Achievement: 01/22/22 Potential to Achieve Goals: Good Progress towards PT goals: Progressing toward goals    Frequency    Min 3X/week      PT Plan Current plan remains appropriate    Co-evaluation              AM-PAC PT "6 Clicks" Mobility   Outcome Measure  Help needed turning from your back to your side while in a flat bed without using bedrails?: A Little Help needed moving from lying on your back to sitting on the side of a flat bed without using bedrails?: A Little Help needed  moving to and from a bed to a chair (including a wheelchair)?: A Lot Help needed standing up from a chair using your arms (e.g., wheelchair or bedside chair)?: A Lot Help needed to walk in hospital room?: A Lot Help needed climbing 3-5 steps with a railing? : Total 6 Click Score: 13    End of Session Equipment Utilized During Treatment: Gait belt Activity Tolerance: Patient tolerated treatment well Patient left: with call bell/phone within reach;in chair;with chair alarm set Nurse Communication: Mobility status PT Visit Diagnosis: Unsteadiness on feet (R26.81);Muscle weakness (generalized) (M62.81)     Time: 4742-5956 PT Time Calculation (min) (ACUTE ONLY): 25 min  Charges:  $Therapeutic Exercise: 8-22 mins $Therapeutic Activity: 8-22 mins                     Candie Mile, PT    Ellouise Newer 01/14/2022, 10:47 AM

## 2022-01-14 NOTE — Progress Notes (Signed)
PHARMACY - TOTAL PARENTERAL NUTRITION CONSULT NOTE   Indication: Post-op ileus  Patient Measurements: Height: '5\' 9"'$  (175.3 cm) Weight: 91.3 kg (201 lb 4.5 oz) IBW/kg (Calculated) : 70.7 TPN AdjBW (KG): 81.6 Body mass index is 29.72 kg/m. Usual Weight: patient does not know. Last charted weight was 231 lbs in Jan 2021   Assessment:  66 yo M with colonic obstruction secondary to sigmoid mass s/p open L hemicolectomy with end colostomy 6/30. Patient failed CLD 7/6, had emesis with persistent nausea. NGT is recommended but patient is refusing. Imaging shows post-operative ileus. Patient with no/poor PO intake > 7 days. Patient is requiring very high amounts of K repletion with persistently low K, likely due to high losses in ostomy. Patient is at risk of refeeding. Pharmacy consulted for TPN.   Glucose / Insulin: BG < 180, no hx DM Electrolytes: K 4.1 (Goal >/= 4), CoCa 10.1, phos 3.0, Mg 2.4, others wnl  Renal: Scr 0.51, BUN wnl Hepatic: LFT/Tbili wnl, albumin 2.0 Intake / Output; MIVF: LR '@50ml'$ /hr;  UOP 1928m, ostomy output 4200 ml, feculent   GI Imaging: 6/29 CT abd- 2 strictures in colon with bowel obstruction, possible colitis or malignancy 7/3 AbdXR- post-op ileus  7/5 AbdXR- partial SBO or ileus, worse than prior 7/5 CT abd- post-op, likely ileus  GI Surgeries / Procedures:  6/30 open L hemicolectomy with end colostomy  Central access: 7/07 PICC ordered  TPN start date: 7/07  Nutritional Goals: Goal TPN rate is 100 mL/hr (provides 130 g of protein, 362g dextrose, 80g lipids, and 2510 kcals per day)  RD Assessment: pending Estimated Needs Total Energy Estimated Needs: 2000-2300kcal/day Total Protein Estimated Needs: 100-115g/day Total Fluid Estimated Needs: 1.9-2.2L/day  Current Nutrition:  7/07 TPN 7/10 advancing to CLD  Plan:  Continue TPN to 100 mL/hr at 1800 (meeting ~100% of estimated needs) Electrolytes in TPN: Na 65 mEq/L (limited by TPN volume), K 80 mEq/L  (max in TPN), Ca 5 mEq/L, Mg 10 mEq/L, and Phos 24 mmol/L. Cl:Ac 1:2 Add standard MVI and trace elements to TPN Add folate to TPN to reduce refeeding risk D/c thiamine from TPN with 7/10 bag  (received x3 days) Initiate Sensitive q6h SSI and adjust as needed  Additional fluids per team (LR @ 573md/t high ostomy output) Monitor TPN labs on Mon/Thurs   Thank you for allowing pharmacy to be a part of this patient's care.  AdArdyth HarpsPharmD Clinical Pharmacist

## 2022-01-14 NOTE — Progress Notes (Signed)
Progress Note    Carl Barr   GGE:[id number]  DOB: 1956-05-25  DOA: 01/03/2022     11 PCP: Carl Blackbird, MD  Initial CC: N/V  Hospital Course: [name] is a 66 yo male with PMH lumbar DDD who presented with N/V and inability to pass gas/bowel movements.  CT abdomen/pelvis on admission noted 2 areas of stricture in the colon involving the splenic flexure and sigmoid colon.  Area was concerning for malignancy.  He was evaluated by general surgery.  He underwent left sigmoid and distal transverse colectomy with creation of colostomy on 01/04/2022.  He then developed a postop ileus with ongoing high output.  Interval History:  No events overnight.  Resting in bed when seen.  Still seems to have poor understanding of ostomy care.  Tried asking him if he would be able to manage his ostomy after potential rehab when he goes "home".  He still could not quite answer the question to show good enough understanding so still not sure what his long-term plan would be at this point.  Assessment and Plan: * Large bowel obstruction (HCC) - CT abdomen/pelvis on admission noted 2 areas of stricture in the colon involving the splenic flexure and sigmoid colon. -Underwent sigmoid and distal transverse colectomy with creation of colostomy on 01/04/2022 -continues to have high output liquid stool from ostomy with ongoing concern for ileus - Follow-up surgical pathology as well - General surgery managing, appreciate assistance -Did not tolerate CLD well with recurrent vomiting.  Patient still refusing NG tube placement despite surgery recommendations - PICC line placed on 01/11/2022 and TPN started -C. difficile negative on 01/13/2022  Open abdominal wall wound - patient had further bleeding from lower abdominal incision this morning - underwent staple removal of lower incision and started on BID dressing changes with saline moistened gauze per surgery   Hypokalemia - Replete as needed  Poor social  situation - Patient lives at home with his cousin. He has very poor health literacy at baseline; with his new colostomy, there is concern for patient being able to handle this at time of discharge.  We will need to clarify whether or not cousin at home could accommodate managing this.  Tentatively, patient may discharge to SNF but long-term disposition will need to be considered in general  Malnutrition of moderate degree - Patient's BMI is Body mass index is 27.54 kg/m.. - Patient has the following signs/symptoms consistent with PCM: (Mild fat depletion, moderate muscle depletion) - seen by RD, appreciate assistance. Continue plan per RD to include thiamine added to TPN - Trend electrolytes  Hypophosphatemia - Replete as needed  Hypomagnesemia - Replete as needed   Alcohol use - No further concern for any withdrawal given prolonged hospitalization   Old records reviewed in assessment of this patient  Antimicrobials:   DVT prophylaxis:  enoxaparin (LOVENOX) injection 40 mg Start: 01/07/22 1130 SCDs Start: 01/04/22 0616   Code Status:   Code Status: Full Code  Mobility Assessment (last 72 hours)     Mobility Assessment     Row Name 01/14/22 1000 01/14/22 0800 01/13/22 2000 01/13/22 0727 01/12/22 2015   Does patient have an order for bedrest or is patient medically unstable -- No - Continue assessment No - Continue assessment No - Continue assessment No - Continue assessment   What is the highest level of mobility based on the progressive mobility assessment? Level 3 (Stands with assist) - Balance while standing  and cannot march in place Level 4 (  Walks with assist in room) - Balance while marching in place and cannot step forward and back - Complete Level 2 (Chairfast) - Balance while sitting on edge of bed and cannot stand Level 2 (Chairfast) - Balance while sitting on edge of bed and cannot stand Level 2 (Chairfast) - Balance while sitting on edge of bed and cannot stand   Is  the above level different from baseline mobility prior to current illness? -- Yes - Recommend PT order Yes - Recommend PT order Yes - Recommend PT order Yes - Recommend PT order    Row Name 01/12/22 0908 01/11/22 2000         Does patient have an order for bedrest or is patient medically unstable No - Continue assessment No - Continue assessment      What is the highest level of mobility based on the progressive mobility assessment? Level 2 (Chairfast) - Balance while sitting on edge of bed and cannot stand Level 2 (Chairfast) - Balance while sitting on edge of bed and cannot stand               Disposition Plan:  SNF Status is: Inpt  Objective: Blood pressure 115/75, pulse 77, temperature 97.9 F (36.6 C), temperature source Oral, resp. rate 16, height '5\' 9"'$  (1.753 m), weight 91.3 kg, SpO2 99 %.  Examination:  Physical Exam Constitutional:      General: He is not in acute distress.    Appearance: Normal appearance.  HENT:     Head: Normocephalic and atraumatic.     Mouth/Throat:     Mouth: Mucous membranes are moist.  Eyes:     Extraocular Movements: Extraocular movements intact.  Cardiovascular:     Rate and Rhythm: Normal rate and regular rhythm.     Heart sounds: Normal heart sounds.  Pulmonary:     Effort: Pulmonary effort is normal. No respiratory distress.     Breath sounds: Normal breath sounds. No wheezing.  Abdominal:     Comments: Soft, nontender, nondistended.  Bowel sounds hypoactive.  Ostomy bag in place with liquid stool brown  Musculoskeletal:        General: Normal range of motion.     Cervical back: Normal range of motion and neck supple.  Skin:    General: Skin is warm and dry.  Neurological:     General: No focal deficit present.     Mental Status: He is alert.  Psychiatric:        Mood and Affect: Mood normal.        Behavior: Behavior normal.      Consultants:  General surgery  Procedures:  Left sigmoid and distal transverse colectomy  with colostomy creation, 01/04/2022  Data Reviewed: Results for orders placed or performed during the hospital encounter of 01/03/22 (from the past 24 hour(s))  Glucose, capillary     Status: Abnormal   Collection Time: 01/13/22  5:29 PM  Result Value Ref Range   Glucose-Capillary 121 (H) 70 - 99 mg/dL  Basic metabolic panel     Status: Abnormal   Collection Time: 01/13/22 10:59 PM  Result Value Ref Range   Sodium 136 135 - 145 mmol/L   Potassium 4.1 3.5 - 5.1 mmol/L   Chloride 101 98 - 111 mmol/L   CO2 25 22 - 32 mmol/L   Glucose, Bld 141 (H) 70 - 99 mg/dL   BUN 20 8 - 23 mg/dL   Creatinine, Ser 0.55 (L) 0.61 - 1.24 mg/dL  Calcium 8.6 (L) 8.9 - 10.3 mg/dL   GFR, Estimated >60 >60 mL/min   Anion gap 10 5 - 15  Magnesium     Status: None   Collection Time: 01/13/22 10:59 PM  Result Value Ref Range   Magnesium 2.4 1.7 - 2.4 mg/dL  Phosphorus     Status: None   Collection Time: 01/13/22 10:59 PM  Result Value Ref Range   Phosphorus 3.2 2.5 - 4.6 mg/dL  Glucose, capillary     Status: Abnormal   Collection Time: 01/14/22 12:18 AM  Result Value Ref Range   Glucose-Capillary 128 (H) 70 - 99 mg/dL  CBC with Differential/Platelet     Status: Abnormal   Collection Time: 01/14/22  3:37 AM  Result Value Ref Range   WBC 27.7 (H) 4.0 - 10.5 K/uL   RBC 3.91 (L) 4.22 - 5.81 MIL/uL   Hemoglobin 9.9 (L) 13.0 - 17.0 g/dL   HCT 29.5 (L) 39.0 - 52.0 %   MCV 75.4 (L) 80.0 - 100.0 fL   MCH 25.3 (L) 26.0 - 34.0 pg   MCHC 33.6 30.0 - 36.0 g/dL   RDW 18.5 (H) 11.5 - 15.5 %   Platelets 443 (H) 150 - 400 K/uL   nRBC 0.1 0.0 - 0.2 %   Neutrophils Relative % 73 %   Neutro Abs 20.2 (H) 1.7 - 7.7 K/uL   Lymphocytes Relative 6 %   Lymphs Abs 1.7 0.7 - 4.0 K/uL   Monocytes Relative 20 %   Monocytes Absolute 5.5 (H) 0.1 - 1.0 K/uL   Eosinophils Relative 1 %   Eosinophils Absolute 0.3 0.0 - 0.5 K/uL   Basophils Relative 0 %   Basophils Absolute 0.0 0.0 - 0.1 K/uL   nRBC 0 0 /100 WBC   Abs  Immature Granulocytes 0.00 0.00 - 0.07 K/uL   Target Cells PRESENT   Comprehensive metabolic panel     Status: Abnormal   Collection Time: 01/14/22  3:37 AM  Result Value Ref Range   Sodium 136 135 - 145 mmol/L   Potassium 4.1 3.5 - 5.1 mmol/L   Chloride 103 98 - 111 mmol/L   CO2 24 22 - 32 mmol/L   Glucose, Bld 132 (H) 70 - 99 mg/dL   BUN 17 8 - 23 mg/dL   Creatinine, Ser 0.51 (L) 0.61 - 1.24 mg/dL   Calcium 8.5 (L) 8.9 - 10.3 mg/dL   Total Protein 7.2 6.5 - 8.1 g/dL   Albumin 2.0 (L) 3.5 - 5.0 g/dL   AST 18 15 - 41 U/L   ALT 24 0 - 44 U/L   Alkaline Phosphatase 47 38 - 126 U/L   Total Bilirubin 0.5 0.3 - 1.2 mg/dL   GFR, Estimated >60 >60 mL/min   Anion gap 9 5 - 15  Magnesium     Status: None   Collection Time: 01/14/22  3:37 AM  Result Value Ref Range   Magnesium 2.4 1.7 - 2.4 mg/dL  Phosphorus     Status: None   Collection Time: 01/14/22  3:37 AM  Result Value Ref Range   Phosphorus 3.0 2.5 - 4.6 mg/dL  Triglycerides     Status: Abnormal   Collection Time: 01/14/22  3:37 AM  Result Value Ref Range   Triglycerides 181 (H) <150 mg/dL  Glucose, capillary     Status: Abnormal   Collection Time: 01/14/22  5:57 AM  Result Value Ref Range   Glucose-Capillary 143 (H) 70 - 99 mg/dL  Glucose, capillary  Status: Abnormal   Collection Time: 01/14/22 12:15 PM  Result Value Ref Range   Glucose-Capillary 119 (H) 70 - 99 mg/dL  Glucose, capillary     Status: Abnormal   Collection Time: 01/14/22  4:48 PM  Result Value Ref Range   Glucose-Capillary 136 (H) 70 - 99 mg/dL    I have Reviewed nursing notes, Vitals, and Lab results since pt's last encounter. Pertinent lab results : see above I have ordered test including BMP, CBC, Mg I have reviewed the last note from staff over past 24 hours I have discussed pt's care plan and test results with nursing staff, case manager   LOS: 11 days   Dwyane Dee, MD [facility] 01/14/2022, 5:06 PM

## 2022-01-15 ENCOUNTER — Inpatient Hospital Stay (HOSPITAL_COMMUNITY): Payer: Medicare Other

## 2022-01-15 DIAGNOSIS — K56609 Unspecified intestinal obstruction, unspecified as to partial versus complete obstruction: Secondary | ICD-10-CM | POA: Diagnosis not present

## 2022-01-15 DIAGNOSIS — C189 Malignant neoplasm of colon, unspecified: Secondary | ICD-10-CM

## 2022-01-15 LAB — BASIC METABOLIC PANEL
Anion gap: 5 (ref 5–15)
BUN: 11 mg/dL (ref 8–23)
CO2: 25 mmol/L (ref 22–32)
Calcium: 8.3 mg/dL — ABNORMAL LOW (ref 8.9–10.3)
Chloride: 104 mmol/L (ref 98–111)
Creatinine, Ser: 0.44 mg/dL — ABNORMAL LOW (ref 0.61–1.24)
GFR, Estimated: >60 mL/min (ref >60)
Glucose, Bld: 126 mg/dL — ABNORMAL HIGH (ref 70–99)
Potassium: 4.6 mmol/L (ref 3.5–5.1)
Sodium: 134 mmol/L — ABNORMAL LOW (ref 135–145)

## 2022-01-15 LAB — PHOSPHORUS: Phosphorus: 3.6 mg/dL (ref 2.5–4.6)

## 2022-01-15 LAB — GLUCOSE, CAPILLARY: Glucose-Capillary: 99 mg/dL (ref 70–99)

## 2022-01-15 MED ORDER — TRAVASOL 10 % IV SOLN
INTRAVENOUS | Status: AC
  Filled 2022-01-15: qty 1296

## 2022-01-15 NOTE — Assessment & Plan Note (Signed)
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

## 2022-01-15 NOTE — Progress Notes (Signed)
Mobility Specialist Progress Note:   01/15/22 1030  Mobility  Activity Transferred from bed to chair  Level of Assistance Minimal assist, patient does 75% or more  Assistive Device Front wheel walker  Distance Ambulated (ft) 3 ft  Activity Response Tolerated well  $Mobility charge 1 Mobility   Pt agreeable to mobility session. Required minA of 1 to stand and pivot to chair. Pt left with all needs met, call bell in reach.   Nelta Numbers Acute Rehab Secure Chat or Office Phone: [phone]

## 2022-01-15 NOTE — Progress Notes (Signed)
Patient ID: Carl Barr, male   DOB: 1955/12/21, 66 y.o.   MRN: [id number]   Acute Care Surgery Service Progress Note:    Chief Complaint/Subjective: Tolerating clear liquids, reports occasional nausea but he has not had any vomiting (confirmed with RN). Total colostomy output 1550 in last 24 hours.  Objective: Vital signs in last 24 hours: Temp:  [97.9 F (36.6 C)-98.5 F (36.9 C)] 98 F (36.7 C) (07/11 0730) Pulse Rate:  [75-84] 75 (07/11 0730) Resp:  [16-19] 19 (07/11 0730) BP: (115-132)/(63-100) 121/66 (07/11 0730) SpO2:  [90 %-99 %] 99 % (07/11 0730) Weight:  [93.8 kg-94.1 kg] 94.1 kg (07/11 0730) Last BM Date : 01/14/22  Intake/Output from previous day: 07/10 0701 - 07/11 0700 In: 4909.2 [P.O.:980; I.V.:3489.2; IV Piggyback:440] Out: 9562 [Urine:2300; ZHYQM:5784] Intake/Output this shift: No intake/output data recorded.  General: WD, obese male who is laying in bed in NAD Lungs: Respiratory effort nonlabored Abd: soft, nondistended; incision clean and dry, open at inferior aspect, fascia in tact. Colostomy is pink and productive of loose stool.  Lab Results: CBC  Recent Labs    01/13/22 0341 01/14/22 0337  WBC 20.0* 27.7*  HGB 10.9* 9.9*  HCT 32.6* 29.5*  PLT 496* 443*   BMET Recent Labs    01/14/22 0337 01/15/22 0400  NA 136 134*  K 4.1 4.6  CL 103 104  CO2 24 25  GLUCOSE 132* 126*  BUN 17 11  CREATININE 0.51* 0.44*  CALCIUM 8.5* 8.3*   LFT    Latest Ref Rng & Units 01/14/2022    3:37 AM 01/09/2022   12:50 AM 01/03/2022    5:14 PM  Hepatic Function  Total Protein 6.5 - 8.1 g/dL 7.2  6.2  8.2   Albumin 3.5 - 5.0 g/dL 2.0  2.0  3.4   AST 15 - 41 U/L $Remo'18  19  17   'Vkmof$ ALT 0 - 44 U/L $Remo'24  16  16   'ncdrN$ Alk Phosphatase 38 - 126 U/L 47  39  57   Total Bilirubin 0.3 - 1.2 mg/dL 0.5  1.0  1.2    PT/INR No results for input(s): "LABPROT", "INR" in the last 72 hours. ABG No results for input(s): "PHART", "HCO3" in the last 72 hours.  Invalid input(s):  "PCO2", "PO2"  Studies/Results:  Anti-infectives: Anti-infectives (From admission, onward)    Start     Dose/Rate Route Frequency Ordered Stop   01/04/22 0830  cefoTEtan (CEFOTAN) 2 g in sodium chloride 0.9 % 100 mL IVPB        2 g 200 mL/hr over 30 Minutes Intravenous  Once 01/04/22 0805 01/04/22 1010       Medications: Scheduled Meds:  Chlorhexidine Gluconate Cloth  6 each Topical Daily   enoxaparin (LOVENOX) injection  40 mg Subcutaneous Q24H   sodium chloride flush  10-40 mL Intracatheter Q12H   Continuous Infusions:  lactated ringers 50 mL/hr at 01/15/22 0255   methocarbamol (ROBAXIN) IV 1,000 mg (01/15/22 6962)   TPN ADULT (ION) 100 mL/hr at 01/14/22 1754   TPN ADULT (ION)     PRN Meds:.HYDROmorphone (DILAUDID) injection, sodium chloride flush  Assessment/Plan: Patient Active Problem List   Diagnosis Date Noted   Malnutrition of moderate degree 01/11/2022   Open abdominal wall wound 01/11/2022   Poor social situation 01/10/2022   Hypomagnesemia 01/09/2022   Hypophosphatemia 01/09/2022   Alcohol use 01/04/2022   Hypokalemia 01/04/2022   Large bowel obstruction ([place]) 01/03/2022   Lumbar degenerative disc disease 12/16/2018  Chronic pain of left knee 12/16/2018   Encounter for long-term (current) use of medications 12/16/2018   s/p Procedure(s): LEFT SIGMOID AND DISTAL TRANSVERSE COLECTOMY COLOSTOMY 01/04/2022  Colonic obstruction secondary to sigmoid mass and thickening of splenic flexure POD#11 S/P open extended left hemicolectomy with end colostomy - Surgical pathology shows a well-differentiated colon adenocarcinoma, stage [id number]. Will obtain a chest CT for complete staging while inpatient. Patient will need referral to medical oncology. - CT 7/5: with ileus but no abscess or leak noted, mild wall thickening of transverse colon - Large volume stool from ostomy: C diff negative 7/9. Abdomen soft, now tolerating clear liquids. Advance to full liquids.  Anticipate colostomy output will thicken and decrease once patient is eating solid food. - Emphasized with patient that he needs to mobilize  - FEN: Advance to full liquid diet, decrease TPN to 1/2 rate, plan to discontinue after bag runs out. VTE: LMWH    LOS: 12 days   Michaelle Birks, MD [facility] Surgery General, Hepatobiliary and Pancreatic Surgery 01/15/22 10:12 AM

## 2022-01-15 NOTE — Consult Note (Addendum)
[place] Nurse ostomy follow up  Patient receiving care in [facility] 6N11 Stoma type/location: LLQ colostomy Stomal assessment/size: 2 inches, pink, moist just above skin level with mucocutaneous separation at 3 o'clock that is 1 cm in depth (purulent appearing drainage when this area is pushed). Small bulge over the superior end of the stoma.  Peristomal assessment: sutures intact Treatment options for stomal/peristomal skin: barrier ring Output : thin yellow Ostomy pouching: 2pc. 2 3/4 inch HOP pouch with barrier ring and bedside drainage bag Education provided: None today. Patient confused and continuously ask "what is this thing. Someone has to take this thing away. Why do I have this?" Patient does not seem to comprehend and this nurse is unsure if he will be able to take care of his ostomy without assistance. States he lives with a cousin. Educational materials left in room.  Enrolled patient in [facility] program: Yes  WOC will follow weekly. Re-consult for further needs.   Cathlean Marseilles [name], MSN, RN, [place], Lysle Pearl, [facility] Wound Treatment Associate Pager [phone]

## 2022-01-15 NOTE — Progress Notes (Signed)
Progress Note    Carl Barr   YQM:[id number]  DOB: 12-20-1955  DOA: 01/03/2022     12 PCP: Carl Blackbird, MD  Initial CC: N/V  Hospital Course: Carl Barr is a 66 yo male with PMH lumbar DDD who presented with N/V and inability to pass gas/bowel movements.  CT abdomen/pelvis on admission noted 2 areas of stricture in the colon involving the splenic flexure and sigmoid colon.  Area was concerning for malignancy.  He was evaluated by general surgery.  He underwent left sigmoid and distal transverse colectomy with creation of colostomy on 01/04/2022.  He then developed a postop ileus with ongoing high output.  Interval History:  No events overnight.  Resting in no distress.  Tried discussing ostomy management further and he again did not seem to have a clear plan on how he would manage upon returning home.  Assessment and Plan: * Large bowel obstruction (HCC) - CT abdomen/pelvis on admission noted 2 areas of stricture in the colon involving the splenic flexure and sigmoid colon. -Underwent sigmoid and distal transverse colectomy with creation of colostomy on 01/04/2022 -some improvement in ostomy output in terms of decreased volume.  Surgery is advancing diet further and weaning TPN - PICC line placed on 01/11/2022 -C. difficile negative on 01/13/2022  Open abdominal wall wound - patient had further bleeding from lower abdominal incision - underwent staple removal of lower incision and started on BID dressing changes with saline moistened gauze per surgery   Hypokalemia - Replete as needed  Adenocarcinoma of colon 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 He has very poor health literacy at baseline; with his new colostomy, there is concern for patient being able to handle this at time of  discharge.   - patient needs ongoing ostomy education while inpatient as he's anticipated to go home after rehab unless he transitions into long term care  Malnutrition of moderate degree - Patient's BMI is Body mass index is 27.54 kg/m.. - Patient has the following signs/symptoms consistent with PCM: (Mild fat depletion, moderate muscle depletion) - seen by RD, appreciate assistance. Continue plan per RD to include thiamine added to TPN - Trend electrolytes  Hypophosphatemia - Replete as needed  Hypomagnesemia - Replete as needed   Alcohol use - No further concern for any withdrawal given prolonged hospitalization   Old records reviewed in assessment of this patient  Antimicrobials:   DVT prophylaxis:  enoxaparin (LOVENOX) injection 40 mg Start: 01/07/22 1130 SCDs Start: 01/04/22 0616   Code Status:   Code Status: Full Code  Mobility Assessment (last 72 hours)     Mobility Assessment     Row Name 01/14/22 2117 01/14/22 1000 01/14/22 0800 01/13/22 2000 01/13/22 0727   Does patient have an order for bedrest or is patient medically unstable No - Continue assessment -- No - Continue assessment No - Continue assessment No - Continue assessment   What is the highest level of mobility based on the progressive mobility assessment? Level 3 (Stands with assist) - Balance while standing  and cannot march in place Level 3 (Stands with assist) - Balance while standing  and cannot march in place Level 4 (Walks with assist in room) - Balance while marching in place and cannot step forward and back - Complete Level 2 (Chairfast) -  Balance while sitting on edge of bed and cannot stand Level 2 (Chairfast) - Balance while sitting on edge of bed and cannot stand   Is the above level different from baseline mobility prior to current illness? Yes - Recommend PT order -- Yes - Recommend PT order Yes - Recommend PT order Yes - Recommend PT order    [place] Name 01/12/22 2015           Does patient  have an order for bedrest or is patient medically unstable No - Continue assessment       What is the highest level of mobility based on the progressive mobility assessment? Level 2 (Chairfast) - Balance while sitting on edge of bed and cannot stand       Is the above level different from baseline mobility prior to current illness? Yes - Recommend PT order                Disposition Plan:  SNF Status is: Inpt  Objective: Blood pressure 121/66, pulse 75, temperature 98 F (36.7 C), temperature source Oral, resp. rate 19, height '5\' 9"'$  (1.753 m), weight 94.1 kg, SpO2 99 %.  Examination:  Physical Exam Constitutional:      General: He is not in acute distress.    Appearance: Normal appearance.  HENT:     Head: Normocephalic and atraumatic.     Mouth/Throat:     Mouth: Mucous membranes are moist.  Eyes:     Extraocular Movements: Extraocular movements intact.  Cardiovascular:     Rate and Rhythm: Normal rate and regular rhythm.     Heart sounds: Normal heart sounds.  Pulmonary:     Effort: Pulmonary effort is normal. No respiratory distress.     Breath sounds: Normal breath sounds. No wheezing.  Abdominal:     Comments: Soft, nontender, nondistended.  Bowel sounds improved some.  Ostomy bag in place with liquid stool brown  Musculoskeletal:        General: Normal range of motion.     Cervical back: Normal range of motion and neck supple.  Skin:    General: Skin is warm and dry.  Neurological:     General: No focal deficit present.     Mental Status: He is alert.  Psychiatric:        Mood and Affect: Mood normal.        Behavior: Behavior normal.      Consultants:  General surgery  Procedures:  Left sigmoid and distal transverse colectomy with colostomy creation, 01/04/2022  Data Reviewed: Results for orders placed or performed during the hospital encounter of 01/03/22 (from the past 24 hour(s))  Glucose, capillary     Status: Abnormal   Collection Time: 01/14/22   4:48 PM  Result Value Ref Range   Glucose-Capillary 136 (H) 70 - 99 mg/dL  Glucose, capillary     Status: None   Collection Time: 01/14/22  6:04 PM  Result Value Ref Range   Glucose-Capillary 97 70 - 99 mg/dL   Comment 1 Notify RN    Comment 2 Document in Chart   Glucose, capillary     Status: Abnormal   Collection Time: 01/14/22 11:05 PM  Result Value Ref Range   Glucose-Capillary 115 (H) 70 - 99 mg/dL  Basic metabolic panel     Status: Abnormal   Collection Time: 01/15/22  4:00 AM  Result Value Ref Range   Sodium 134 (L) 135 - 145 mmol/L   Potassium 4.6 3.5 -  5.1 mmol/L   Chloride 104 98 - 111 mmol/L   CO2 25 22 - 32 mmol/L   Glucose, Bld 126 (H) 70 - 99 mg/dL   BUN 11 8 - 23 mg/dL   Creatinine, Ser 0.44 (L) 0.61 - 1.24 mg/dL   Calcium 8.3 (L) 8.9 - 10.3 mg/dL   GFR, Estimated >60 >60 mL/min   Anion gap 5 5 - 15  Phosphorus     Status: None   Collection Time: 01/15/22  4:00 AM  Result Value Ref Range   Phosphorus 3.6 2.5 - 4.6 mg/dL  Glucose, capillary     Status: None   Collection Time: 01/15/22  6:17 AM  Result Value Ref Range   Glucose-Capillary 99 70 - 99 mg/dL    I have Reviewed nursing notes, Vitals, and Lab results since pt's last encounter. Pertinent lab results : see above I have ordered test including BMP, CBC, Mg I have reviewed the last note from staff over past 24 hours I have discussed pt's care plan and test results with nursing staff, case manager   LOS: 12 days   Dwyane Dee, MD [facility] 01/15/2022, 1:08 PM

## 2022-01-15 NOTE — Progress Notes (Signed)
PHARMACY - TOTAL PARENTERAL NUTRITION CONSULT NOTE   Indication: Post-op ileus  Patient Measurements: Height: '5\' 9"'$  (175.3 cm) Weight: 94.1 kg (207 lb 7.3 oz) IBW/kg (Calculated) : 70.7 TPN AdjBW (KG): 81.6 Body mass index is 30.64 kg/m. Usual Weight: patient does not know. Last charted weight was 231 lbs in Jan 2021   Assessment:  66 yo M with colonic obstruction secondary to sigmoid mass s/p open L hemicolectomy with end colostomy 6/30. Patient failed CLD 7/6, had emesis with persistent nausea. NGT is recommended but patient is refusing. Imaging shows post-operative ileus. Patient with no/poor PO intake > 7 days. Patient is requiring very high amounts of K repletion with persistently low K, likely due to high losses in ostomy. Patient is at risk of refeeding. Pharmacy consulted for TPN.   Difficulty tolerating CLD 7/09 (nausea/vomiting episodes following Ensure shakes). Ostomy output remains high. CLD continued 7/10. Appears to have better tolerated diet. No additional reported vomiting.    Glucose / Insulin: BG < 150, no hx DM, not needed sSSI x2 days Electrolytes: K 4.6 (Goal >/= 4), CoCa 10.1, phos 3.6, Mg 2.4, others wnl  Renal: Scr 0.51, BUN wnl Hepatic: LFT/Tbili wnl, albumin 2.0; TG 181 Intake / Output; MIVF: LR @ 41m/hr;  UOP 26086m ostomy output 2200 ml, feculent   GI Imaging: 6/29 CT abd- 2 strictures in colon with bowel obstruction, possible colitis or malignancy 7/3 AbdXR- post-op ileus  7/5 AbdXR- partial SBO or ileus, worse than prior 7/5 CT abd- post-op, likely ileus  GI Surgeries / Procedures:  6/30 open L hemicolectomy with end colostomy  Central access: 7/07 PICC ordered  TPN start date: 7/07  Nutritional Goals: Goal TPN rate is 100 mL/hr (provides 130 g of protein, 362g dextrose, 80g lipids, and 2510 kcals per day)  RD Assessment: pending Estimated Needs Total Energy Estimated Needs: 2000-2300kcal/day Total Protein Estimated Needs: 100-115g/day Total  Fluid Estimated Needs: 1.9-2.2L/day  Current Nutrition:  7/07 TPN 7/10 advancing to CLD  Plan:  Continue TPN at 100 mL/hr at 1800 (meeting ~100% of estimated needs) Electrolytes in TPN: Na 65 mEq/L (limited by TPN volume), decrease K to 70 mEq/L, Ca 5 mEq/L, Mg 10 mEq/L, decrease Phos to 20 mmol/L. Cl:Ac 1:2 Add standard MVI and trace elements to TPN Add folate to TPN to reduce refeeding risk D/c thiamine from TPN with 7/10 bag  (received x3 days) Stop Sensitive q6h SSI, resume if needed  Additional fluids per team (LR @ 5048m/t high ostomy output) Monitor TPN labs on Mon/Thurs   Thank you for allowing pharmacy to be a part of this patient's care.  AdrArdyth HarpsharmD Clinical Pharmacist

## 2022-01-15 NOTE — Progress Notes (Signed)
Mobility Specialist Progress Note:   01/15/22 1215  Mobility  Activity Transferred from chair to bed  Level of Assistance Minimal assist, patient does 75% or more (+2)  Assistive Device Front wheel walker  Distance Ambulated (ft) 2 ft  Activity Response Tolerated well  $Mobility charge 1 Mobility   RN requesting pt transfer back to bed for CT scan. Pt required minA of 2 to stand from recliner. Pt left in bed with all needs met.   Nelta Numbers Acute Rehab Secure Chat or Office Phone: [phone]

## 2022-01-15 NOTE — TOC Initial Note (Signed)
Transition of Care [facility]) - Initial/Assessment Note    Patient Details  Name: Carl Barr MRN: [id number] Date of Birth: 06-08-1956  [facility]) CM/SW Contact:    Tresa Endo Phone Number: 01/15/2022, 10:22 AM  Clinical Narrative:                 CSW received SNF consult. CSW met with pt at bedside. CSW introduced self and explained role at the hospital. Pt reports that PTA the pt lived with his male cousin. PT reports click score as a 13. PT recommends SNF for pt to be able to return to independence CSW reviewed PT/OT recommendations for SNF. Pt asked to go to a SNF bc he has no one at home that will help him. Pt gave CSW permission to fax out to facilities in the area. Pt has no preference of facility at this time. CSW gave pt medicare.gov rating list to review.   CSW will continue to follow.    Expected Discharge Plan: Skilled Nursing Facility Barriers to Discharge: Continued Medical Work up, SNF Pending bed offer   Patient Goals and CMS Choice Patient states their goals for this hospitalization and ongoing recovery are:: Rehab CMS Medicare.gov Compare Post Acute Care list provided to:: Patient Choice offered to / list presented to : Patient  Expected Discharge Plan and Services Expected Discharge Plan: [place] In-house Referral: Clinical Social Work Discharge Planning Services: CM Consult Post Acute Care Choice: [place] arrangements for the past 2 months: [place]                   DME Agency:  (Await PT eval)       HH Arranged: Therapist, sports (Await PT eval) [place] Agency: [place]        Prior Living Arrangements/Services Living arrangements for the past 2 months: Single Family Home Lives with:: Relatives (cousin) Patient language and need for interpreter reviewed:: Yes Do you feel safe going back to the place where you live?: Yes      Need for Family Participation in Patient Care: Yes  (Comment) Care giver support system in place?: Yes (comment)   Criminal Activity/Legal Involvement Pertinent to Current Situation/Hospitalization: No - Comment as needed  Activities of Daily Living Home Assistive Devices/Equipment: None ADL Screening (condition at time of admission) Patient's cognitive ability adequate to safely complete daily activities?: Yes Is the patient deaf or have difficulty hearing?: No Does the patient have difficulty seeing, even when wearing glasses/contacts?: No Does the patient have difficulty concentrating, remembering, or making decisions?: No Patient able to express need for assistance with ADLs?: Yes Does the patient have difficulty dressing or bathing?: No Independently performs ADLs?: Yes (appropriate for developmental age) Does the patient have difficulty walking or climbing stairs?: Yes Weakness of Legs: None Weakness of Arms/Hands: None  Permission Sought/Granted Permission sought to share information with : Facility Art therapist granted to share information with : Yes, Verbal Permission Granted     Permission granted to share info w AGENCY: SNFs        Emotional Assessment Appearance:: Appears stated age Attitude/Demeanor/Rapport: Engaged Affect (typically observed): Accepting Orientation: : Oriented to Self, Oriented to Place, Oriented to  Time, Oriented to Situation Alcohol / Substance Use: Not Applicable Psych Involvement: No (comment)  Admission diagnosis:  SBO (small bowel obstruction) ([place]) [K56.609] Large bowel obstruction ([place]) [K56.609] Patient Active Problem List   Diagnosis Date Noted   Malnutrition of  moderate degree 01/11/2022   Open abdominal wall wound 01/11/2022   Poor social situation 01/10/2022   Hypomagnesemia 01/09/2022   Hypophosphatemia 01/09/2022   Alcohol use 01/04/2022   Hypokalemia 01/04/2022   Large bowel obstruction ([place]) 01/03/2022   Lumbar degenerative disc disease 12/16/2018    Chronic pain of left knee 12/16/2018   Encounter for long-term (current) use of medications 12/16/2018   PCP:  Antony Blackbird, MD Pharmacy:   [facility] [address] [place] [address] Phone: [phone] Fax: [phone]     Social Determinants of Health (SDOH) Interventions    Readmission Risk Interventions     No data to display

## 2022-01-16 ENCOUNTER — Encounter: Payer: Self-pay | Admitting: *Deleted

## 2022-01-16 DIAGNOSIS — K56609 Unspecified intestinal obstruction, unspecified as to partial versus complete obstruction: Secondary | ICD-10-CM | POA: Diagnosis not present

## 2022-01-16 LAB — CBC
HCT: 25.8 % — ABNORMAL LOW (ref 39.0–52.0)
Hemoglobin: 8.4 g/dL — ABNORMAL LOW (ref 13.0–17.0)
MCH: 25.1 pg — ABNORMAL LOW (ref 26.0–34.0)
MCHC: 32.6 g/dL (ref 30.0–36.0)
MCV: 77 fL — ABNORMAL LOW (ref 80.0–100.0)
Platelets: 461 10*3/uL — ABNORMAL HIGH (ref 150–400)
RBC: 3.35 MIL/uL — ABNORMAL LOW (ref 4.22–5.81)
RDW: 18.9 % — ABNORMAL HIGH (ref 11.5–15.5)
WBC: 17.8 10*3/uL — ABNORMAL HIGH (ref 4.0–10.5)
nRBC: 0.2 % (ref 0.0–0.2)

## 2022-01-16 LAB — BASIC METABOLIC PANEL
Anion gap: 8 (ref 5–15)
BUN: 9 mg/dL (ref 8–23)
CO2: 24 mmol/L (ref 22–32)
Calcium: 8.4 mg/dL — ABNORMAL LOW (ref 8.9–10.3)
Chloride: 103 mmol/L (ref 98–111)
Creatinine, Ser: 0.39 mg/dL — ABNORMAL LOW (ref 0.61–1.24)
GFR, Estimated: >60 mL/min (ref >60)
Glucose, Bld: 129 mg/dL — ABNORMAL HIGH (ref 70–99)
Potassium: 4.7 mmol/L (ref 3.5–5.1)
Sodium: 135 mmol/L (ref 135–145)

## 2022-01-16 LAB — PHOSPHORUS: Phosphorus: 3.8 mg/dL (ref 2.5–4.6)

## 2022-01-16 MED ORDER — TRAVASOL 10 % IV SOLN
INTRAVENOUS | Status: AC
  Filled 2022-01-16: qty 648

## 2022-01-16 NOTE — Progress Notes (Signed)
Mobility Specialist Progress Note:   01/16/22 1500  Mobility  Activity Stood at bedside  Level of Assistance Minimal assist, patient does 75% or more  Assistive Device Front wheel walker  Distance Ambulated (ft)  --   Activity Response Tolerated well  $Mobility charge 1 Mobility   Pt requesting to be repositioned in chair. Required minA to stand to readjust pads, pt back in chair with chair alarm on.   Carl Barr Acute Rehab Secure Chat or Office Phone: [phone]

## 2022-01-16 NOTE — Progress Notes (Signed)
Patient ID: Carl Barr, male   DOB: October 04, 1955, 66 y.o.   MRN: [id number]   Acute Care Surgery Service Progress Note:    Chief Complaint/Subjective: Tolerating fulls, no vomiting. Colostomy output 1400.  Objective: Vital signs in last 24 hours: Temp:  [97.6 F (36.4 C)-98.6 F (37 C)] 98.6 F (37 C) (07/12 0827) Pulse Rate:  [76-88] 85 (07/12 0827) Resp:  [17-19] 17 (07/12 0827) BP: (109-117)/(61-84) 115/68 (07/12 0827) SpO2:  [64 %-100 %] 100 % (07/12 0827) Weight:  [95.8 kg] 95.8 kg (07/12 0500) Last BM Date : 01/15/22  Intake/Output from previous day: 07/11 0701 - 07/12 0700 In: 4409.5 [P.O.:720; I.V.:3412.5; IV Piggyback:277] Out: 3350 [Urine:1950; PRFFM:3846] Intake/Output this shift: Total I/O In: 120 [P.O.:120] Out: 750 [Urine:750]  General: WD, obese male who is laying in bed in NAD Lungs: Respiratory effort nonlabored Abd: soft, nondistended; incision clean and dry, open at inferior aspect, fascia in tact. Colostomy is pink and productive of loose stool.  Lab Results: CBC  Recent Labs    01/14/22 0337  WBC 27.7*  HGB 9.9*  HCT 29.5*  PLT 443*   BMET Recent Labs    01/15/22 0400 01/16/22 0336  NA 134* 135  K 4.6 4.7  CL 104 103  CO2 25 24  GLUCOSE 126* 129*  BUN 11 9  CREATININE 0.44* 0.39*  CALCIUM 8.3* 8.4*   LFT    Latest Ref Rng & Units 01/14/2022    3:37 AM 01/09/2022   12:50 AM 01/03/2022    5:14 PM  Hepatic Function  Total Protein 6.5 - 8.1 g/dL 7.2  6.2  8.2   Albumin 3.5 - 5.0 g/dL 2.0  2.0  3.4   AST 15 - 41 U/L _0 ALT 0 - 44 U/L _1 Alk Phosphatase 38 - 126 U/L 47  39  57   Total Bilirubin 0.3 - 1.2 mg/dL 0.5  1.0  1.2    PT/INR No results for input(s): "LABPROT", "INR" in the last 72 hours. ABG No results for input(s): "PHART", "HCO3" in the last 72 hours.  Invalid input(s): "PCO2", "PO2"  Studies/Results:  Anti-infectives: Anti-infectives (From admission, onward)    Start     Dose/Rate Route  Frequency Ordered Stop   01/04/22 0830  cefoTEtan (CEFOTAN) 2 g in sodium chloride 0.9 % 100 mL IVPB        2 g 200 mL/hr over 30 Minutes Intravenous  Once 01/04/22 0805 01/04/22 1010       Medications: Scheduled Meds:  Chlorhexidine Gluconate Cloth  6 each Topical Daily   enoxaparin (LOVENOX) injection  40 mg Subcutaneous Q24H   sodium chloride flush  10-40 mL Intracatheter Q12H   Continuous Infusions:  lactated ringers Stopped (01/16/22 0458)   methocarbamol (ROBAXIN) IV 220 mL/hr at 01/16/22 0525   TPN ADULT (ION) 100 mL/hr at 01/16/22 0525   TPN ADULT (ION)     PRN Meds:.HYDROmorphone (DILAUDID) injection, sodium chloride flush  Assessment/Plan: Patient Active Problem List   Diagnosis Date Noted   Adenocarcinoma of colon ([place]) 01/15/2022   Malnutrition of moderate degree 01/11/2022   Open abdominal wall wound 01/11/2022   Poor social situation 01/10/2022   Hypomagnesemia 01/09/2022   Hypophosphatemia 01/09/2022   Alcohol use 01/04/2022   Hypokalemia 01/04/2022   Large bowel obstruction ([place]) 01/03/2022   Lumbar degenerative disc disease 12/16/2018   Chronic pain of left knee 12/16/2018   Encounter for long-term (current) use  of medications 12/16/2018   s/p Procedure(s): LEFT SIGMOID AND DISTAL TRANSVERSE COLECTOMY COLOSTOMY 01/04/2022  Colonic obstruction secondary to sigmoid mass and thickening of splenic flexure POD#12 S/P open extended left hemicolectomy with end colostomy - Surgical pathology shows a well-differentiated colon adenocarcinoma, stage [id number]. Chest CT shows no evidence of metastatic disease. Patient has been referred to Dr. Benay Spice.  - CT 7/5: with ileus but no abscess or leak noted, mild wall thickening of transverse colon - Large volume stool from ostomy: C diff negative 7/9. Abdomen soft, advance to soft diet. - Emphasized with patient that he needs to mobilize  - FEN: Advance to soft diet. TPN was not weaned yesterday, will go to 1/2 today and  discontinue tomorrow if tolerating solid food. VTE: LMWH    LOS: 13 days   Michaelle Birks, MD [facility] Surgery General, Hepatobiliary and Pancreatic Surgery 01/16/22 10:58 AM

## 2022-01-16 NOTE — Progress Notes (Signed)
Progress Note Patient: Carl Barr DVV:[id number] DOB: [birth year] DOA: 01/03/2022  DOS: the patient was seen and examined on 01/16/2022  Brief hospital course: Carl Barr is a 66 yo male with PMH lumbar DDD who presented with N/V and inability to pass gas/bowel movements.  CT abdomen/pelvis on admission noted 2 areas of stricture in the colon involving the splenic flexure and sigmoid colon.  Area was concerning for malignancy.  He was evaluated by general surgery.  He underwent left sigmoid and distal transverse colectomy with creation of colostomy on 01/04/2022.  He then developed a postop ileus with ongoing high output. Assessment and Plan: * Large bowel obstruction (HCC) - CT abdomen/pelvis on admission noted 2 areas of stricture in the colon involving the splenic flexure and sigmoid colon. -Underwent sigmoid and distal transverse colectomy with creation of colostomy on 01/04/2022 -some improvement in ostomy output in terms of decreased volume.  Surgery is advancing diet further and weaning TPN - PICC line placed on 01/11/2022 -C. 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 Benay Spice in the clinic on 7/19.  Poor social situation - Patient lives at home with his cousin. He has very poor health literacy at baseline; with his new colostomy, there is concern for patient being able to handle this at time of discharge.   - patient needs ongoing ostomy education while inpatient as he's anticipated to go home after rehab unless he transitions into long term care  Malnutrition of  moderate degree - Patient's BMI is Body mass index is 27.54 kg/m.. - Patient has the following signs/symptoms consistent with PCM: (Mild fat depletion, moderate muscle depletion) - seen by RD, appreciate assistance. 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 Anticipating improvement over time but may require medicines like Imodium. Patient did have early ileus postoperatively therefore will be careful before introducing medication like that.  Obesity. Body mass index is 31.19 kg/m.  Placing the pt at higher risk of poor outcomes.  Subjective: Pain well controlled.  No nausea no chills  Physical Exam: Vitals:   01/16/22 0500 01/16/22 0617 01/16/22 0827 01/16/22 1646  BP:  109/66 115/68 116/73  Pulse:  80 85 90  Resp:  '17 17 17  '$ Temp:  98.3 F (36.8 C) 98.6 F (37 C) 98.7 F (37.1 C)  TempSrc:  Oral Oral Oral  SpO2:  97% 100% 100%  Weight: 95.8 kg     Height:       General: Appear in mild distress; no visible Abnormal Neck Mass Or lumps, Conjunctiva normal Cardiovascular: S1 and S2 Present, no Murmur, Respiratory: good respiratory effort, Bilateral Air entry present and CTA, no Crackles, no wheezes Abdomen: Bowel Sound present, Non tender  Extremities: no Pedal edema Neurology: alert and oriented to time, place, and person  Gait not checked due to patient safety concerns  Data Reviewed: I have Reviewed nursing notes, Vitals, and Lab results since pt's last encounter. Pertinent lab results CBC and BMP I have ordered test including CBC and BMP I have reviewed the last note from general surgery,    Family Communication: Family at  bedside  Disposition: Status is: Inpatient Remains inpatient appropriate because: Continues to have high output from his colostomy.  Currently awaiting placement.  Author: Berle Mull, MD 01/16/2022 7:10 PM  Please look on [email or web address] to find out who is on call.

## 2022-01-16 NOTE — Progress Notes (Addendum)
Physical Therapy Treatment Patient Details Name: Carl Barr MRN: [id number] DOB: April 10, 1956 Today's Date: 01/16/2022   History of Present Illness 66 year old male without any past medical history presents to the ER with complaints of constipation with nausea and vomiting.  Found to have bowel obstruction with colonic stricture concerning for possible malignancy.  S/P Left sigmoid and distal transverse colectomy and colostomy creation on 6/30.  7/3, developed ileus with nausea and vomiting.  Continues to have output from the colostomy bag.  Unable to insert NG x2.    PT Comments    Pt willing to participate after encouragement but provided limited effort. Pt reported fatigue after relatively low volume of therapeutic exercises and pt was unwilling to attempt gait with chair following despite being provided extended rest period. Continue as tolerated.  Recommendations for follow up therapy are one component of a multi-disciplinary discharge planning process, led by the attending physician.  Recommendations may be updated based on patient status, additional functional criteria and insurance authorization.  Follow Up Recommendations  Skilled nursing-short term rehab (<3 hours/day) Can patient physically be transported by private vehicle: Yes   Assistance Recommended at Discharge Intermittent Supervision/Assistance  Patient can return home with the following A little help with bathing/dressing/bathroom;Assistance with cooking/housework;Direct supervision/assist for medications management;Assist for transportation;Help with stairs or ramp for entrance;A lot of help with walking and/or transfers   Equipment Recommendations  Rolling walker (2 wheels);BSC/3in1    Recommendations for Other Services       Precautions / Restrictions Precautions Precautions: Fall Precaution Comments: colostomy Restrictions Weight Bearing Restrictions: No     Mobility  Bed Mobility                General bed mobility comments: Pt up in chair.    Transfers Overall transfer level: Needs assistance Equipment used: Rolling walker (2 wheels) Transfers: Sit to/from Stand Sit to Stand: Min assist           General transfer comment: Pt up in chair and did not want to get back to bed. Pt stood to RW with min assist before stating he had urge to urinate. Pt back to chair thereafter. Pt declined performing steps with chair following.    Ambulation/Gait                   Stairs             Wheelchair Mobility    Modified Rankin (Stroke Patients Only)       Balance Overall balance assessment: Needs assistance Sitting-balance support: No upper extremity supported, Feet supported Sitting balance-Leahy Scale: Fair     Standing balance support: Bilateral upper extremity supported Standing balance-Leahy Scale: Poor Standing balance comment: relies on UE support                            Cognition Arousal/Alertness: Awake/alert Behavior During Therapy: Flat affect Overall Cognitive Status: Impaired/Different from baseline Area of Impairment: Following commands, Safety/judgement, Problem solving                       Following Commands: Follows one step commands with increased time Safety/Judgement: Decreased awareness of safety, Decreased awareness of deficits   Problem Solving: Difficulty sequencing, Requires verbal cues, Decreased initiation, Slow processing General Comments: Pt with difficulty answering questions directly and slow processing present.        Exercises General Exercises - Lower Extremity Ankle Circles/Pumps: AROM, Strengthening, Both, 10  reps, Supine Quad Sets: Both, 10 reps, Supine Heel Slides: AAROM, Strengthening, Both, Supine, 5 reps (limited 2/2 bilateral knee pain) Hip ABduction/ADduction: Both, 10 reps, Strengthening, Supine (pillow squeeze)    General Comments        Pertinent Vitals/Pain Pain  Assessment Pain Assessment: 0-10 Pain Score:  (pt would not quantify) Pain Location: stomach Pain Descriptors / Indicators: Discomfort, Guarding Pain Intervention(s): Limited activity within patient's tolerance, Monitored during session    Home Living                          Prior Function            PT Goals (current goals can now be found in the care plan section) Acute Rehab PT Goals Patient Stated Goal: to go home PT Goal Formulation: With patient Time For Goal Achievement: 01/22/22 Potential to Achieve Goals: Good Progress towards PT goals: Progressing toward goals (slow progress)    Frequency    Min 3X/week      PT Plan Current plan remains appropriate    Co-evaluation              AM-PAC PT "6 Clicks" Mobility   Outcome Measure  Help needed turning from your back to your side while in a flat bed without using bedrails?: A Little Help needed moving from lying on your back to sitting on the side of a flat bed without using bedrails?: A Little Help needed moving to and from a bed to a chair (including a wheelchair)?: A Lot Help needed standing up from a chair using your arms (e.g., wheelchair or bedside chair)?: A Little Help needed to walk in hospital room?: A Lot Help needed climbing 3-5 steps with a railing? : Total 6 Click Score: 14    End of Session Equipment Utilized During Treatment: Gait belt Activity Tolerance: Other (comment) (pt with decreased effort) Patient left: with call bell/phone within reach;in chair;with chair alarm set;with family/visitor present Nurse Communication: Mobility status PT Visit Diagnosis: Unsteadiness on feet (R26.81);Muscle weakness (generalized) (M62.81)     Time: 0947-0962 PT Time Calculation (min) (ACUTE ONLY): 18 min  Charges:  $Therapeutic Exercise: 8-22 mins                     Donna Bernard, PT    [facility] 01/16/2022, 2:29 PM

## 2022-01-16 NOTE — Progress Notes (Addendum)
PHARMACY - TOTAL PARENTERAL NUTRITION CONSULT NOTE   Indication: Post-op ileus  Patient Measurements: Height: '5\' 9"'$  (175.3 cm) Weight: 95.8 kg (211 lb 3.2 oz) IBW/kg (Calculated) : 70.7 TPN AdjBW (KG): 81.6 Body mass index is 31.19 kg/m. Usual Weight: patient does not know. Last charted weight was 231 lbs in Jan 2021   Assessment:  66 yo M with colonic obstruction secondary to sigmoid mass s/p open L hemicolectomy with end colostomy 6/30. Patient failed CLD 7/6, had emesis with persistent nausea. NGT is recommended but patient is refusing. Imaging shows post-operative ileus. Patient with no/poor PO intake > 7 days. Patient is requiring very high amounts of K repletion with persistently low K, likely due to high losses in ostomy. Patient is at risk of refeeding. Pharmacy consulted for TPN.   Difficulty tolerating CLD 7/09 (nausea/vomiting episodes following Ensure shakes). Ostomy output remains high. CLD continued 7/10. Appears to have better tolerated diet. No additional reported vomiting.  Now tolerating FLD with no additional emesis reported.  Glucose / Insulin: BG < 150, no hx DM, not needed sSSI x2 days Electrolytes: K 4.7 (Goal >/= 4), CoCa 10.1, Phos 3.8, Mg 2.4, others wnl  Renal: Scr 0.39, BUN wnl Hepatic: LFT/Tbili wnl, albumin 2.0; TG 181 Intake / Output; MIVF: LR @ 83m/hr;  UOP 1600 mL, ostomy output 1350 mL   GI Imaging: 6/29 CT abd- 2 strictures in colon with bowel obstruction, possible colitis or malignancy 7/3 AbdXR- post-op ileus  7/5 AbdXR- partial SBO or ileus, worse than prior 7/5 CT abd- post-op, likely ileus  GI Surgeries / Procedures:  6/30 open L hemicolectomy with end colostomy  Central access: 7/07 PICC ordered  TPN start date: 7/07  Nutritional Goals: Goal TPN rate is 100 mL/hr (provides 130 g of protein, 362g dextrose, 80g lipids, and 2510 kcals per day)  RD Assessment: pending Estimated Needs Total Energy Estimated Needs: 2000-2300kcal/day Total  Protein Estimated Needs: 100-115g/day Total Fluid Estimated Needs: 1.9-2.2L/day  Current Nutrition:  7/07 TPN 7/10 advancing to CLD 7/11 advancing to FLD  Plan:  Decrease TPN to 50 mL/hr at 1800 (meeting ~50% of estimated needs) Electrolytes in TPN: Na 65 mEq/L (limited by TPN volume), K 70 mEq/L, Ca 5 mEq/L, Mg 10 mEq/L, Phos 20 mmol/L. Cl:Ac 1:2 Add standard MVI and trace elements to TPN Remove folate from TPN (thiamine completed x3 days) Stop Sensitive q6h SSI, resume if needed  Additional fluids per team (LR @ 567md/t high ostomy output) Planning to DC TPN 7/13 Monitor TPN labs on Mon/Thurs   Thank you for allowing pharmacy to be a part of this patient's care.  AdArdyth HarpsPharmD Clinical Pharmacist

## 2022-01-16 NOTE — Progress Notes (Signed)
PATIENT NAVIGATOR PROGRESS NOTE  Name: Carl Barr Date: 01/16/2022 MRN: [id number]  DOB: 21-Apr-1956   Reason for visit:  Introductory phone call  Comments:  Left message for patient regarding New Patient appt with Dr Benay Spice next week on 01/23/22 at 1:40 pm.  Gave contact information to call with any issues or questions. Patient D/C plan includes SNF, will continue to monitor in order to facilitate outpatient care.    Time spent counseling/coordinating care: 30-45 minutes

## 2022-01-16 NOTE — Plan of Care (Signed)
  Problem: Education: Goal: Knowledge of General Education information will improve Description: Including pain rating scale, medication(s)/side effects and non-pharmacologic comfort measures Outcome: Progressing   Problem: Health Behavior/Discharge Planning: Goal: Ability to manage health-related needs will improve Outcome: Progressing   Problem: Clinical Measurements: Goal: Will remain free from infection Outcome: Progressing   

## 2022-01-16 NOTE — Progress Notes (Signed)
Mobility Specialist Progress Note:   01/16/22 1400  Mobility  Activity Transferred from bed to chair  Level of Assistance Minimal assist, patient does 75% or more  Assistive Device Front wheel walker  Distance Ambulated (ft) 2 ft  Activity Response Tolerated well  $Mobility charge 1 Mobility   Pt agreeable to transfer to chair. Required minA to stand form bed, and take pivotal steps to chair. Pt left in chair with all needs met.   Nelta Numbers Acute Rehab Secure Chat or Office Phone: [phone]

## 2022-01-17 ENCOUNTER — Encounter (HOSPITAL_COMMUNITY): Payer: Self-pay | Admitting: Family Medicine

## 2022-01-17 DIAGNOSIS — K56609 Unspecified intestinal obstruction, unspecified as to partial versus complete obstruction: Secondary | ICD-10-CM | POA: Diagnosis not present

## 2022-01-17 LAB — CBC
HCT: 26.2 % — ABNORMAL LOW (ref 39.0–52.0)
Hemoglobin: 8.6 g/dL — ABNORMAL LOW (ref 13.0–17.0)
MCH: 25.4 pg — ABNORMAL LOW (ref 26.0–34.0)
MCHC: 32.8 g/dL (ref 30.0–36.0)
MCV: 77.3 fL — ABNORMAL LOW (ref 80.0–100.0)
Platelets: 476 10*3/uL — ABNORMAL HIGH (ref 150–400)
RBC: 3.39 MIL/uL — ABNORMAL LOW (ref 4.22–5.81)
RDW: 18.6 % — ABNORMAL HIGH (ref 11.5–15.5)
WBC: 16.1 10*3/uL — ABNORMAL HIGH (ref 4.0–10.5)
nRBC: 0.4 % — ABNORMAL HIGH (ref 0.0–0.2)

## 2022-01-17 LAB — COMPREHENSIVE METABOLIC PANEL
ALT: 66 U/L — ABNORMAL HIGH (ref 0–44)
AST: 55 U/L — ABNORMAL HIGH (ref 15–41)
Albumin: 2 g/dL — ABNORMAL LOW (ref 3.5–5.0)
Alkaline Phosphatase: 62 U/L (ref 38–126)
Anion gap: 7 (ref 5–15)
BUN: 7 mg/dL — ABNORMAL LOW (ref 8–23)
CO2: 24 mmol/L (ref 22–32)
Calcium: 8.6 mg/dL — ABNORMAL LOW (ref 8.9–10.3)
Chloride: 102 mmol/L (ref 98–111)
Creatinine, Ser: 0.49 mg/dL — ABNORMAL LOW (ref 0.61–1.24)
GFR, Estimated: >60 mL/min (ref >60)
Glucose, Bld: 118 mg/dL — ABNORMAL HIGH (ref 70–99)
Potassium: 4.5 mmol/L (ref 3.5–5.1)
Sodium: 133 mmol/L — ABNORMAL LOW (ref 135–145)
Total Bilirubin: 0.4 mg/dL (ref 0.3–1.2)
Total Protein: 7.2 g/dL (ref 6.5–8.1)

## 2022-01-17 LAB — MAGNESIUM: Magnesium: 2 mg/dL (ref 1.7–2.4)

## 2022-01-17 LAB — SURGICAL PATHOLOGY

## 2022-01-17 LAB — PHOSPHORUS: Phosphorus: 4.2 mg/dL (ref 2.5–4.6)

## 2022-01-17 LAB — TRIGLYCERIDES: Triglycerides: 122 mg/dL (ref <150)

## 2022-01-17 MED ORDER — THIAMINE HCL 100 MG PO TABS
100.0000 mg | ORAL_TABLET | Freq: Every day | ORAL | Status: DC
  Administered 2022-01-18 – 2022-01-28 (×11): 100 mg via ORAL
  Filled 2022-01-17 (×12): qty 1

## 2022-01-17 MED ORDER — FOLIC ACID 1 MG PO TABS
1.0000 mg | ORAL_TABLET | Freq: Every day | ORAL | Status: DC
  Administered 2022-01-18 – 2022-01-28 (×11): 1 mg via ORAL
  Filled 2022-01-17 (×11): qty 1

## 2022-01-17 MED ORDER — ENSURE ENLIVE PO LIQD
237.0000 mL | Freq: Two times a day (BID) | ORAL | Status: DC
  Administered 2022-01-18 – 2022-01-28 (×19): 237 mL via ORAL

## 2022-01-17 MED ORDER — ADULT MULTIVITAMIN W/MINERALS CH
1.0000 | ORAL_TABLET | Freq: Every day | ORAL | Status: DC
  Administered 2022-01-18 – 2022-01-28 (×11): 1 via ORAL
  Filled 2022-01-17 (×11): qty 1

## 2022-01-17 NOTE — Consult Note (Signed)
[facility]  [phone] [phone]   MEDICAL ONCOLOGY - INITIAL CONSULTATION  Referral MD: Dr. 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  He presented to the emergency department due to difficulty moving bowels x5 days.  Prior to admission, he developed some nausea and poor appetite.  Due to constipation, he took laxatives without relief.  He has never had a previous colonoscopy or routine medical follow-up.  On admission, his WBC and hemoglobin were normal.  However, his MCV was low at 77.  Platelets were mildly elevated at 420,000.  Sodium was 134, potassium 2.9, albumin 3.4.  CT abdomen/pelvis with contrast was performed which showed 2 areas of strictures in the colon-the first is at the splenic flexure with circumferential wall thickening over several centimeters and mucosal enhancement which could reflect colitis versus malignancy and the second colonic stricture was seen at the sigmoid colon and area concerning for malignancy.  CT showed bowel obstruction related to these 2 colonic strictures.  CEA was obtained on 01/04/2022 and was mildly elevated at 5.5.  He was taken to the OR by general surgery on 01/04/2022 for a left, sigmoid, and distal transverse colectomy, colostomy.  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  CT chest without contrast was performed on 01/15/2022 which showed no worrisome pulmonary nodules to suggest pulmonary metastatic disease, no mediastinal or hilar mass or adenopathy, scattered low-attenuation liver lesions, likely benign cysts.  He has had large volume stool from his ostomy which is slowly improving.  Remains on TPN.  Prior to admission, he states that he was having abdominal discomfort.  Appetite was decreased.  He is not sure if he was losing  weight.  He had some mild nausea without vomiting.  No recent melena or hematochezia.  He denies headaches, dizziness, chest pain, shortness of breath.  The patient is not married.  He has no children.  States that he was living with his cousin and thinks that he may go to a facility following hospital discharge.  The patient denies prior tobacco use.  States that he previously drank alcohol but does not quantify how much he drank.  Cannot tell me when he quit.  States that he had an aunt who had cancer.  He is not sure what type or what side of the family this aunt is on. Medical oncology was asked see the patient make recommendations regarding his newly diagnosed colon adenocarcinoma.  Past Medical History:  Diagnosis Date   Alcohol intoxication ([place])    Lumbar degenerative disc disease    Lumbar radiculopathy, right   :  Past Surgical History:  Procedure Laterality Date   COLOSTOMY N/A 01/04/2022   Procedure: COLOSTOMY;  Surgeon: Georganna Skeans, MD;  Location: [facility];  Service: General;  Laterality: N/A;   denies past surgery     PARTIAL COLECTOMY N/A 01/04/2022   Procedure: LEFT SIGMOID AND DISTAL TRANSVERSE COLECTOMY;  Surgeon: Georganna Skeans, MD;  Location: [facility];  Service: General;  Laterality: N/A;  :   Current Facility-Administered Medications  Medication Dose Route Frequency Provider Last Rate Last Admin   Chlorhexidine Gluconate Cloth 2 % PADS 6 each  6 each Topical Daily Lavina Hamman, MD   6 each at 01/17/22 1027   enoxaparin (LOVENOX) injection 40 mg  40 mg Subcutaneous Q24H Lavina Hamman, MD  40 mg at 01/17/22 1027   HYDROmorphone (DILAUDID) injection 0.5 mg  0.5 mg Intravenous Q3H PRN Lavina Hamman, MD   0.5 mg at 01/13/22 2000   lactated ringers infusion   Intravenous Continuous Donnamae Jude, RPH 50 mL/hr at 01/16/22 2203 New Bag at 01/16/22 2203   methocarbamol (ROBAXIN) 1,000 mg in dextrose 5 % 100 mL IVPB  1,000 mg Intravenous Q8H Jesusita Oka, MD 220 mL/hr at  01/17/22 0535 1,000 mg at 01/17/22 0535   sodium chloride flush (NS) 0.9 % injection 10-40 mL  10-40 mL Intracatheter PRN Dwyane Dee, MD       sodium chloride flush (NS) 0.9 % injection 10-40 mL  10-40 mL Intracatheter Q12H Dwyane Dee, MD   10 mL at 01/17/22 1027   TPN ADULT (ION)   Intravenous Continuous TPN Lavina Hamman, MD 50 mL/hr at 01/16/22 1837 Infusion Verify at 01/16/22 1837   No Known Allergies:   Family History  Problem Relation Age of Onset   Diabetes Neg Hx    Hypertension Neg Hx   :  Social History   Socioeconomic History   Marital status: Single    Spouse name: Not on file   Number of children: Not on file   Years of education: Not on file   Highest education level: Not on file  Occupational History   Not on file  Tobacco Use   Smoking status: Former   Smokeless tobacco: Never   Tobacco comments:    Patient DENIES 07/10/2018  Vaping Use   Vaping Use: Never used  Substance and Sexual Activity   Alcohol use: Yes    Comment: "alot"- patient DENIES 07/10/2018   Drug use: Not Currently   Sexual activity: Not Currently  Other Topics Concern   Not on file  Social History Narrative   Not on file   Social Determinants of Health   Financial Resource Strain: Not on file  Food Insecurity: Not on file  Transportation Needs: Not on file  Physical Activity: Not on file  Stress: Not on file  Social Connections: Not on file  Intimate Partner Violence: Not on file  :  Review of Systems: A comprehensive 14 point review of systems was negative except as noted in the HPI.  Exam: Patient Vitals for the past 24 hrs:  BP Temp Temp src Pulse Resp SpO2 Weight  01/17/22 0844 119/72 98 F (36.7 C) Oral 85 17 99 % --  01/17/22 0427 -- -- -- -- -- -- 98.4 kg  01/17/22 0426 (!) 114/58 98.6 F (37 C) Oral 89 18 99 % --  01/16/22 2046 129/68 -- -- 93 17 99 % --  01/16/22 1646 116/73 98.7 F (37.1 C) Oral 90 17 100 % --    General: Sitting up in bed, no  distress. Eyes:  no scleral icterus.   ENT:  There were no oropharyngeal lesions.   Lymphatics:  Negative cervical, supraclavicular or axillary adenopathy.   Respiratory: lungs were clear bilaterally without wheezing or crackles.   Cardiovascular:  Regular rate and rhythm, S1/S2, without murmur, rub or gallop.  There was no pedal edema.   GI: Positive bowel sounds, abdomen mildly distended, midline incision with staples without any erythema or drainage, colostomy in the left lower quadrant with a large amount of liquid brown stool Skin exam was without echymosis, petichae.   Neuro exam was nonfocal. Patient was alert and oriented.  Attention was good.   Language was appropriate.  Mood was  normal without depression.  Speech was not pressured.  Thought content was not tangential.     Lab Results  Component Value Date   WBC 16.1 (H) 01/17/2022   HGB 8.6 (L) 01/17/2022   HCT 26.2 (L) 01/17/2022   PLT 476 (H) 01/17/2022   GLUCOSE 118 (H) 01/17/2022   TRIG 122 01/17/2022   ALT 66 (H) 01/17/2022   AST 55 (H) 01/17/2022   NA 133 (L) 01/17/2022   K 4.5 01/17/2022   CL 102 01/17/2022   CREATININE 0.49 (L) 01/17/2022   BUN 7 (L) 01/17/2022   CO2 24 01/17/2022    CT CHEST WO CONTRAST  Result Date: 01/15/2022 CLINICAL DATA:  Metastatic colon cancer. EXAM: CT CHEST WITHOUT CONTRAST TECHNIQUE: Multidetector CT imaging of the chest was performed following the standard protocol without IV contrast. RADIATION DOSE REDUCTION: This exam was performed according to the departmental dose-optimization program which includes automated exposure control, adjustment of the mA and/or kV according to patient size and/or use of iterative reconstruction technique. COMPARISON:  None Available. FINDINGS: Cardiovascular: The heart is mildly enlarged mainly due to left ventricular enlargement. No pericardial effusion. The aorta is normal in caliber. No atherosclerotic calcifications. No definite coronary artery  calcifications. Mediastinum/Nodes: No mediastinal or hilar mass or lymphadenopathy. The esophagus is grossly normal. Lungs/Pleura: Streaky areas of subsegmental atelectasis. No infiltrates or effusions. No worrisome pulmonary nodules to suggest pulmonary metastatic disease. 4 mm perifissural nodule in the right middle lobe adjacent to the minor fissure is most consistent with a benign lymph node. Upper Abdomen: Scattered low-attenuation liver lesions, likely benign cysts. No upper abdominal adenopathy. Musculoskeletal: No chest wall mass. Scattered axillary nodes are likely benign/reactive. The thyroid gland is unremarkable. A right PICC line is in place. No worrisome bone lesions. IMPRESSION: 1. No worrisome pulmonary nodules to suggest pulmonary metastatic disease. 2. No mediastinal or hilar mass or adenopathy. 3. Scattered low-attenuation liver lesions, likely benign cysts. 4. Mild cardiac enlargement mainly due to left ventricular enlargement. Electronically Signed   By: Marijo Sanes M.D.   On: 01/15/2022 13:41   [place] EKG SITE RITE  Result Date: 01/11/2022 If Site Rite image not attached, placement could not be confirmed due to current cardiac rhythm.  CT ABDOMEN PELVIS W CONTRAST  Result Date: 01/09/2022 CLINICAL DATA:  Postop abdominal pain EXAM: CT ABDOMEN AND PELVIS WITH CONTRAST TECHNIQUE: Multidetector CT imaging of the abdomen and pelvis was performed using the standard protocol following bolus administration of intravenous contrast. RADIATION DOSE REDUCTION: This exam was performed according to the departmental dose-optimization program which includes automated exposure control, adjustment of the mA and/or kV according to patient size and/or use of iterative reconstruction technique. CONTRAST:  156mL OMNIPAQUE IOHEXOL 300 MG/ML  SOLN COMPARISON:  CT abdomen and pelvis dated January 03, 2022 FINDINGS: Lower chest: Small left pleural effusion and atelectasis. Hepatobiliary: Scattered low-attenuation  liver lesions, too small to completely characterize, but likely simple cysts. Gallbladder is unremarkable. No biliary ductal dilation. Pancreas: Unremarkable. No pancreatic ductal dilatation or surrounding inflammatory changes. Spleen: Normal in size without focal abnormality. Adrenals/Urinary Tract: Adrenal glands are unremarkable. Kidneys are normal, without renal calculi, focal lesion, or hydronephrosis. Bladder is unremarkable. Stomach/Bowel: Interval sigmoidectomy with left upper quadrant ostomy. Multiple dilated loops of small bowel are seen. Mild wall thickening of the proximal transverse colon. Vascular/Lymphatic: No significant vascular findings are present. No enlarged abdominal or pelvic lymph nodes. Reproductive: Mild prostatomegaly. Other: Postsurgical changes of the anterior abdominal wall. Small locules of free intraperitoneal  air and trace fluid in the left hemiabdomen, likely postsurgical. Musculoskeletal: Diffuse flowing anterior osteophytes, findings can be seen in the setting of diffuse idiopathic skeletal hyperostosis. No acute or significant osseous findings. IMPRESSION: 1. Interval sigmoidectomy with left upper quadrant ostomy. Small locules of free intraperitoneal air and trace fluid in the left hemiabdomen, likely postsurgical. 2. Multiple dilated loops of small bowel, likely due to ileus. 3. Mild wall thickening of the proximal transverse colon, likely reactive. 4. Small left pleural effusion. Electronically Signed   By: Yetta Glassman M.D.   On: 01/09/2022 17:10   DG Abd Portable 1V  Result Date: 01/09/2022 CLINICAL DATA:  Ileus EXAM: PORTABLE ABDOMEN - 1 VIEW COMPARISON:  January 07, 2022 FINDINGS: Multiple air-filled dilated small bowel loops are identified in the abdomen and pelvis. There is air identified in the rectal sigmoid colon. Midline surgical staples are identified. Degenerative joint changes of the spine are noted. IMPRESSION: Findings consistent with partial small bowel  obstruction or ileus. This is worsened compared prior exam. Electronically Signed   By: Abelardo Diesel M.D.   On: 01/09/2022 08:12   DG Abd 1 View  Result Date: 01/07/2022 CLINICAL DATA:  Encounter for NG tube EXAM: ABDOMEN - 1 VIEW COMPARISON:  CT 01/03/2022. FINDINGS: Nasogastric tube is coiled in the mid chest with tip coursing cephalad outside of the field of view. There is gaseous distention of bowel in the upper abdomen. Midline surgical staples noted. Low lung volumes with elevated right hemidiaphragm and right basilar atelectasis. IMPRESSION: Nasogastric tube coils in the mid chest with tip coursing cephalad outside of the field of view. Recommend repositioning/replacement. Gaseous distension of bowel in the upper abdomen compatible with postoperative ileus. Recommend continued radiographic follow-up. These results will be called to the ordering clinician or representative by the Radiologist Assistant, and communication documented in the PACS or [employer]. Electronically Signed   By: Maurine Simmering M.D.   On: 01/07/2022 13:02   CT Abdomen Pelvis W Contrast  Result Date: 01/03/2022 CLINICAL DATA:  Bowel obstruction suspected EXAM: CT ABDOMEN AND PELVIS WITH CONTRAST TECHNIQUE: Multidetector CT imaging of the abdomen and pelvis was performed using the standard protocol following bolus administration of intravenous contrast. RADIATION DOSE REDUCTION: This exam was performed according to the departmental dose-optimization program which includes automated exposure control, adjustment of the mA and/or kV according to patient size and/or use of iterative reconstruction technique. CONTRAST:  58mL OMNIPAQUE IOHEXOL 350 MG/ML SOLN COMPARISON:  None Available. FINDINGS: Lower chest: Bibasilar linear opacities, likely atelectasis. Hepatobiliary: Scattered subcentimeter hypodensities in the liver, likely small cysts. Gallbladder unremarkable. Pancreas: No focal abnormality or ductal dilatation. Spleen: No focal  abnormality.  Normal size. Adrenals/Urinary Tract: No adrenal abnormality. No focal renal abnormality. No stones or hydronephrosis. Urinary bladder is unremarkable. Stomach/Bowel: Markedly dilated small bowel diffusely. Right colon and transverse colon are dilated to the distal transverse colon/splenic flexure where there is circumferential wall thickening and mucosal enhancement. Favor colitis although cancer cannot be excluded. After this area at the splenic flexure, the distal transverse colon and sigmoid colon again become dilated. There is abrupt decompression of the sigmoid colon best seen on sagittal image 82 and axial image 80. Remainder the rectosigmoid colon are decompressed. This areas concerning for possible tumor/malignancy. Vascular/Lymphatic: No evidence of aneurysm or adenopathy. Reproductive: No visible focal abnormality. Other: No free fluid or free air. Musculoskeletal: No acute bony abnormality. IMPRESSION: 2 areas of strictures in the colon. The 1st is at the splenic flexure with circumferential  wall thickening over several cm and mucosal enhancement. This could reflect an area of colitis or malignancy. Second colonic stricture is seen in the sigmoid colon. This area is concerning for malignancy. Bowel obstruction related to these 2 colonic strictures. Small bowel is diffusely dilated, fluid-filled with air-fluid levels. Right colon and transverse colon are also dilated. Bibasilar atelectasis. Electronically Signed   By: Rolm Baptise M.D.   On: 01/03/2022 22:08     CT CHEST WO CONTRAST  Result Date: 01/15/2022 CLINICAL DATA:  Metastatic colon cancer. EXAM: CT CHEST WITHOUT CONTRAST TECHNIQUE: Multidetector CT imaging of the chest was performed following the standard protocol without IV contrast. RADIATION DOSE REDUCTION: This exam was performed according to the departmental dose-optimization program which includes automated exposure control, adjustment of the mA and/or kV according to  patient size and/or use of iterative reconstruction technique. COMPARISON:  None Available. FINDINGS: Cardiovascular: The heart is mildly enlarged mainly due to left ventricular enlargement. No pericardial effusion. The aorta is normal in caliber. No atherosclerotic calcifications. No definite coronary artery calcifications. Mediastinum/Nodes: No mediastinal or hilar mass or lymphadenopathy. The esophagus is grossly normal. Lungs/Pleura: Streaky areas of subsegmental atelectasis. No infiltrates or effusions. No worrisome pulmonary nodules to suggest pulmonary metastatic disease. 4 mm perifissural nodule in the right middle lobe adjacent to the minor fissure is most consistent with a benign lymph node. Upper Abdomen: Scattered low-attenuation liver lesions, likely benign cysts. No upper abdominal adenopathy. Musculoskeletal: No chest wall mass. Scattered axillary nodes are likely benign/reactive. The thyroid gland is unremarkable. A right PICC line is in place. No worrisome bone lesions. IMPRESSION: 1. No worrisome pulmonary nodules to suggest pulmonary metastatic disease. 2. No mediastinal or hilar mass or adenopathy. 3. Scattered low-attenuation liver lesions, likely benign cysts. 4. Mild cardiac enlargement mainly due to left ventricular enlargement. Electronically Signed   By: Marijo Sanes M.D.   On: 01/15/2022 13:41   [place] EKG SITE RITE  Result Date: 01/11/2022 If Site Rite image not attached, placement could not be confirmed due to current cardiac rhythm.  CT ABDOMEN PELVIS W CONTRAST  Result Date: 01/09/2022 CLINICAL DATA:  Postop abdominal pain EXAM: CT ABDOMEN AND PELVIS WITH CONTRAST TECHNIQUE: Multidetector CT imaging of the abdomen and pelvis was performed using the standard protocol following bolus administration of intravenous contrast. RADIATION DOSE REDUCTION: This exam was performed according to the departmental dose-optimization program which includes automated exposure control, adjustment of  the mA and/or kV according to patient size and/or use of iterative reconstruction technique. CONTRAST:  132mL OMNIPAQUE IOHEXOL 300 MG/ML  SOLN COMPARISON:  CT abdomen and pelvis dated January 03, 2022 FINDINGS: Lower chest: Small left pleural effusion and atelectasis. Hepatobiliary: Scattered low-attenuation liver lesions, too small to completely characterize, but likely simple cysts. Gallbladder is unremarkable. No biliary ductal dilation. Pancreas: Unremarkable. No pancreatic ductal dilatation or surrounding inflammatory changes. Spleen: Normal in size without focal abnormality. Adrenals/Urinary Tract: Adrenal glands are unremarkable. Kidneys are normal, without renal calculi, focal lesion, or hydronephrosis. Bladder is unremarkable. Stomach/Bowel: Interval sigmoidectomy with left upper quadrant ostomy. Multiple dilated loops of small bowel are seen. Mild wall thickening of the proximal transverse colon. Vascular/Lymphatic: No significant vascular findings are present. No enlarged abdominal or pelvic lymph nodes. Reproductive: Mild prostatomegaly. Other: Postsurgical changes of the anterior abdominal wall. Small locules of free intraperitoneal air and trace fluid in the left hemiabdomen, likely postsurgical. Musculoskeletal: Diffuse flowing anterior osteophytes, findings can be seen in the setting of diffuse idiopathic skeletal hyperostosis. No acute or  significant osseous findings. IMPRESSION: 1. Interval sigmoidectomy with left upper quadrant ostomy. Small locules of free intraperitoneal air and trace fluid in the left hemiabdomen, likely postsurgical. 2. Multiple dilated loops of small bowel, likely due to ileus. 3. Mild wall thickening of the proximal transverse colon, likely reactive. 4. Small left pleural effusion. Electronically Signed   By: Yetta Glassman M.D.   On: 01/09/2022 17:10   DG Abd Portable 1V  Result Date: 01/09/2022 CLINICAL DATA:  Ileus EXAM: PORTABLE ABDOMEN - 1 VIEW COMPARISON:  January 07, 2022 FINDINGS: Multiple air-filled dilated small bowel loops are identified in the abdomen and pelvis. There is air identified in the rectal sigmoid colon. Midline surgical staples are identified. Degenerative joint changes of the spine are noted. IMPRESSION: Findings consistent with partial small bowel obstruction or ileus. This is worsened compared prior exam. Electronically Signed   By: Abelardo Diesel M.D.   On: 01/09/2022 08:12   DG Abd 1 View  Result Date: 01/07/2022 CLINICAL DATA:  Encounter for NG tube EXAM: ABDOMEN - 1 VIEW COMPARISON:  CT 01/03/2022. FINDINGS: Nasogastric tube is coiled in the mid chest with tip coursing cephalad outside of the field of view. There is gaseous distention of bowel in the upper abdomen. Midline surgical staples noted. Low lung volumes with elevated right hemidiaphragm and right basilar atelectasis. IMPRESSION: Nasogastric tube coils in the mid chest with tip coursing cephalad outside of the field of view. Recommend repositioning/replacement. Gaseous distension of bowel in the upper abdomen compatible with postoperative ileus. Recommend continued radiographic follow-up. These results will be called to the ordering clinician or representative by the Radiologist Assistant, and communication documented in the PACS or [employer]. Electronically Signed   By: Maurine Simmering M.D.   On: 01/07/2022 13:02   CT Abdomen Pelvis W Contrast  Result Date: 01/03/2022 CLINICAL DATA:  Bowel obstruction suspected EXAM: CT ABDOMEN AND PELVIS WITH CONTRAST TECHNIQUE: Multidetector CT imaging of the abdomen and pelvis was performed using the standard protocol following bolus administration of intravenous contrast. RADIATION DOSE REDUCTION: This exam was performed according to the departmental dose-optimization program which includes automated exposure control, adjustment of the mA and/or kV according to patient size and/or use of iterative reconstruction technique. CONTRAST:  30mL OMNIPAQUE  IOHEXOL 350 MG/ML SOLN COMPARISON:  None Available. FINDINGS: Lower chest: Bibasilar linear opacities, likely atelectasis. Hepatobiliary: Scattered subcentimeter hypodensities in the liver, likely small cysts. Gallbladder unremarkable. Pancreas: No focal abnormality or ductal dilatation. Spleen: No focal abnormality.  Normal size. Adrenals/Urinary Tract: No adrenal abnormality. No focal renal abnormality. No stones or hydronephrosis. Urinary bladder is unremarkable. Stomach/Bowel: Markedly dilated small bowel diffusely. Right colon and transverse colon are dilated to the distal transverse colon/splenic flexure where there is circumferential wall thickening and mucosal enhancement. Favor colitis although cancer cannot be excluded. After this area at the splenic flexure, the distal transverse colon and sigmoid colon again become dilated. There is abrupt decompression of the sigmoid colon best seen on sagittal image 82 and axial image 80. Remainder the rectosigmoid colon are decompressed. This areas concerning for possible tumor/malignancy. Vascular/Lymphatic: No evidence of aneurysm or adenopathy. Reproductive: No visible focal abnormality. Other: No free fluid or free air. Musculoskeletal: No acute bony abnormality. IMPRESSION: 2 areas of strictures in the colon. The 1st is at the splenic flexure with circumferential wall thickening over several cm and mucosal enhancement. This could reflect an area of colitis or malignancy. Second colonic stricture is seen in the sigmoid colon. This area is concerning  for malignancy. Bowel obstruction related to these 2 colonic strictures. Small bowel is diffusely dilated, fluid-filled with air-fluid levels. Right colon and transverse colon are also dilated. Bibasilar atelectasis. Electronically Signed   By: Rolm Baptise M.D.   On: 01/03/2022 22:08    Pathology:  SURGICAL PATHOLOGY  CASE: [id number]  PATIENT: [facility]  Surgical Pathology Report   Clinical History:  large bowel obstruction (cm)   FINAL MICROSCOPIC DIAGNOSIS:   A. LEFT COLON, SIGMOID AND DISTAL TRANSVERSE, PARTIAL COLECTOMY:  Invasive well differentiated adenocarcinoma  Tumor invades to and involves the serosal surface/visceral peritoneum  (pT4a)  Tumor measures 3.5 x 2.0 x 2.0 cm  Margins free  Three of 29 pericolic lymph nodes with metastatic carcinoma (3/29, pN1a)   COMMENT:   Sections show a tumor composed of irregular angulated glands diffusely  infiltrating the submucosa muscularis propria and extending into the  serosa and pericolic adipose.  Focally there appears to be a  pre-existing adenoma with high-grade dysplasia.  Given the unusual  diffuse infiltrative pattern four immunohistochemical stains are  performed with adequate control to confirm a colonic origin.  The tumor  is diffusely and strongly positive for the colonic markers cytokeratin  20 and CDX2.  The tumor is negative for cytokeratin 7 pulmonary adeno  marker TTF-1.   ONCOLOGY TABLE:   COLON AND RECTUM, CARCINOMA:  Resection   Procedure: Partial colectomy  Tumor Site: Sigmoid colon  Tumor Size: 3.5 x 2.0 x 2.0 cm  Macroscopic Tumor Perforation: Not identified  Macroscopic Evaluation of Mesorectum (required for rectal cancer): Not  applicable  Histologic Type: Adenocarcinoma  Histologic Grade: Well differentiated (low-grade)  Multiple Primary Sites: Not applicable  Tumor Extension: Tumor invades to the serosal surface to involve the  visceral peritoneum  Lymphovascular Invasion: Not identified  Perineural Invasion: Not identified  Treatment Effect: No known presurgical therapy  Margins:       Margin Status for Invasive Carcinoma: All margins negative for  invasive carcinoma       Distance from Invasive Carcinoma to Radial (Circumferential) Margin  (required for rectal            tumors): Not applicable (not a rectal tumor)       Distance from Invasive Carcinoma to Closest Mucosal: 6 cm from   distal margin       Margin Status for Non-Invasive Tumor: All margins negative for  high-grade dysplasia / intramucosal            carcinoma and low-grade dysplasia  Regional Lymph Nodes:       Number of Lymph Nodes with Tumor: 3       Number of Lymph Nodes Examined: 29  Tumor Deposits: Not identified  Distant Metastasis: Not applicable  Pathologic Stage Classification (pTNM, AJCC 8th Edition): pT4a, pN1a  Ancillary Studies: MMR / MSI testing has been ordered and the results to  be issued in an addendum.  Representative Tumor Block: A4  Comments: None   (v4.2.0.1)   GROSS DESCRIPTION:   Specimen: Colon resection, clinically left, including distal transverse  and sigmoid, received fresh.  Specimen integrity: Intact and unopened  Orientation: Suture marks distal.  Specimen length: 78 cm  Mesorectal intactness: Not applicable  Tumor location: Sigmoid, predominantly antimesenteric wall.  Tumor size: 2 cm in length and 3.5 cm in diameter tan-red firm sessile  mass.  Percent of bowel circumference involved: 70%, stenosing the lumen to  less than 1 cm.  Tumor distance to margins:  Proximal: 70 cm                       Distal: 6 cm                       Mesenteric (sigmoid and transverse): 3 cm  Macroscopic extent of tumor invasion: Tumor involves full-thickness of  the muscularis, and slightly into underlying fat.  Tumor abuts but does  not grossly involve serosa.  Total presumed lymph nodes: 29 possible lymph nodes which range from 0.1  to 1.3 cm.  The largest is somewhat firm and found within 3 cm of the  tumor.  Extramural satellite tumor nodules: None  Mucosal polyp(s): None  Additional findings: None  Block summary:  Block 1 = proximal margin  Block 2 = distal margin  Block 3 = tumor  Blocks 4, 5 = tumor abutting serosa  Block 6 = tissue for molecular testing  Block 7 = 5 possible nodes  Block 8 = 5 possible nodes  Block 9 = 5 possible nodes   Block 10 = 5 possible nodes  Block 11 = 5 possible nodes  Block 12 = 1 node bisected  Block 13 = 1 node bisected  Block 14 = 1 node bisected  Block 15 = largest node, bisected   SW 01/07/2022   Final Diagnosis performed by Tobin Chad, MD.   Electronically  signed 01/15/2022   Assessment and Plan:  Stage IIIa (pT4a, pN1a) well-differentiated colon adenocarcinoma -01/03/2022 CT abdomen/pelvis with contrast-2 areas of stricture in the colon, bowel obstruction related to these 2 colonic strictures -01/04/2022 CEA was 5.5 -Status post left hemicolectomy with end colostomy 01/04/2022-surgical path shows a well differentiated colon adenocarcinoma, pT4a pN1a -01/15/2022 CT chest without contrast-no worrisome pulmonary nodules to suggest pulmonary metastatic disease, no mediastinal or hilar adenopathy. Microcytic anemia Thrombocytosis, likely reactive Leukocytosis Protein calorie malnutrition 6.  History of alcohol abuse 7.  High output colostomy  Mr. Dolley has a new diagnosis of colon cancer.  Staging work-up consistent with stage IIIa disease.  Discussed the diagnosis with the patient.  We had initial discussions today regarding adjuvant systemic chemotherapy.  We will consider systemic chemotherapy once his nutritional status improves and wounds have healed.  Chemotherapy will be initiated as an outpatient.  He remains on TPN which will be stopped later today.  He is currently on a soft diet.  Diet is being advanced per general surgery.  Dietitian following.  The patient has a poor social situation.  TOC following and plan right now appears to be long-term care placement.  Recommendations: 1.  Continue postop care per general surgery. 2.  Advance diet per general surgery.  Dietitian continues to follow. 3.  We will arrange outpatient follow-up at the cancer center to discuss systemic treatment options.  Thank you for this referral.   Mikey Bussing, DNP, AGPCNP-BC, AOCNP

## 2022-01-17 NOTE — Progress Notes (Signed)
Mobility Specialist Progress Note:   01/17/22 0955  Mobility  Activity Turned to left side;Turned to back - supine;Moved into chair position in bed;Turned to right side  Assistive Device None  Activity Response Tolerated well  $Mobility charge 1 Mobility   Pt declining any OOB mobility at this time. Requesting to get repositioned in bed to eat breakfast. Pt also turned to replace mat under buttocks. Left with all needs met, bed alarm on.   Nelta Numbers Acute Rehab Secure Chat or Office Phone: [phone]

## 2022-01-17 NOTE — Progress Notes (Signed)
Nutrition Follow-up  DOCUMENTATION CODES:  Non-severe (moderate) malnutrition in context of social or environmental circumstances  INTERVENTION:  Continue current diet as ordered, encouraged PO intake Ensure Enlive po BID, each supplement provides 350 kcal and 20 grams of protein. Initiate PO vitamin regimen MVI, thiamine, and folic acid for hx of EtOH abuse  NUTRITION DIAGNOSIS:  Moderate Malnutrition related to social / environmental circumstances (etoh abuse) as evidenced by mild fat depletion, moderate muscle depletion. - remains applicable  GOAL:  Patient will meet greater than or equal to 90% of their needs - progressing, TPN being weaned, diet and supplements in place  MONITOR:  Labs, Diet advancement, Weight trends, Skin, I & O's, Other (Comment) (TPN)  REASON FOR ASSESSMENT:  Consult New TPN/TNA  ASSESSMENT:  66 y/o male with h/o etoh abuse who is admitted with LBO secondary to colon mass now s/p L sigmoid and distal transverse colecotmy with colostomy 3/55 complicated by post op ileus.  Pt resting in bed at the time of assessment, breakfast tray at bedside ~50% consumed and boost breeze also mostly consumed. Pt reports that his appetite is improving and he is eating better. Denies any GI distress. Like supplements, prefers the ensure shakes to boost breeze, will add. TPN was cut to half rate with plans to dc tonight when current bag expires. Discussed with pt the importance of making sue he continues to increase his intake and consume supplements to meet needs as he will no longer have the support form TPN. Pt agreeable.   Discussed with RN. Noted still a lot of output from colostomy but much improved from last week. RN reports that stool has been a lot of undigested food and it has gotten obstructed at the pouch opening and she has had to manually clear.    Colostomy output: 1473m on the last 24 hours.   Average Meal Intake: 7/11-7/13: 32% intake x 3 recorded  meals  Nutritionally Relevant Medications: Continuous Infusions:  lactated ringers 50 mL/hr at 01/16/22 2203   TPN ADULT (ION) 50 mL/hr at 01/16/22 1837   Labs Reviewed: Sodium 133 BUN 7, creatinine .457 NUTRITION - FOCUSED PHYSICAL EXAM: Flowsheet Row Most Recent Value  Orbital Region Mild depletion  Upper Arm Region No depletion  Thoracic and Lumbar Region Mild depletion  Buccal Region Mild depletion  Temple Region Moderate depletion  Clavicle and Acromion Bone Region Moderate depletion  Scapular Bone Region Mild depletion  Dorsal Hand Moderate depletion  Patellar Region Moderate depletion  Anterior Thigh Region Mild depletion  Posterior Calf Region Moderate depletion  Edema (RD Assessment) None  Hair Reviewed  Eyes Reviewed  Mouth Reviewed  Skin Reviewed  Nails Reviewed   Diet Order:   Diet Order             DIET SOFT Room service appropriate? Yes; Fluid consistency: Thin  Diet effective now                   EDUCATION NEEDS:  Education needs have been addressed  Skin:  Skin Assessment: Reviewed RN Assessment (incision abdomen)  Last BM:  7/13  Height:  Ht Readings from Last 1 Encounters:  01/04/22 '5\' 9"'$  (1.753 m)    Weight:  Wt Readings from Last 1 Encounters:  01/17/22 98.4 kg    Ideal Body Weight:  72.7 kg  BMI:  Body mass index is 32.04 kg/m.  Estimated Nutritional Needs:  Kcal:  2000-2300kcal/day Protein:  100-115g/day Fluid:  1.9-2.2L/day    RRanell Patrick  RD, LDN Clinical Dietitian RD pager # available in [place]  After hours/weekend pager # available in [facility]

## 2022-01-17 NOTE — TOC Progression Note (Addendum)
Transition of Care [facility]) - Progression Note    Patient Details  Name: Carl Barr MRN: [id number] Date of Birth: 07/10/1955  Transition of Care [facility]) CM/SW [place], [place] Phone Number: 01/17/2022, 10:05 AM  Clinical Narrative:    CSW spoke with pt about possible LTC, pt is agreeable stating that he does not want to learn how to care for his ostomy. Pt dose snot have medicaid at the moment but CSW sent an e-mail to financial services to start pt medicaid. CSW will follow up with facilities that may take pt long term.  Tracey at [facility] is considering for LTC.   Expected Discharge Plan: [place] Barriers to Discharge: Continued Medical Work up, SNF Pending bed offer  Expected Discharge Plan and Services Expected Discharge Plan: [place] In-house Referral: Clinical Social Work Discharge Planning Services: CM Consult Post Acute Care Choice: [place] arrangements for the past 2 months: [place]                   DME Agency:  (Await PT eval)       HH Arranged: Therapist, sports (Await PT eval) [place] Agency: [place]         Social Determinants of Health (SDOH) Interventions    Readmission Risk Interventions     No data to display

## 2022-01-17 NOTE — Plan of Care (Signed)
Vitals stable, no complained of pain.  Problem: Nutrition: Goal: Adequate nutrition will be maintained Outcome: Progressing   Problem: Elimination: Goal: Will not experience complications related to bowel motility Outcome: Progressing

## 2022-01-17 NOTE — Progress Notes (Signed)
Patient ID: Carl Barr, male   DOB: 25-Mar-1956, 66 y.o.   MRN: [id number]   Acute Care Surgery Service Progress Note:    Chief Complaint/Subjective: Tolerating liquids, denies nausea/vomiting. Colostomy output 1400.  Objective: Vital signs in last 24 hours: Temp:  [98 F (36.7 C)-98.7 F (37.1 C)] 98 F (36.7 C) (07/13 0844) Pulse Rate:  [85-93] 85 (07/13 0844) Resp:  [17-18] 17 (07/13 0844) BP: (114-129)/(58-73) 119/72 (07/13 0844) SpO2:  [99 %-100 %] 99 % (07/13 0844) Weight:  [98.4 kg] 98.4 kg (07/13 0427) Last BM Date : 01/16/22  Intake/Output from previous day: 07/12 0701 - 07/13 0700 In: 2432.6 [P.O.:240; I.V.:1919.7; IV Piggyback:272.9] Out: 4100 [Urine:2650; TUUEK:8003] Intake/Output this shift: No intake/output data recorded.  General: WD, obese male who is laying in bed in NAD Lungs: Respiratory effort nonlabored Abd: soft, nondistended, nontender; incision clean and dry, open at inferior aspect. Colostomy is pink and productive of loose stool.  Lab Results: CBC  Recent Labs    01/16/22 0336 01/17/22 0253  WBC 17.8* 16.1*  HGB 8.4* 8.6*  HCT 25.8* 26.2*  PLT 461* 476*   BMET Recent Labs    01/16/22 0336 01/17/22 0253  NA 135 133*  K 4.7 4.5  CL 103 102  CO2 24 24  GLUCOSE 129* 118*  BUN 9 7*  CREATININE 0.39* 0.49*  CALCIUM 8.4* 8.6*   LFT    Latest Ref Rng & Units 01/17/2022    2:53 AM 01/14/2022    3:37 AM 01/09/2022   12:50 AM  Hepatic Function  Total Protein 6.5 - 8.1 g/dL 7.2  7.2  6.2   Albumin 3.5 - 5.0 g/dL 2.0  2.0  2.0   AST 15 - 41 U/L 55  18  19   ALT 0 - 44 U/L 66  24  16   Alk Phosphatase 38 - 126 U/L 62  47  39   Total Bilirubin 0.3 - 1.2 mg/dL 0.4  0.5  1.0    PT/INR No results for input(s): "LABPROT", "INR" in the last 72 hours. ABG No results for input(s): "PHART", "HCO3" in the last 72 hours.  Invalid input(s): "PCO2", "PO2"  Studies/Results:  Anti-infectives: Anti-infectives (From admission, onward)     Start     Dose/Rate Route Frequency Ordered Stop   01/04/22 0830  cefoTEtan (CEFOTAN) 2 g in sodium chloride 0.9 % 100 mL IVPB        2 g 200 mL/hr over 30 Minutes Intravenous  Once 01/04/22 0805 01/04/22 1010       Medications: Scheduled Meds:  Chlorhexidine Gluconate Cloth  6 each Topical Daily   enoxaparin (LOVENOX) injection  40 mg Subcutaneous Q24H   sodium chloride flush  10-40 mL Intracatheter Q12H   Continuous Infusions:  lactated ringers 50 mL/hr at 01/16/22 2203   methocarbamol (ROBAXIN) IV 1,000 mg (01/17/22 0535)   TPN ADULT (ION) 50 mL/hr at 01/16/22 1837   PRN Meds:.HYDROmorphone (DILAUDID) injection, sodium chloride flush  Assessment/Plan: Patient Active Problem List   Diagnosis Date Noted   Adenocarcinoma of colon ([place]) 01/15/2022   Malnutrition of moderate degree 01/11/2022   Open abdominal wall wound 01/11/2022   Poor social situation 01/10/2022   Hypomagnesemia 01/09/2022   Hypophosphatemia 01/09/2022   Alcohol use 01/04/2022   Hypokalemia 01/04/2022   Large bowel obstruction ([place]) 01/03/2022   Lumbar degenerative disc disease 12/16/2018   Chronic pain of left knee 12/16/2018   Encounter for long-term (current) use of medications 12/16/2018   s/p  Procedure(s): LEFT SIGMOID AND DISTAL TRANSVERSE COLECTOMY COLOSTOMY 01/04/2022  Colonic obstruction secondary to sigmoid mass and thickening of splenic flexure POD#13 S/P open extended left hemicolectomy with end colostomy - Surgical pathology shows a well-differentiated colon adenocarcinoma, stage pT4aN1a. Chest CT shows no evidence of metastatic disease. Patient has been referred to Dr. Benay Spice and has appointment on 7/19. - CT 7/5: with ileus but no abscess or leak noted, mild wall thickening of transverse colon - Large volume stool from ostomy: improving. Hold imodium given postop ileus. Output is thickening with more solid intake. - Continue soft diet, discontinue TPN after current bag runs out -  Mobilize, PT following - Leukocytosis: likely secondary to asplenia, improving (WBC down to 16 today). - FEN: Soft diet, stop TPN VTE: LMWH Dispo: SNF placement    LOS: 14 days   Michaelle Birks, MD [facility] Surgery General, Hepatobiliary and Pancreatic Surgery 01/17/22 8:45 AM

## 2022-01-17 NOTE — Progress Notes (Addendum)
PHARMACY - TOTAL PARENTERAL NUTRITION CONSULT NOTE   Indication: Post-op ileus  Patient Measurements: Height: '5\' 9"'$  (175.3 cm) Weight: 98.4 kg (216 lb 14.9 oz) IBW/kg (Calculated) : 70.7 TPN AdjBW (KG): 81.6 Body mass index is 32.04 kg/m. Usual Weight: patient does not know. Last charted weight was 231 lbs in Jan 2021   Assessment:  66 yo M with colonic obstruction secondary to sigmoid mass s/p open L hemicolectomy with end colostomy 6/30. Patient failed CLD 7/6, had emesis with persistent nausea. NGT is recommended but patient is refusing. Imaging shows post-operative ileus. Patient with no/poor PO intake > 7 days. Patient is requiring very high amounts of K repletion with persistently low K, likely due to high losses in ostomy. Patient is at risk of refeeding. Pharmacy consulted for TPN.   Difficulty tolerating CLD 7/09 (nausea/vomiting episodes following Ensure shakes). Ostomy output remains high. CLD continued 7/10. Appears to have better tolerated diet. No additional reported vomiting.  Now tolerating FLD with no additional emesis reported.  Glucose / Insulin: BG < 150, no hx DM, not needed sSSI x2 days Electrolytes: CoCa 10.2, others wnl  Renal: Scr 0.39, BUN wnl Hepatic: LFT mildly elevated 55/66; Tbili wnl, albumin 2.0; TG 122 Intake / Output; MIVF: LR @ 32m/hr;  UOP 2650 mL, ostomy output 1450 mL   GI Imaging: 6/29 CT abd- 2 strictures in colon with bowel obstruction, possible colitis or malignancy 7/3 AbdXR- post-op ileus  7/5 AbdXR- partial SBO or ileus, worse than prior 7/5 CT abd- post-op, likely ileus  GI Surgeries / Procedures:  6/30 open L hemicolectomy with end colostomy  Central access: 7/07 PICC ordered  TPN start date: 7/07  Nutritional Goals: Goal TPN rate is 100 mL/hr (provides 130 g of protein, 362g dextrose, 80g lipids, and 2510 kcals per day)  RD Assessment: pending Estimated Needs Total Energy Estimated Needs: 2000-2300kcal/day Total Protein  Estimated Needs: 100-115g/day Total Fluid Estimated Needs: 1.9-2.2L/day  Current Nutrition:  7/07 TPN 7/10 advancing to CLD 7/11 advancing to FLD 7/12 ate few bites of green beans (didn't eat grits d/t no butter available), drank soda, coffee, milk, juice, a little Boost 7/13 ate ~40% of breakfast per RN   Plan:  Stop TPN per team  IVF per team   Thank you for allowing pharmacy to be a part of this patient's care.  AArdyth Harps PharmD Clinical Pharmacist

## 2022-01-17 NOTE — Progress Notes (Signed)
Progress Note Patient: Carl Barr VOH:[id number] DOB: 07/28/1955 DOA: 01/03/2022  DOS: the patient was seen and examined on 01/17/2022  Brief hospital course: Carl Barr is a 66 yo male with PMH lumbar DDD who presented with N/V and inability to pass gas/bowel movements.  CT abdomen/pelvis on admission noted 2 areas of stricture in the colon involving the splenic flexure and sigmoid colon.  Area was concerning for malignancy.  He was evaluated by general surgery.  He underwent left sigmoid and distal transverse colectomy with creation of colostomy on 01/04/2022.  He then developed a postop ileus with ongoing high output. Assessment and Plan: Large bowel obstruction (HCC) CT abdomen/pelvis on admission noted 2 areas of stricture in the colon involving the splenic flexure and sigmoid colon. Underwent sigmoid and distal transverse colectomy with creation of colostomy on 01/04/2022 PICC line placed on 01/11/2022 C. difficile negative on 01/13/2022   Open abdominal wall wound Earlier in the hospital, patient had further bleeding from lower abdominal incision,  underwent staple removal of lower incision and started on BID dressing changes with saline moistened gauze per surgery currently resolved.   Adenocarcinoma of colon [facility]) Pathology has returned with well differentiated adenocarcinoma;  pericolic lymph nodes also noted with the same Oncology consulted, scheduled to see Dr. Benay Spice  High output colostomy. Patient actually has a colostomy but his output is significantly high which is causing multiple electrolytes abnormalities. Anticipating improvement over time Currently not a good candidate for starting on Imodium or similar medication, as patient did have early ileus postoperatively and continues to remain at high risk.   Poor social situation - Patient lives at home with his cousin. He has very poor health literacy at baseline; with his new colostomy, there is concern for patient being  able to handle this at time of discharge.   - patient needs ongoing ostomy education while inpatient as he's anticipated to go home after rehab unless he transitions into long term care   Malnutrition of moderate degree - Patient's BMI is Body mass index is 27.54 kg/m.. - Patient has the following signs/symptoms consistent with PCM: (Mild fat depletion, moderate muscle depletion) - seen by RD, appreciate assistance.  TPN currently discontinued as of 7/13.  Hypokalemia - Replete as needed   Hypophosphatemia - Replete as needed   Hypomagnesemia - Replete as needed    Alcohol use - No further concern for any withdrawal given prolonged hospitalization  Class I obesity. Body mass index is 32.04 kg/m.  Placing the pt at higher risk of poor outcomes.  Subjective: Denies any acute abdominal pain.  No nausea no vomiting.  No fever no chills.  Remains with significant output from his colostomy bag.  Physical Exam: Vitals:   01/17/22 0426 01/17/22 0427 01/17/22 0844 01/17/22 1732  BP: (!) 114/58  119/72 112/78  Pulse: 89  85 100  Resp: '18  17 16  '$ Temp: 98.6 F (37 C)  98 F (36.7 C) 98.2 F (36.8 C)  TempSrc: Oral  Oral Oral  SpO2: 99%  99% 99%  Weight:  98.4 kg    Height:       General: Appear in mild distress; no visible Abnormal Neck Mass Or lumps, Conjunctiva normal Cardiovascular: S1 and S2 Present, no Murmur, Respiratory: good respiratory effort, Bilateral Air entry present and CTA, no Crackles, no wheezes Abdomen: Bowel Sound present, Non tender  Extremities: no Pedal edema Neurology: alert and oriented to time, place, and person  Gait not checked due to patient safety concerns  Data Reviewed: I have Reviewed nursing notes, Vitals, and Lab results since pt's last encounter. Pertinent lab results CBC and BMP I have ordered test including CBC and BMP    Family Communication: None at bedside  Disposition: Status is: Inpatient Remains inpatient appropriate because:  Need for stabilization of electrolytes and tolerance/advancement of diet  Author: Berle Mull, MD 01/17/2022 6:17 PM  Please look on [email or web address] to find out who is on call.

## 2022-01-17 NOTE — Progress Notes (Signed)
Mobility Specialist Progress Note:   01/17/22 1420  Mobility  Activity Moved into chair position in bed  Assistive Device None  Activity Response Tolerated fair  $Mobility charge 1 Mobility   Pt adamantly refuses any OOB mobility still at this time. C/o abdominal pain and states "it's not the right time". After max encouragement, pt still refusing at this time. Repositioned in bed, bed alarm on.   Nelta Numbers Acute Rehab Secure Chat or Office Phone: [phone]

## 2022-01-18 DIAGNOSIS — K56609 Unspecified intestinal obstruction, unspecified as to partial versus complete obstruction: Secondary | ICD-10-CM | POA: Diagnosis not present

## 2022-01-18 LAB — BASIC METABOLIC PANEL
Anion gap: 8 (ref 5–15)
BUN: 10 mg/dL (ref 8–23)
CO2: 23 mmol/L (ref 22–32)
Calcium: 8.6 mg/dL — ABNORMAL LOW (ref 8.9–10.3)
Chloride: 102 mmol/L (ref 98–111)
Creatinine, Ser: 0.59 mg/dL — ABNORMAL LOW (ref 0.61–1.24)
GFR, Estimated: >60 mL/min (ref >60)
Glucose, Bld: 101 mg/dL — ABNORMAL HIGH (ref 70–99)
Potassium: 4.3 mmol/L (ref 3.5–5.1)
Sodium: 133 mmol/L — ABNORMAL LOW (ref 135–145)

## 2022-01-18 LAB — CBC
HCT: 27.4 % — ABNORMAL LOW (ref 39.0–52.0)
Hemoglobin: 9.1 g/dL — ABNORMAL LOW (ref 13.0–17.0)
MCH: 24.7 pg — ABNORMAL LOW (ref 26.0–34.0)
MCHC: 33.2 g/dL (ref 30.0–36.0)
MCV: 74.5 fL — ABNORMAL LOW (ref 80.0–100.0)
Platelets: 520 10*3/uL — ABNORMAL HIGH (ref 150–400)
RBC: 3.68 MIL/uL — ABNORMAL LOW (ref 4.22–5.81)
RDW: 18.6 % — ABNORMAL HIGH (ref 11.5–15.5)
WBC: 15.4 10*3/uL — ABNORMAL HIGH (ref 4.0–10.5)
nRBC: 0.1 % (ref 0.0–0.2)

## 2022-01-18 NOTE — Consult Note (Signed)
[place]  Telephone:([phone]) [phone] Fax:(336) [phone]   MEDICAL ONCOLOGY - INITIAL CONSULTATION  Referral MD: Dr. 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  He presented to the emergency department due to difficulty moving bowels x5 days.  Prior to admission, he developed some nausea and poor appetite.  Due to constipation, he took laxatives without relief.  He has never had a previous colonoscopy or routine medical follow-up.  On admission, his WBC and hemoglobin were normal.  However, his MCV was low at 77.  Platelets were mildly elevated at 420,000.  Sodium was 134, potassium 2.9, albumin 3.4.  CT abdomen/pelvis with contrast was performed which showed 2 areas of strictures in the colon-the first is at the splenic flexure with circumferential wall thickening over several centimeters and mucosal enhancement which could reflect colitis versus malignancy and the second colonic stricture was seen at the sigmoid colon and area concerning for malignancy.  CT showed bowel obstruction related to these 2 colonic strictures.  CEA was obtained on 01/04/2022 and was mildly elevated at 5.5.  He was taken to the OR by general surgery on 01/04/2022 for a left, sigmoid, and distal transverse colectomy, colostomy.  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  CT chest without contrast was performed on 01/15/2022 which showed no worrisome pulmonary nodules to suggest pulmonary metastatic disease, no mediastinal or hilar mass or adenopathy, scattered low-attenuation liver lesions, likely benign cysts.  He has had large volume stool from his ostomy which is slowly improving.  Remains on TPN.  Prior to admission, he states that he was having abdominal discomfort.  Appetite was decreased.  He is not sure if he was losing  weight.  He had some mild nausea without vomiting.  No recent melena or hematochezia.  He denies headaches, dizziness, chest pain, shortness of breath.  The patient is not married.  He has no children.  States that he was living with his cousin and thinks that he may go to a facility following hospital discharge.  The patient denies prior tobacco use.  States that he previously drank alcohol but does not quantify how much he drank.  Cannot tell me when he quit.  States that he had an aunt who had cancer.  He is not sure what type or what side of the family this aunt is on. Medical oncology was asked see the patient make recommendations regarding his newly diagnosed colon adenocarcinoma.  Past Medical History:  Diagnosis Date   Alcohol intoxication ([place])    Lumbar degenerative disc disease    Lumbar radiculopathy, right   :  Past Surgical History:  Procedure Laterality Date   COLOSTOMY N/A 01/04/2022   Procedure: COLOSTOMY;  Surgeon: Georganna Skeans, MD;  Location: [facility];  Service: General;  Laterality: N/A;   denies past surgery     PARTIAL COLECTOMY N/A 01/04/2022   Procedure: LEFT SIGMOID AND DISTAL TRANSVERSE COLECTOMY;  Surgeon: Georganna Skeans, MD;  Location: [facility];  Service: General;  Laterality: N/A;  :   Current Facility-Administered Medications  Medication Dose Route Frequency Provider Last Rate Last Admin   Chlorhexidine Gluconate Cloth 2 % PADS 6 each  6 each Topical Daily Lavina Hamman, MD   6 each at 01/17/22 1027   enoxaparin (LOVENOX) injection 40 mg  40 mg Subcutaneous Q24H Lavina Hamman, MD  40 mg at 01/17/22 1027   HYDROmorphone (DILAUDID) injection 0.5 mg  0.5 mg Intravenous Q3H PRN Lavina Hamman, MD   0.5 mg at 01/13/22 2000   lactated ringers infusion   Intravenous Continuous Donnamae Jude, RPH 50 mL/hr at 01/16/22 2203 New Bag at 01/16/22 2203   methocarbamol (ROBAXIN) 1,000 mg in dextrose 5 % 100 mL IVPB  1,000 mg Intravenous Q8H Jesusita Oka, MD 220 mL/hr at  01/17/22 0535 1,000 mg at 01/17/22 0535   sodium chloride flush (NS) 0.9 % injection 10-40 mL  10-40 mL Intracatheter PRN Dwyane Dee, MD       sodium chloride flush (NS) 0.9 % injection 10-40 mL  10-40 mL Intracatheter Q12H Dwyane Dee, MD   10 mL at 01/17/22 1027   TPN ADULT (ION)   Intravenous Continuous TPN Lavina Hamman, MD 50 mL/hr at 01/16/22 1837 Infusion Verify at 01/16/22 1837   No Known Allergies:   Family History  Problem Relation Age of Onset   Diabetes Neg Hx    Hypertension Neg Hx   :  Social History   Socioeconomic History   Marital status: Single    Spouse name: Not on file   Number of children: Not on file   Years of education: Not on file   Highest education level: Not on file  Occupational History   Not on file  Tobacco Use   Smoking status: Former   Smokeless tobacco: Never   Tobacco comments:    Patient DENIES 07/10/2018  Vaping Use   Vaping Use: Never used  Substance and Sexual Activity   Alcohol use: Yes    Comment: "alot"- patient DENIES 07/10/2018   Drug use: Not Currently   Sexual activity: Not Currently  Other Topics Concern   Not on file  Social History Narrative   Not on file   Social Determinants of Health   Financial Resource Strain: Not on file  Food Insecurity: Not on file  Transportation Needs: Not on file  Physical Activity: Not on file  Stress: Not on file  Social Connections: Not on file  Intimate Partner Violence: Not on file  :  Review of Systems: A comprehensive 14 point review of systems was negative except as noted in the HPI.  Exam: Patient Vitals for the past 24 hrs:  BP Temp Temp src Pulse Resp SpO2 Weight  01/17/22 0844 119/72 98 F (36.7 C) Oral 85 17 99 % --  01/17/22 0427 -- -- -- -- -- -- 98.4 kg  01/17/22 0426 (!) 114/58 98.6 F (37 C) Oral 89 18 99 % --  01/16/22 2046 129/68 -- -- 93 17 99 % --  01/16/22 1646 116/73 98.7 F (37.1 C) Oral 90 17 100 % --    General: Sitting up in bed, no  distress. Eyes:  no scleral icterus.   ENT:  There were no oropharyngeal lesions.   Lymphatics:  Negative cervical, supraclavicular, axillary, or inguinal adenopathy.   Respiratory: lungs were clear bilaterally without wheezing or crackles.   Cardiovascular:  Regular rate and rhythm, S1/S2, without murmur, rub or gallop.  There was no pedal edema.   GI: Positive bowel sounds, abdomen mildly distended, midline incision with staples, gauze packing at the lower aspect of the midline wound, colostomy in the left lower quadrant with a large amount of liquid brown stool Skin exam was without echymosis, petichae.   Neuro exam was nonfocal. Patient was alert and oriented.  Attention was good.  Language was appropriate.  Mood was normal without depression.  Speech was not pressured.  Thought content was not tangential.     Lab Results  Component Value Date   WBC 16.1 (H) 01/17/2022   HGB 8.6 (L) 01/17/2022   HCT 26.2 (L) 01/17/2022   PLT 476 (H) 01/17/2022   GLUCOSE 118 (H) 01/17/2022   TRIG 122 01/17/2022   ALT 66 (H) 01/17/2022   AST 55 (H) 01/17/2022   NA 133 (L) 01/17/2022   K 4.5 01/17/2022   CL 102 01/17/2022   CREATININE 0.49 (L) 01/17/2022   BUN 7 (L) 01/17/2022   CO2 24 01/17/2022    CT CHEST WO CONTRAST  Result Date: 01/15/2022 CLINICAL DATA:  Metastatic colon cancer. EXAM: CT CHEST WITHOUT CONTRAST TECHNIQUE: Multidetector CT imaging of the chest was performed following the standard protocol without IV contrast. RADIATION DOSE REDUCTION: This exam was performed according to the departmental dose-optimization program which includes automated exposure control, adjustment of the mA and/or kV according to patient size and/or use of iterative reconstruction technique. COMPARISON:  None Available. FINDINGS: Cardiovascular: The heart is mildly enlarged mainly due to left ventricular enlargement. No pericardial effusion. The aorta is normal in caliber. No atherosclerotic calcifications. No  definite coronary artery calcifications. Mediastinum/Nodes: No mediastinal or hilar mass or lymphadenopathy. The esophagus is grossly normal. Lungs/Pleura: Streaky areas of subsegmental atelectasis. No infiltrates or effusions. No worrisome pulmonary nodules to suggest pulmonary metastatic disease. 4 mm perifissural nodule in the right middle lobe adjacent to the minor fissure is most consistent with a benign lymph node. Upper Abdomen: Scattered low-attenuation liver lesions, likely benign cysts. No upper abdominal adenopathy. Musculoskeletal: No chest wall mass. Scattered axillary nodes are likely benign/reactive. The thyroid gland is unremarkable. A right PICC line is in place. No worrisome bone lesions. IMPRESSION: 1. No worrisome pulmonary nodules to suggest pulmonary metastatic disease. 2. No mediastinal or hilar mass or adenopathy. 3. Scattered low-attenuation liver lesions, likely benign cysts. 4. Mild cardiac enlargement mainly due to left ventricular enlargement. Electronically Signed   By: Marijo Sanes M.D.   On: 01/15/2022 13:41   [place] EKG SITE RITE  Result Date: 01/11/2022 If Site Rite image not attached, placement could not be confirmed due to current cardiac rhythm.  CT ABDOMEN PELVIS W CONTRAST  Result Date: 01/09/2022 CLINICAL DATA:  Postop abdominal pain EXAM: CT ABDOMEN AND PELVIS WITH CONTRAST TECHNIQUE: Multidetector CT imaging of the abdomen and pelvis was performed using the standard protocol following bolus administration of intravenous contrast. RADIATION DOSE REDUCTION: This exam was performed according to the departmental dose-optimization program which includes automated exposure control, adjustment of the mA and/or kV according to patient size and/or use of iterative reconstruction technique. CONTRAST:  191mL OMNIPAQUE IOHEXOL 300 MG/ML  SOLN COMPARISON:  CT abdomen and pelvis dated January 03, 2022 FINDINGS: Lower chest: Small left pleural effusion and atelectasis. Hepatobiliary:  Scattered low-attenuation liver lesions, too small to completely characterize, but likely simple cysts. Gallbladder is unremarkable. No biliary ductal dilation. Pancreas: Unremarkable. No pancreatic ductal dilatation or surrounding inflammatory changes. Spleen: Normal in size without focal abnormality. Adrenals/Urinary Tract: Adrenal glands are unremarkable. Kidneys are normal, without renal calculi, focal lesion, or hydronephrosis. Bladder is unremarkable. Stomach/Bowel: Interval sigmoidectomy with left upper quadrant ostomy. Multiple dilated loops of small bowel are seen. Mild wall thickening of the proximal transverse colon. Vascular/Lymphatic: No significant vascular findings are present. No enlarged abdominal or pelvic lymph nodes. Reproductive: Mild prostatomegaly. Other: Postsurgical changes of the anterior abdominal  wall. Small locules of free intraperitoneal air and trace fluid in the left hemiabdomen, likely postsurgical. Musculoskeletal: Diffuse flowing anterior osteophytes, findings can be seen in the setting of diffuse idiopathic skeletal hyperostosis. No acute or significant osseous findings. IMPRESSION: 1. Interval sigmoidectomy with left upper quadrant ostomy. Small locules of free intraperitoneal air and trace fluid in the left hemiabdomen, likely postsurgical. 2. Multiple dilated loops of small bowel, likely due to ileus. 3. Mild wall thickening of the proximal transverse colon, likely reactive. 4. Small left pleural effusion. Electronically Signed   By: Yetta Glassman M.D.   On: 01/09/2022 17:10   DG Abd Portable 1V  Result Date: 01/09/2022 CLINICAL DATA:  Ileus EXAM: PORTABLE ABDOMEN - 1 VIEW COMPARISON:  January 07, 2022 FINDINGS: Multiple air-filled dilated small bowel loops are identified in the abdomen and pelvis. There is air identified in the rectal sigmoid colon. Midline surgical staples are identified. Degenerative joint changes of the spine are noted. IMPRESSION: Findings consistent  with partial small bowel obstruction or ileus. This is worsened compared prior exam. Electronically Signed   By: Abelardo Diesel M.D.   On: 01/09/2022 08:12   DG Abd 1 View  Result Date: 01/07/2022 CLINICAL DATA:  Encounter for NG tube EXAM: ABDOMEN - 1 VIEW COMPARISON:  CT 01/03/2022. FINDINGS: Nasogastric tube is coiled in the mid chest with tip coursing cephalad outside of the field of view. There is gaseous distention of bowel in the upper abdomen. Midline surgical staples noted. Low lung volumes with elevated right hemidiaphragm and right basilar atelectasis. IMPRESSION: Nasogastric tube coils in the mid chest with tip coursing cephalad outside of the field of view. Recommend repositioning/replacement. Gaseous distension of bowel in the upper abdomen compatible with postoperative ileus. Recommend continued radiographic follow-up. These results will be called to the ordering clinician or representative by the Radiologist Assistant, and communication documented in the PACS or [employer]. Electronically Signed   By: Maurine Simmering M.D.   On: 01/07/2022 13:02   CT Abdomen Pelvis W Contrast  Result Date: 01/03/2022 CLINICAL DATA:  Bowel obstruction suspected EXAM: CT ABDOMEN AND PELVIS WITH CONTRAST TECHNIQUE: Multidetector CT imaging of the abdomen and pelvis was performed using the standard protocol following bolus administration of intravenous contrast. RADIATION DOSE REDUCTION: This exam was performed according to the departmental dose-optimization program which includes automated exposure control, adjustment of the mA and/or kV according to patient size and/or use of iterative reconstruction technique. CONTRAST:  43mL OMNIPAQUE IOHEXOL 350 MG/ML SOLN COMPARISON:  None Available. FINDINGS: Lower chest: Bibasilar linear opacities, likely atelectasis. Hepatobiliary: Scattered subcentimeter hypodensities in the liver, likely small cysts. Gallbladder unremarkable. Pancreas: No focal abnormality or ductal  dilatation. Spleen: No focal abnormality.  Normal size. Adrenals/Urinary Tract: No adrenal abnormality. No focal renal abnormality. No stones or hydronephrosis. Urinary bladder is unremarkable. Stomach/Bowel: Markedly dilated small bowel diffusely. Right colon and transverse colon are dilated to the distal transverse colon/splenic flexure where there is circumferential wall thickening and mucosal enhancement. Favor colitis although cancer cannot be excluded. After this area at the splenic flexure, the distal transverse colon and sigmoid colon again become dilated. There is abrupt decompression of the sigmoid colon best seen on sagittal image 82 and axial image 80. Remainder the rectosigmoid colon are decompressed. This areas concerning for possible tumor/malignancy. Vascular/Lymphatic: No evidence of aneurysm or adenopathy. Reproductive: No visible focal abnormality. Other: No free fluid or free air. Musculoskeletal: No acute bony abnormality. IMPRESSION: 2 areas of strictures in the colon. The 1st is  at the splenic flexure with circumferential wall thickening over several cm and mucosal enhancement. This could reflect an area of colitis or malignancy. Second colonic stricture is seen in the sigmoid colon. This area is concerning for malignancy. Bowel obstruction related to these 2 colonic strictures. Small bowel is diffusely dilated, fluid-filled with air-fluid levels. Right colon and transverse colon are also dilated. Bibasilar atelectasis. Electronically Signed   By: Rolm Baptise M.D.   On: 01/03/2022 22:08     CT CHEST WO CONTRAST  Result Date: 01/15/2022 CLINICAL DATA:  Metastatic colon cancer. EXAM: CT CHEST WITHOUT CONTRAST TECHNIQUE: Multidetector CT imaging of the chest was performed following the standard protocol without IV contrast. RADIATION DOSE REDUCTION: This exam was performed according to the departmental dose-optimization program which includes automated exposure control, adjustment of the mA  and/or kV according to patient size and/or use of iterative reconstruction technique. COMPARISON:  None Available. FINDINGS: Cardiovascular: The heart is mildly enlarged mainly due to left ventricular enlargement. No pericardial effusion. The aorta is normal in caliber. No atherosclerotic calcifications. No definite coronary artery calcifications. Mediastinum/Nodes: No mediastinal or hilar mass or lymphadenopathy. The esophagus is grossly normal. Lungs/Pleura: Streaky areas of subsegmental atelectasis. No infiltrates or effusions. No worrisome pulmonary nodules to suggest pulmonary metastatic disease. 4 mm perifissural nodule in the right middle lobe adjacent to the minor fissure is most consistent with a benign lymph node. Upper Abdomen: Scattered low-attenuation liver lesions, likely benign cysts. No upper abdominal adenopathy. Musculoskeletal: No chest wall mass. Scattered axillary nodes are likely benign/reactive. The thyroid gland is unremarkable. A right PICC line is in place. No worrisome bone lesions. IMPRESSION: 1. No worrisome pulmonary nodules to suggest pulmonary metastatic disease. 2. No mediastinal or hilar mass or adenopathy. 3. Scattered low-attenuation liver lesions, likely benign cysts. 4. Mild cardiac enlargement mainly due to left ventricular enlargement. Electronically Signed   By: Marijo Sanes M.D.   On: 01/15/2022 13:41   [place] EKG SITE RITE  Result Date: 01/11/2022 If Site Rite image not attached, placement could not be confirmed due to current cardiac rhythm.  CT ABDOMEN PELVIS W CONTRAST  Result Date: 01/09/2022 CLINICAL DATA:  Postop abdominal pain EXAM: CT ABDOMEN AND PELVIS WITH CONTRAST TECHNIQUE: Multidetector CT imaging of the abdomen and pelvis was performed using the standard protocol following bolus administration of intravenous contrast. RADIATION DOSE REDUCTION: This exam was performed according to the departmental dose-optimization program which includes automated exposure  control, adjustment of the mA and/or kV according to patient size and/or use of iterative reconstruction technique. CONTRAST:  162mL OMNIPAQUE IOHEXOL 300 MG/ML  SOLN COMPARISON:  CT abdomen and pelvis dated January 03, 2022 FINDINGS: Lower chest: Small left pleural effusion and atelectasis. Hepatobiliary: Scattered low-attenuation liver lesions, too small to completely characterize, but likely simple cysts. Gallbladder is unremarkable. No biliary ductal dilation. Pancreas: Unremarkable. No pancreatic ductal dilatation or surrounding inflammatory changes. Spleen: Normal in size without focal abnormality. Adrenals/Urinary Tract: Adrenal glands are unremarkable. Kidneys are normal, without renal calculi, focal lesion, or hydronephrosis. Bladder is unremarkable. Stomach/Bowel: Interval sigmoidectomy with left upper quadrant ostomy. Multiple dilated loops of small bowel are seen. Mild wall thickening of the proximal transverse colon. Vascular/Lymphatic: No significant vascular findings are present. No enlarged abdominal or pelvic lymph nodes. Reproductive: Mild prostatomegaly. Other: Postsurgical changes of the anterior abdominal wall. Small locules of free intraperitoneal air and trace fluid in the left hemiabdomen, likely postsurgical. Musculoskeletal: Diffuse flowing anterior osteophytes, findings can be seen in the setting of diffuse  idiopathic skeletal hyperostosis. No acute or significant osseous findings. IMPRESSION: 1. Interval sigmoidectomy with left upper quadrant ostomy. Small locules of free intraperitoneal air and trace fluid in the left hemiabdomen, likely postsurgical. 2. Multiple dilated loops of small bowel, likely due to ileus. 3. Mild wall thickening of the proximal transverse colon, likely reactive. 4. Small left pleural effusion. Electronically Signed   By: Yetta Glassman M.D.   On: 01/09/2022 17:10   DG Abd Portable 1V  Result Date: 01/09/2022 CLINICAL DATA:  Ileus EXAM: PORTABLE ABDOMEN - 1 VIEW  COMPARISON:  January 07, 2022 FINDINGS: Multiple air-filled dilated small bowel loops are identified in the abdomen and pelvis. There is air identified in the rectal sigmoid colon. Midline surgical staples are identified. Degenerative joint changes of the spine are noted. IMPRESSION: Findings consistent with partial small bowel obstruction or ileus. This is worsened compared prior exam. Electronically Signed   By: Abelardo Diesel M.D.   On: 01/09/2022 08:12   DG Abd 1 View  Result Date: 01/07/2022 CLINICAL DATA:  Encounter for NG tube EXAM: ABDOMEN - 1 VIEW COMPARISON:  CT 01/03/2022. FINDINGS: Nasogastric tube is coiled in the mid chest with tip coursing cephalad outside of the field of view. There is gaseous distention of bowel in the upper abdomen. Midline surgical staples noted. Low lung volumes with elevated right hemidiaphragm and right basilar atelectasis. IMPRESSION: Nasogastric tube coils in the mid chest with tip coursing cephalad outside of the field of view. Recommend repositioning/replacement. Gaseous distension of bowel in the upper abdomen compatible with postoperative ileus. Recommend continued radiographic follow-up. These results will be called to the ordering clinician or representative by the Radiologist Assistant, and communication documented in the PACS or [employer]. Electronically Signed   By: Maurine Simmering M.D.   On: 01/07/2022 13:02   CT Abdomen Pelvis W Contrast  Result Date: 01/03/2022 CLINICAL DATA:  Bowel obstruction suspected EXAM: CT ABDOMEN AND PELVIS WITH CONTRAST TECHNIQUE: Multidetector CT imaging of the abdomen and pelvis was performed using the standard protocol following bolus administration of intravenous contrast. RADIATION DOSE REDUCTION: This exam was performed according to the departmental dose-optimization program which includes automated exposure control, adjustment of the mA and/or kV according to patient size and/or use of iterative reconstruction technique.  CONTRAST:  52mL OMNIPAQUE IOHEXOL 350 MG/ML SOLN COMPARISON:  None Available. FINDINGS: Lower chest: Bibasilar linear opacities, likely atelectasis. Hepatobiliary: Scattered subcentimeter hypodensities in the liver, likely small cysts. Gallbladder unremarkable. Pancreas: No focal abnormality or ductal dilatation. Spleen: No focal abnormality.  Normal size. Adrenals/Urinary Tract: No adrenal abnormality. No focal renal abnormality. No stones or hydronephrosis. Urinary bladder is unremarkable. Stomach/Bowel: Markedly dilated small bowel diffusely. Right colon and transverse colon are dilated to the distal transverse colon/splenic flexure where there is circumferential wall thickening and mucosal enhancement. Favor colitis although cancer cannot be excluded. After this area at the splenic flexure, the distal transverse colon and sigmoid colon again become dilated. There is abrupt decompression of the sigmoid colon best seen on sagittal image 82 and axial image 80. Remainder the rectosigmoid colon are decompressed. This areas concerning for possible tumor/malignancy. Vascular/Lymphatic: No evidence of aneurysm or adenopathy. Reproductive: No visible focal abnormality. Other: No free fluid or free air. Musculoskeletal: No acute bony abnormality. IMPRESSION: 2 areas of strictures in the colon. The 1st is at the splenic flexure with circumferential wall thickening over several cm and mucosal enhancement. This could reflect an area of colitis or malignancy. Second colonic stricture is seen in the  sigmoid colon. This area is concerning for malignancy. Bowel obstruction related to these 2 colonic strictures. Small bowel is diffusely dilated, fluid-filled with air-fluid levels. Right colon and transverse colon are also dilated. Bibasilar atelectasis. Electronically Signed   By: Rolm Baptise M.D.   On: 01/03/2022 22:08    Pathology:  SURGICAL PATHOLOGY  CASE: [id number]  PATIENT: [facility]  Surgical Pathology  Report   Clinical History: large bowel obstruction (cm)   FINAL MICROSCOPIC DIAGNOSIS:   A. LEFT COLON, SIGMOID AND DISTAL TRANSVERSE, PARTIAL COLECTOMY:  Invasive well differentiated adenocarcinoma  Tumor invades to and involves the serosal surface/visceral peritoneum  (pT4a)  Tumor measures 3.5 x 2.0 x 2.0 cm  Margins free  Three of 29 pericolic lymph nodes with metastatic carcinoma (3/29, pN1a)   COMMENT:   Sections show a tumor composed of irregular angulated glands diffusely  infiltrating the submucosa muscularis propria and extending into the  serosa and pericolic adipose.  Focally there appears to be a  pre-existing adenoma with high-grade dysplasia.  Given the unusual  diffuse infiltrative pattern four immunohistochemical stains are  performed with adequate control to confirm a colonic origin.  The tumor  is diffusely and strongly positive for the colonic markers cytokeratin  20 and CDX2.  The tumor is negative for cytokeratin 7 pulmonary adeno  marker TTF-1.   ONCOLOGY TABLE:   COLON AND RECTUM, CARCINOMA:  Resection   Procedure: Partial colectomy  Tumor Site: Sigmoid colon  Tumor Size: 3.5 x 2.0 x 2.0 cm  Macroscopic Tumor Perforation: Not identified  Macroscopic Evaluation of Mesorectum (required for rectal cancer): Not  applicable  Histologic Type: Adenocarcinoma  Histologic Grade: Well differentiated (low-grade)  Multiple Primary Sites: Not applicable  Tumor Extension: Tumor invades to the serosal surface to involve the  visceral peritoneum  Lymphovascular Invasion: Not identified  Perineural Invasion: Not identified  Treatment Effect: No known presurgical therapy  Margins:       Margin Status for Invasive Carcinoma: All margins negative for  invasive carcinoma       Distance from Invasive Carcinoma to Radial (Circumferential) Margin  (required for rectal            tumors): Not applicable (not a rectal tumor)       Distance from Invasive Carcinoma to  Closest Mucosal: 6 cm from  distal margin       Margin Status for Non-Invasive Tumor: All margins negative for  high-grade dysplasia / intramucosal            carcinoma and low-grade dysplasia  Regional Lymph Nodes:       Number of Lymph Nodes with Tumor: 3       Number of Lymph Nodes Examined: 29  Tumor Deposits: Not identified  Distant Metastasis: Not applicable  Pathologic Stage Classification (pTNM, AJCC 8th Edition): pT4a, pN1a  Ancillary Studies: MMR / MSI testing has been ordered and the results to  be issued in an addendum.  Representative Tumor Block: A4  Comments: None   (v4.2.0.1)   GROSS DESCRIPTION:   Specimen: Colon resection, clinically left, including distal transverse  and sigmoid, received fresh.  Specimen integrity: Intact and unopened  Orientation: Suture marks distal.  Specimen length: 78 cm  Mesorectal intactness: Not applicable  Tumor location: Sigmoid, predominantly antimesenteric wall.  Tumor size: 2 cm in length and 3.5 cm in diameter tan-red firm sessile  mass.  Percent of bowel circumference involved: 70%, stenosing the lumen to  less than 1 cm.  Tumor distance  to margins:                       Proximal: 70 cm                       Distal: 6 cm                       Mesenteric (sigmoid and transverse): 3 cm  Macroscopic extent of tumor invasion: Tumor involves full-thickness of  the muscularis, and slightly into underlying fat.  Tumor abuts but does  not grossly involve serosa.  Total presumed lymph nodes: 29 possible lymph nodes which range from 0.1  to 1.3 cm.  The largest is somewhat firm and found within 3 cm of the  tumor.  Extramural satellite tumor nodules: None  Mucosal polyp(s): None  Additional findings: None  Block summary:  Block 1 = proximal margin  Block 2 = distal margin  Block 3 = tumor  Blocks 4, 5 = tumor abutting serosa  Block 6 = tissue for molecular testing  Block 7 = 5 possible nodes  Block 8 = 5 possible nodes   Block 9 = 5 possible nodes  Block 10 = 5 possible nodes  Block 11 = 5 possible nodes  Block 12 = 1 node bisected  Block 13 = 1 node bisected  Block 14 = 1 node bisected  Block 15 = largest node, bisected   SW 01/07/2022   Final Diagnosis performed by Tobin Chad, MD.   Electronically  signed 01/15/2022   Assessment and Plan:  Stage IIIb (pT4a, pN1b) well-differentiated colon adenocarcinoma -01/03/2022 CT abdomen/pelvis with contrast-2 areas of stricture in the colon, bowel obstruction related to these 2 colonic strictures -01/04/2022 CEA was 5.5 -Status post left hemicolectomy with end colostomy 01/04/2022-surgical path shows a well differentiated colon adenocarcinoma, pT4a pN1b, no loss of mismatch repair protein expression -01/15/2022 CT chest without contrast-no worrisome pulmonary nodules to suggest pulmonary metastatic disease, no mediastinal or hilar adenopathy. Microcytic anemia Thrombocytosis, likely reactive Leukocytosis Protein calorie malnutrition 6.  History of alcohol abuse 7.  High output colostomy  Carl Barr has a new diagnosis of colon cancer.  Staging work-up consistent with stage IIIa disease.  Discussed the diagnosis with the patient.  We had initial discussions today regarding adjuvant systemic chemotherapy.  We will consider systemic chemotherapy once his nutritional status improves and wounds have healed.  Chemotherapy will be initiated as an outpatient.  He remains on TPN which will be stopped later today.  He is currently on a soft diet.  Diet is being advanced per general surgery.  Dietitian following.  The patient has a poor social situation.  TOC following and plan right now appears to be long-term care placement.  Recommendations: 1.  Continue postop care per general surgery. 2.  Advance diet per general surgery.  Dietitian continues to follow. 3.  We will arrange outpatient follow-up at the cancer center to discuss systemic treatment  options.  Thank you for this referral.   Carl Bussing, DNP, AGPCNP-BC, AOCNP   Carl Barr was interviewed and examined.  I reviewed the CT images, operative note, and pathology report.  He was admitted with colonic obstruction secondary to sigmoid colon cancer.  He has been diagnosed with stage III colon cancer.  He has a significant chance of developing recurrent colon cancer over the next several years based on the T4 lesion with multiple positive lymph nodes.  I recommend adjuvant chemotherapy to begin when he has recovered from surgery.  He remains hospitalized with a high output ostomy, limited mobility, and healing wound.  Mr. Delaughter reports he has no transportation, so it may be difficult for him to go to the cancer center for treatment.  Outpatient follow-up will be scheduled at the Cancer center for within the next few weeks.  We will consider single agent capecitabine versus FOLFOX depending on his clinical status and availability of transportation.  I was present for greater than 50% of today's visit.  I performed medical decision making.

## 2022-01-18 NOTE — Plan of Care (Signed)
  Problem: Nutrition: Goal: Adequate nutrition will be maintained Outcome: Progressing   Problem: Elimination: Goal: Will not experience complications related to bowel motility Outcome: Progressing   

## 2022-01-18 NOTE — Progress Notes (Signed)
Patient ID: Carl Barr, male   DOB: 09/19/1955, 66 y.o.   MRN: [id number]   Acute Care Surgery Service Progress Note:    Chief Complaint/Subjective: Denies vomiting, no emesis charted. Not ambulating, only getting up to chair.  Objective: Vital signs in last 24 hours: Temp:  [98.2 F (36.8 C)-99.1 F (37.3 C)] 98.3 F (36.8 C) (07/14 0906) Pulse Rate:  [92-100] 92 (07/14 0906) Resp:  [14-16] 16 (07/14 0906) BP: (112-122)/(73-78) 122/73 (07/14 0906) SpO2:  [95 %-100 %] 100 % (07/14 0906) Weight:  [97 kg] 97 kg (07/14 0500) Last BM Date : 01/17/22  Intake/Output from previous day: 07/13 0701 - 07/14 0700 In: 1652.7 [P.O.:360; I.V.:1192.7; IV Piggyback:100] Out: 2150 [Urine:500; Stool:1650] Intake/Output this shift: Total I/O In: 904.2 [I.V.:904.2] Out: 1350 [Urine:800; Stool:550]  General: WD, obese male who is laying in bed in NAD Lungs: Respiratory effort nonlabored Abd: soft, mildly distended, nontender; incision clean and dry, open at inferior aspect, fascia in tact. Colostomy is pink and productive of loose stool.  Lab Results: CBC  Recent Labs    01/17/22 0253 01/18/22 0135  WBC 16.1* 15.4*  HGB 8.6* 9.1*  HCT 26.2* 27.4*  PLT 476* 520*   BMET Recent Labs    01/17/22 0253 01/18/22 0135  NA 133* 133*  K 4.5 4.3  CL 102 102  CO2 24 23  GLUCOSE 118* 101*  BUN 7* 10  CREATININE 0.49* 0.59*  CALCIUM 8.6* 8.6*   LFT    Latest Ref Rng & Units 01/17/2022    2:53 AM 01/14/2022    3:37 AM 01/09/2022   12:50 AM  Hepatic Function  Total Protein 6.5 - 8.1 g/dL 7.2  7.2  6.2   Albumin 3.5 - 5.0 g/dL 2.0  2.0  2.0   AST 15 - 41 U/L 55  18  19   ALT 0 - 44 U/L 66  24  16   Alk Phosphatase 38 - 126 U/L 62  47  39   Total Bilirubin 0.3 - 1.2 mg/dL 0.4  0.5  1.0    PT/INR No results for input(s): "LABPROT", "INR" in the last 72 hours. ABG No results for input(s): "PHART", "HCO3" in the last 72 hours.  Invalid input(s): "PCO2",  "PO2"  Studies/Results:  Anti-infectives: Anti-infectives (From admission, onward)    Start     Dose/Rate Route Frequency Ordered Stop   01/04/22 0830  cefoTEtan (CEFOTAN) 2 g in sodium chloride 0.9 % 100 mL IVPB        2 g 200 mL/hr over 30 Minutes Intravenous  Once 01/04/22 0805 01/04/22 1010       Medications: Scheduled Meds:  Chlorhexidine Gluconate Cloth  6 each Topical Daily   enoxaparin (LOVENOX) injection  40 mg Subcutaneous Q24H   feeding supplement  237 mL Oral BID BM   folic acid  1 mg Oral Daily   multivitamin with minerals  1 tablet Oral Daily   sodium chloride flush  10-40 mL Intracatheter Q12H   thiamine  100 mg Oral Daily   Continuous Infusions:  lactated ringers 50 mL/hr at 01/16/22 2203   methocarbamol (ROBAXIN) IV 1,000 mg (01/18/22 0541)   PRN Meds:.HYDROmorphone (DILAUDID) injection, sodium chloride flush  Assessment/Plan: Patient Active Problem List   Diagnosis Date Noted   Adenocarcinoma of colon ([place]) 01/15/2022   Malnutrition of moderate degree 01/11/2022   Open abdominal wall wound 01/11/2022   Poor social situation 01/10/2022   Hypomagnesemia 01/09/2022   Hypophosphatemia 01/09/2022   Alcohol  use 01/04/2022   Hypokalemia 01/04/2022   Large bowel obstruction ([place]) 01/03/2022   Lumbar degenerative disc disease 12/16/2018   Chronic pain of left knee 12/16/2018   Encounter for long-term (current) use of medications 12/16/2018   s/p Procedure(s): LEFT SIGMOID AND DISTAL TRANSVERSE COLECTOMY COLOSTOMY 01/04/2022  Colonic obstruction secondary to sigmoid mass and thickening of splenic flexure POD#14 S/P open extended left hemicolectomy with end colostomy - Surgical pathology shows a well-differentiated colon adenocarcinoma, stage pT4aN1a. Chest CT shows no evidence of metastatic disease. Will need chemotherapy once recovered, medical oncology saw patient yesterday. - CT 7/5: with ileus but no abscess or leak noted, mild wall thickening of  transverse colon - Large volume stool from ostomy: Output has been stable at about 1.5L per day. Patient is mildly distended today. Do not recommend immodium given prior ileus and ongoing distension. Continue to monitor. - Continue soft diet, TPN off - Mobilize, PT following. Have discussed multiple times with patient the importance of mobility. - Leukocytosis: likely secondary to asplenia, continues to improve. - FEN: Soft diet VTE: LMWH Dispo: SNF placement    LOS: 15 days   Michaelle Birks, MD [facility] Surgery General, Hepatobiliary and Pancreatic Surgery 01/18/22 9:06 AM

## 2022-01-18 NOTE — Progress Notes (Addendum)
Physical Therapy Treatment Patient Details Name: Carl Barr MRN: [id number] DOB: 1956/04/17 Today's Date: 01/18/2022   History of Present Illness 66 year old male without any past medical history presents to the ER with complaints of constipation with nausea and vomiting.  Found to have bowel obstruction with colonic stricture concerning for possible malignancy.  S/P Left sigmoid and distal transverse colectomy and colostomy creation on 6/30.  7/3, developed ileus with nausea and vomiting.  Continues to have output from the colostomy bag.  Unable to insert NG x2.    PT Comments    Pt instructed in and performed therapeutic exercises and activities. Pt able to transfer from bed to chair with CGA but needed moderate assistance to stand from bed and 3 attempts required with RW. Progress as tolerated.  Recommendations for follow up therapy are one component of a multi-disciplinary discharge planning process, led by the attending physician.  Recommendations may be updated based on patient status, additional functional criteria and insurance authorization.  Follow Up Recommendations  Skilled nursing-short term rehab (<3 hours/day) Can patient physically be transported by private vehicle: Yes   Assistance Recommended at Discharge Intermittent Supervision/Assistance  Patient can return home with the following A little help with bathing/dressing/bathroom;Assistance with cooking/housework;Direct supervision/assist for medications management;Assist for transportation;Help with stairs or ramp for entrance;A lot of help with walking and/or transfers   Equipment Recommendations  Rolling walker (2 wheels);BSC/3in1    Recommendations for Other Services       Precautions / Restrictions Precautions Precautions: Fall Precaution Comments: colostomy Restrictions Weight Bearing Restrictions: No     Mobility  Bed Mobility Overal bed mobility: Needs Assistance Bed Mobility: Supine to Sit, Rolling,  Sidelying to Sit Rolling: Min assist Sidelying to sit: Min assist Supine to sit: Min assist     General bed mobility comments: Pt required increased time to complete supine to sit and used bed rail on R when rolling to R side to sit up at EOB. Chuck pud utilized as well.    Transfers Overall transfer level: Needs assistance Equipment used: Rolling walker (2 wheels) Transfers: Sit to/from Stand Sit to Stand: Mod assist, From elevated surface   Step pivot transfers: Min guard       General transfer comment: Pt required total of three sit to stand attempts 2/2 LE weakness. Each attempt bed was elevated further. Chuck pad utilized to power up. Pt required cues for sequencing with RW during step pivot transfer.    Ambulation/Gait                   Stairs             Wheelchair Mobility    Modified Rankin (Stroke Patients Only)       Balance Overall balance assessment: Needs assistance Sitting-balance support: No upper extremity supported, Feet supported Sitting balance-Leahy Scale: Fair     Standing balance support: Bilateral upper extremity supported Standing balance-Leahy Scale: Poor Standing balance comment: relies on UE support                            Cognition Arousal/Alertness: Awake/alert Behavior During Therapy: Flat affect Overall Cognitive Status: Impaired/Different from baseline Area of Impairment: Following commands, Safety/judgement, Problem solving                       Following Commands: Follows one step commands with increased time Safety/Judgement: Decreased awareness of safety, Decreased awareness of deficits  Problem Solving: Difficulty sequencing, Requires verbal cues, Decreased initiation, Slow processing General Comments: Pt with difficulty making his needs clear.        Exercises General Exercises - Lower Extremity Ankle Circles/Pumps: AROM, Strengthening, Both, 10 reps, Supine Heel Slides:  Strengthening, Both, Supine, AROM, 10 reps Straight Leg Raises: AROM, Both, 10 reps, Supine    General Comments        Pertinent Vitals/Pain Pain Assessment Pain Assessment: 0-10 Pain Score:  (pt reports "little bit" of pain but unable to quantify) Pain Location: stomach Pain Descriptors / Indicators: Discomfort, Guarding Pain Intervention(s): Limited activity within patient's tolerance, Monitored during session    Home Living                          Prior Function            PT Goals (current goals can now be found in the care plan section) Acute Rehab PT Goals Patient Stated Goal: to go home PT Goal Formulation: With patient Time For Goal Achievement: 01/22/22 Potential to Achieve Goals: Good Progress towards PT goals: Progressing toward goals (slow)    Frequency    Min 3X/week      PT Plan Current plan remains appropriate    Co-evaluation              AM-PAC PT "6 Clicks" Mobility   Outcome Measure  Help needed turning from your back to your side while in a flat bed without using bedrails?: A Little Help needed moving from lying on your back to sitting on the side of a flat bed without using bedrails?: A Little Help needed moving to and from a bed to a chair (including a wheelchair)?: A Lot Help needed standing up from a chair using your arms (e.g., wheelchair or bedside chair)?: A Lot Help needed to walk in hospital room?: A Lot Help needed climbing 3-5 steps with a railing? : Total 6 Click Score: 13    End of Session   Activity Tolerance: Patient limited by fatigue;Patient limited by pain Patient left: with call bell/phone within reach;in chair;with chair alarm set Nurse Communication: Mobility status PT Visit Diagnosis: Unsteadiness on feet (R26.81);Muscle weakness (generalized) (M62.81)     Time: 4008-6761 PT Time Calculation (min) (ACUTE ONLY): 17 min  Charges:  $Therapeutic Activity: 8-22 mins                     Donna Bernard, PT    [facility] 01/18/2022, 2:05 PM

## 2022-01-18 NOTE — TOC Progression Note (Signed)
Transition of Care [facility]) - Progression Note    Patient Details  Name: Carl Barr MRN: [id number] Date of Birth: 09/13/1955  Transition of Care [facility]) CM/SW [place], [place] Phone Number: 01/18/2022, 10:23 AM  Clinical Narrative:    CSW spoke with pt about the importance of working with PT and not refusing to be able to go to SNF. Pt agreed to work with PT and is still agreeable to LTC.   Expected Discharge Plan: [place] Barriers to Discharge: Continued Medical Work up, SNF Pending bed offer  Expected Discharge Plan and Services Expected Discharge Plan: [place] In-house Referral: Clinical Social Work Discharge Planning Services: CM Consult Post Acute Care Choice: [place] arrangements for the past 2 months: [place]                   DME Agency:  (Await PT eval)       HH Arranged: Therapist, sports (Await PT eval) [place] Agency: [place]         Social Determinants of Health (SDOH) Interventions    Readmission Risk Interventions     No data to display

## 2022-01-18 NOTE — Progress Notes (Signed)
Mobility Specialist Criteria Algorithm Info.   01/18/22 1638  Mobility  Activity Ambulated with assistance in room;Stood at bedside (STS x2. LE exercises.)  Range of Motion/Exercises Active;All extremities  Level of Assistance Minimal assist, patient does 75% or more  Assistive Device Front wheel walker  Distance Ambulated (ft) 10 ft  Activity Response Tolerated well   Patient received in recliner chair agreeable to participate in mobility. Completed sit>stand x2 and LE exercises before ambulating short distance room. Required minimal HHA + cues for hand placement.Tolerated without complaint or incident. Was left in recliner with all needs met, call bell in reach.   01/18/2022 4:40 PM  [place] Diane Hanel, [place], [place]  ATFTD:[phone] Office: [phone]

## 2022-01-18 NOTE — Progress Notes (Signed)
Progress Note Patient: Carl Barr GGY:[id number] DOB: 09/30/1955 DOA: 01/03/2022  DOS: the patient was seen and examined on 01/18/2022  Brief hospital course: Mr. Loftus is a 66 yo male with PMH lumbar DDD who presented with N/V and inability to pass gas/bowel movements.  CT abdomen/pelvis on admission noted 2 areas of stricture in the colon involving the splenic flexure and sigmoid colon.  Area was concerning for malignancy.  He was evaluated by general surgery.  He underwent left sigmoid and distal transverse colectomy with creation of colostomy on 01/04/2022.  He then developed a postop ileus with ongoing high output.  Assessment and Plan: Malignant large bowel obstruction  S/P open extended left hemicolectomy with end colostomy CT abdomen/pelvis on admission noted 2 areas of stricture in the colon involving the splenic flexure and sigmoid colon. Underwent sigmoid and distal transverse colectomy with creation of colostomy on 01/04/2022 PICC line placed on 01/11/2022 patient was on TPN which was discontinued on 7/13.   Adenocarcinoma of colon [facility]) Pathology has returned with well differentiated adenocarcinoma;  pericolic lymph nodes also noted with the same Oncology consulted, seen by Dr. Benay Spice.  Outpatient follow-up recommended.   High output colostomy. Patient actually has a colostomy but his output is significantly high which is causing multiple electrolytes abnormalities. Anticipating improvement over time Currently not a good candidate for starting on Imodium or similar medication, as patient did have early ileus postoperatively and continues to remain at high risk. C. difficile negative on 01/13/2022 Output is now getting thicker on diet.  Poor social situation Patient lives at home with his cousin.  He has very poor health literacy at baseline; with his new colostomy, there is concern for patient being able to handle this at time of discharge.   patient needs ongoing ostomy  education while inpatient as he's anticipated to go home after rehab unless he transitions into long term care  Open abdominal wall wound Earlier in the hospital, patient had further bleeding from lower abdominal incision,  underwent staple removal of lower incision and started on BID dressing changes with saline moistened gauze per surgery currently resolved.  Malnutrition of moderate degree Body mass index is 31.58 kg/m. Nutrition Problem: Moderate Malnutrition Etiology: social / environmental circumstances (etoh abuse) - seen by RD, appreciate assistance.  TPN currently discontinued as of 7/13. -Monitor for improvement in oral intake   Hypokalemia - Replete as needed   Hypophosphatemia - Replete as needed   Hypomagnesemia - Replete as needed    Alcohol use - No further concern for any withdrawal given prolonged hospitalization   Class I obesity. Body mass index is 31.58 kg/m.  Placing the pt at higher risk of poor outcomes.  Subjective: Denies any acute complaints.  No nausea or vomiting.  1.5 L output from the colostomy yesterday.  Physical Exam: Vitals:   01/18/22 0400 01/18/22 0500 01/18/22 0906 01/18/22 1607  BP: 120/75  122/73 111/80  Pulse: 93  92 97  Resp: '16  16 16  '$ Temp: 98.6 F (37 C)  98.3 F (36.8 C) 98.2 F (36.8 C)  TempSrc: Oral  Oral Oral  SpO2: 98%  100% 94%  Weight:  97 kg    Height:       General: Appear in mild distress; no visible Abnormal Neck Mass Or lumps, Conjunctiva normal Cardiovascular: S1 and S2 Present, no Murmur, Respiratory: good respiratory effort, Bilateral Air entry present and CTA, no Crackles, no wheezes Abdomen: Bowel Sound present, Non tender  Extremities: no Pedal edema  Neurology: alert and oriented to time, place, and person  Gait not checked due to patient safety concerns   Data Reviewed: I have Reviewed nursing notes, Vitals, and Lab results since pt's last encounter. Pertinent lab results CBC and BMP I have  ordered test including CBC and BMP    Family Communication: None at bedside  Disposition: Status is: Inpatient Remains inpatient appropriate because: Still having significant output from the colostomy on IV fluids.  Need to stabilize without any supportive measures.  Long-term facility will not be able to take the patient over the weekend.  Author: Berle Mull, MD 01/18/2022 4:23 PM  Please look on [email or web address] to find out who is on call.

## 2022-01-19 DIAGNOSIS — E440 Moderate protein-calorie malnutrition: Secondary | ICD-10-CM

## 2022-01-19 DIAGNOSIS — K56609 Unspecified intestinal obstruction, unspecified as to partial versus complete obstruction: Secondary | ICD-10-CM | POA: Diagnosis not present

## 2022-01-19 DIAGNOSIS — C189 Malignant neoplasm of colon, unspecified: Secondary | ICD-10-CM | POA: Diagnosis not present

## 2022-01-19 DIAGNOSIS — E876 Hypokalemia: Secondary | ICD-10-CM | POA: Diagnosis not present

## 2022-01-19 DIAGNOSIS — Z789 Other specified health status: Secondary | ICD-10-CM

## 2022-01-19 LAB — CBC
HCT: 26.6 % — ABNORMAL LOW (ref 39.0–52.0)
Hemoglobin: 8.7 g/dL — ABNORMAL LOW (ref 13.0–17.0)
MCH: 25.2 pg — ABNORMAL LOW (ref 26.0–34.0)
MCHC: 32.7 g/dL (ref 30.0–36.0)
MCV: 77.1 fL — ABNORMAL LOW (ref 80.0–100.0)
Platelets: 496 10*3/uL — ABNORMAL HIGH (ref 150–400)
RBC: 3.45 MIL/uL — ABNORMAL LOW (ref 4.22–5.81)
RDW: 18.6 % — ABNORMAL HIGH (ref 11.5–15.5)
WBC: 13.1 10*3/uL — ABNORMAL HIGH (ref 4.0–10.5)
nRBC: 0 % (ref 0.0–0.2)

## 2022-01-19 LAB — BASIC METABOLIC PANEL
Anion gap: 10 (ref 5–15)
BUN: 7 mg/dL — ABNORMAL LOW (ref 8–23)
CO2: 22 mmol/L (ref 22–32)
Calcium: 8.7 mg/dL — ABNORMAL LOW (ref 8.9–10.3)
Chloride: 100 mmol/L (ref 98–111)
Creatinine, Ser: 0.58 mg/dL — ABNORMAL LOW (ref 0.61–1.24)
GFR, Estimated: >60 mL/min (ref >60)
Glucose, Bld: 107 mg/dL — ABNORMAL HIGH (ref 70–99)
Potassium: 3.7 mmol/L (ref 3.5–5.1)
Sodium: 132 mmol/L — ABNORMAL LOW (ref 135–145)

## 2022-01-19 NOTE — Progress Notes (Signed)
Patient ID: Carl Barr, male   DOB: 1956-05-16, 66 y.o.   MRN: [id number] [facility] Surgery Progress Note:   15 Days Post-Op  Subjective: Mental status is sluggish;  not a great historian.  Complaints none. Objective: Vital signs in last 24 hours: Temp:  [98.2 F (36.8 C)-98.8 F (37.1 C)] 98.6 F (37 C) (07/15 7169) Pulse Rate:  [92-97] 94 (07/15 0608) Resp:  [16-22] 22 (07/15 0608) BP: (111-126)/(70-91) 123/91 (07/15 0608) SpO2:  [94 %-100 %] 98 % (07/15 6789) Weight:  [95.6 kg] 95.6 kg (07/15 0500)  Intake/Output from previous day: 07/14 0701 - 07/15 0700 In: 2295.2 [P.O.:600; I.V.:1595.2; IV Piggyback:100] Out: 4100 [Urine:1450; Stool:2650] Intake/Output this shift: No intake/output data recorded.  Physical Exam: Work of breathing is not labored.  Upper portion of incision with staples and lower portion is open with drainage.  Condom cath in place  Lab Results:  Results for orders placed or performed during the hospital encounter of 01/03/22 (from the past 48 hour(s))  Basic metabolic panel     Status: Abnormal   Collection Time: 01/18/22  1:35 AM  Result Value Ref Range   Sodium 133 (L) 135 - 145 mmol/L   Potassium 4.3 3.5 - 5.1 mmol/L   Chloride 102 98 - 111 mmol/L   CO2 23 22 - 32 mmol/L   Glucose, Bld 101 (H) 70 - 99 mg/dL    Comment: Glucose reference range applies only to samples taken after fasting for at least 8 hours.   BUN 10 8 - 23 mg/dL   Creatinine, Ser 0.59 (L) 0.61 - 1.24 mg/dL   Calcium 8.6 (L) 8.9 - 10.3 mg/dL   GFR, Estimated >60 >60 mL/min    Comment: (NOTE) Calculated using the CKD-EPI Creatinine Equation (2021)    Anion gap 8 5 - 15    Comment: Performed at [facility] [address], [place], [place] [address]  CBC     Status: Abnormal   Collection Time: 01/18/22  1:35 AM  Result Value Ref Range   WBC 15.4 (H) 4.0 - 10.5 K/uL   RBC 3.68 (L) 4.22 - 5.81 MIL/uL   Hemoglobin 9.1 (L) 13.0 - 17.0 g/dL   HCT 27.4 (L) 39.0 - 52.0  %   MCV 74.5 (L) 80.0 - 100.0 fL   MCH 24.7 (L) 26.0 - 34.0 pg   MCHC 33.2 30.0 - 36.0 g/dL   RDW 18.6 (H) 11.5 - 15.5 %   Platelets 520 (H) 150 - 400 K/uL   nRBC 0.1 0.0 - 0.2 %    Comment: Performed at [facility] [address] [place],  [address]  Basic metabolic panel     Status: Abnormal   Collection Time: 01/19/22  3:08 AM  Result Value Ref Range   Sodium 132 (L) 135 - 145 mmol/L   Potassium 3.7 3.5 - 5.1 mmol/L   Chloride 100 98 - 111 mmol/L   CO2 22 22 - 32 mmol/L   Glucose, Bld 107 (H) 70 - 99 mg/dL    Comment: Glucose reference range applies only to samples taken after fasting for at least 8 hours.   BUN 7 (L) 8 - 23 mg/dL   Creatinine, Ser 0.58 (L) 0.61 - 1.24 mg/dL   Calcium 8.7 (L) 8.9 - 10.3 mg/dL   GFR, Estimated >60 >60 mL/min    Comment: (NOTE) Calculated using the CKD-EPI Creatinine Equation (2021)    Anion gap 10 5 - 15  Comment: Performed at [facility], [place] [address] [place], [place] [address]  CBC     Status: Abnormal   Collection Time: 01/19/22  3:08 AM  Result Value Ref Range   WBC 13.1 (H) 4.0 - 10.5 K/uL   RBC 3.45 (L) 4.22 - 5.81 MIL/uL   Hemoglobin 8.7 (L) 13.0 - 17.0 g/dL    Comment: Reticulocyte Hemoglobin testing may be clinically indicated, consider ordering this additional test [id number]    HCT 26.6 (L) 39.0 - 52.0 %   MCV 77.1 (L) 80.0 - 100.0 fL   MCH 25.2 (L) 26.0 - 34.0 pg   MCHC 32.7 30.0 - 36.0 g/dL   RDW 18.6 (H) 11.5 - 15.5 %   Platelets 496 (H) 150 - 400 K/uL   nRBC 0.0 0.0 - 0.2 %    Comment: Performed at [facility], [place] [address], [place], [place] [address]    Radiology/Results: No results found.  Anti-infectives: Anti-infectives (From admission, onward)    Start     Dose/Rate Route Frequency Ordered Stop   01/04/22 0830  cefoTEtan (CEFOTAN) 2 g in sodium chloride 0.9 % 100 mL IVPB        2 g 200 mL/hr over 30 Minutes Intravenous  Once 01/04/22 0805 01/04/22 1010        Assessment/Plan: Problem List: Patient Active Problem List   Diagnosis Date Noted   Adenocarcinoma of colon ([place]) 01/15/2022   Malnutrition of moderate degree 01/11/2022   Open abdominal wall wound 01/11/2022   Poor social situation 01/10/2022   Hypomagnesemia 01/09/2022   Hypophosphatemia 01/09/2022   Alcohol use 01/04/2022   Hypokalemia 01/04/2022   Large bowel obstruction ([place]) 01/03/2022   Lumbar degenerative disc disease 12/16/2018   Chronic pain of left knee 12/16/2018   Encounter for long-term (current) use of medications 12/16/2018    Post resection of colon cancer with ostomy and lower part of incision is open.  Patient lacks motivation to do for himself and that is contributing to his slow recovery.  Not ready for discharge yet.   15 Days Post-Op    LOS: 16 days   Matt B. Hassell Done, MD, [facility] [phone] to reach the surgeon on call.    01/19/2022 7:46 AM

## 2022-01-19 NOTE — Progress Notes (Signed)
PROGRESS NOTE    Carl Barr  IWP:[id number] DOB: 12-Nov-1955 DOA: 01/03/2022 PCP: Antony Blackbird, MD    Brief Narrative:  Carl Barr is a 66 yo male with past medical history of lumbar degenerative joint disease presented to the hospital with nausea vomiting and obstipation.  CT scan of the abdomen and pelvis showed 2 areas of stricture in the colon involving the splenic flexure and sigmoid colon which was concerning for malignancy.  Patient was then seen by general surgery and underwent left sigmoid and distal transverse colectomy with creation of colostomy on 01/04/2022.  Postoperatively, patient developed ileus with ongoing high output and was followed by general surgery during hospitalization.  At this time, general surgery still following and patient is still not ready for discharge.  Assessment and Plan: Principal Problem:   Large bowel obstruction (H[facility]) Active Problems:   Open abdominal wall wound   Hypokalemia   Poor social situation   Adenocarcinoma of colon (H[facility])   Alcohol use   Hypomagnesemia   Hypophosphatemia   Malnutrition of moderate degree   Malignant large bowel obstruction S/P open extended left hemicolectomy with end colostomy PICC line on 01/11/2022 and was briefly on TPN which has been discontinued 01/17/2022.  Patient has started eating.  Currently with high output fistula.  General surgery following.  Adenocarcinoma of colon [facility]) Pathology with well differentiated adenocarcinoma;  pericolic lymph nodes also noted with the same. Oncology consulted, seen by Dr. Benay Spice.  Outpatient follow-up recommended at this time.   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  General surgery following.  Still not ready.  CT was negative.  Output is getting thicker at this time.  Postoperatively patient had some ileus.  On oral diet at this time   Poor social situation Patient lives at home with his cousin.  Poor health literacy.  Will need ostomy  education while inpatient.   Open abdominal wall wound with bleeding. Improved at this time.   Malnutrition of moderate degree Present on admission.  Body mass index is 31.58 kg/m. Seen by nutrition services.  Was briefly on TPN initially.  Has been started on oral diet.   Hypokalemia Latest potassium of 3.7.  Improved.   Hypophosphatemia Improved after replacement.  Latest phosphate of four-point   Hypomagnesemia Improved.  Latest magnesium of 2.0.   Alcohol use - No further concern for any withdrawal given prolonged hospitalization   Class I obesity. Body mass index is 31.58 kg/m.  Would benefit from weight loss as outpatient.     DVT prophylaxis: enoxaparin (LOVENOX) injection 40 mg Start: 01/07/22 1130 SCDs Start: 01/04/22 0616   Code Status:     Code Status: Full Code  Disposition: Home facility.  Status is: Inpatient  Remains inpatient appropriate because: High output colostomy, awaiting for placement.   Family Communication: None at bedside  Consultants:  General surgery  Procedures:  Open extended left hemicolectomy with colostomy PICC line placement  Antimicrobials:  None  Anti-infectives (From admission, onward)    Start     Dose/Rate Route Frequency Ordered Stop   01/04/22 0830  cefoTEtan (CEFOTAN) 2 g in sodium chloride 0.9 % 100 mL IVPB        2 g 200 mL/hr over 30 Minutes Intravenous  Once 01/04/22 0805 01/04/22 1010      Subjective: Today, patient was seen and examined at bedside.  Patient is a poor historian.  Denies any nausea vomiting abdominal pain.  Has been having bowel movements.  Objective: Vitals:  01/18/22 2014 01/19/22 0500 01/19/22 0608 01/19/22 0756  BP: 126/70  (!) 123/91 104/78  Pulse: 95  94 96  Resp: 20  (!) 22 17  Temp: 98.8 F (37.1 C)  98.6 F (37 C) 98.2 F (36.8 C)  TempSrc: Oral  Oral Oral  SpO2: 99%  98% 100%  Weight:  95.6 kg    Height:        Intake/Output Summary (Last 24 hours) at 01/19/2022  1020 Last data filed at 01/19/2022 0904 Gross per 24 hour  Intake 1421.03 ml  Output 2800 ml  Net -1378.97 ml   Filed Weights   01/17/22 0427 01/18/22 0500 01/19/22 0500  Weight: 98.4 kg 97 kg 95.6 kg    Physical Examination: Body mass index is 31.12 kg/m.   General: Obese built, not in obvious distress HENT:   No scleral pallor or icterus noted. Oral mucosa is moist.  Chest:  Clear breath sounds.  Diminished breath sounds bilaterally. No crackles or wheezes.  CVS: S1 &S2 heard. No murmur.  Regular rate and rhythm. Abdomen: Soft, nontender,  Bowel sounds are heard.  Colostomy bag in place.  Midline scar with staples. Extremities: No cyanosis, clubbing or edema.  Peripheral pulses are palpable. Psych: Alert, awake and oriented, normal mood CNS:  No cranial nerve deficits.  Power equal in all extremities.   Skin: Warm and dry.  No rashes noted.  Data Reviewed:   CBC: Recent Labs  Lab 01/13/22 0341 01/14/22 0337 01/16/22 0336 01/17/22 0253 01/18/22 0135 01/19/22 0308  WBC 20.0* 27.7* 17.8* 16.1* 15.4* 13.1*  NEUTROABS 14.6* 20.2*  --   --   --   --   HGB 10.9* 9.9* 8.4* 8.6* 9.1* 8.7*  HCT 32.6* 29.5* 25.8* 26.2* 27.4* 26.6*  MCV 74.6* 75.4* 77.0* 77.3* 74.5* 77.1*  PLT 496* 443* 461* 476* 520* 496*    Basic Metabolic Panel: Recent Labs  Lab 01/12/22 2157 01/13/22 0341 01/13/22 2259 01/14/22 0337 01/15/22 0400 01/16/22 0336 01/17/22 0253 01/18/22 0135 01/19/22 0308  NA 136 141 136 136 134* 135 133* 133* 132*  K 3.3* 4.3 4.1 4.1 4.6 4.7 4.5 4.3 3.7  CL 104 105 101 103 104 103 102 102 100  CO2 21* '23 25 24 25 24 24 23 22  '$ GLUCOSE 170* 149* 141* 132* 126* 129* 118* 101* 107*  BUN 25* 24* '20 17 11 9 '$ 7* 10 7*  CREATININE 0.70 0.66 0.55* 0.51* 0.44* 0.39* 0.49* 0.59* 0.58*  CALCIUM 8.7* 9.2 8.6* 8.5* 8.3* 8.4* 8.6* 8.6* 8.7*  MG 2.3 2.2 2.4 2.4  --   --  2.0  --   --   PHOS 2.5 2.4* 3.2 3.0 3.6 3.8 4.2  --   --     Liver Function Tests: Recent Labs  Lab  01/14/22 0337 01/17/22 0253  AST 18 55*  ALT 24 66*  ALKPHOS 47 62  BILITOT 0.5 0.4  PROT 7.2 7.2  ALBUMIN 2.0* 2.0*     Radiology Studies: No results found.    LOS: 16 days    Flora Lipps, MD [facility] Available via Epic secure chat 7am-7pm After these hours, please refer to coverage provider listed on [email or web address] 01/19/2022, 10:20 AM

## 2022-01-19 NOTE — Progress Notes (Signed)
Mobility Specialist Progress Note:   01/19/22 1010  Mobility  Activity  (bed level exercises)  Range of Motion/Exercises Active;All extremities  Level of Assistance Independent  Assistive Device None  Activity Response Tolerated well  $Mobility charge 1 Mobility   Pt adamantly refusing any OOB mobility this am. Performed minimal bed level exercises. Tolerated well. Pt continuously states things like "I want to take my time. Im not ready. I've been over there already". Encouraged OOB mobility often, pt states understanding. Left with all needs met, MD in room.  Nelta Numbers Acute Rehab Secure Chat or Office Phone: [phone]

## 2022-01-20 DIAGNOSIS — E876 Hypokalemia: Secondary | ICD-10-CM | POA: Diagnosis not present

## 2022-01-20 DIAGNOSIS — K56609 Unspecified intestinal obstruction, unspecified as to partial versus complete obstruction: Secondary | ICD-10-CM | POA: Diagnosis not present

## 2022-01-20 DIAGNOSIS — C189 Malignant neoplasm of colon, unspecified: Secondary | ICD-10-CM | POA: Diagnosis not present

## 2022-01-20 DIAGNOSIS — Z789 Other specified health status: Secondary | ICD-10-CM | POA: Diagnosis not present

## 2022-01-20 LAB — COMPREHENSIVE METABOLIC PANEL
ALT: 41 U/L (ref 0–44)
AST: 22 U/L (ref 15–41)
Albumin: 2 g/dL — ABNORMAL LOW (ref 3.5–5.0)
Alkaline Phosphatase: 58 U/L (ref 38–126)
Anion gap: 5 (ref 5–15)
BUN: 6 mg/dL — ABNORMAL LOW (ref 8–23)
CO2: 23 mmol/L (ref 22–32)
Calcium: 8.7 mg/dL — ABNORMAL LOW (ref 8.9–10.3)
Chloride: 103 mmol/L (ref 98–111)
Creatinine, Ser: 0.49 mg/dL — ABNORMAL LOW (ref 0.61–1.24)
GFR, Estimated: >60 mL/min (ref >60)
Glucose, Bld: 100 mg/dL — ABNORMAL HIGH (ref 70–99)
Potassium: 3.8 mmol/L (ref 3.5–5.1)
Sodium: 131 mmol/L — ABNORMAL LOW (ref 135–145)
Total Bilirubin: 0.4 mg/dL (ref 0.3–1.2)
Total Protein: 7.3 g/dL (ref 6.5–8.1)

## 2022-01-20 LAB — CBC
HCT: 27.6 % — ABNORMAL LOW (ref 39.0–52.0)
Hemoglobin: 8.9 g/dL — ABNORMAL LOW (ref 13.0–17.0)
MCH: 25.2 pg — ABNORMAL LOW (ref 26.0–34.0)
MCHC: 32.2 g/dL (ref 30.0–36.0)
MCV: 78.2 fL — ABNORMAL LOW (ref 80.0–100.0)
Platelets: 496 10*3/uL — ABNORMAL HIGH (ref 150–400)
RBC: 3.53 MIL/uL — ABNORMAL LOW (ref 4.22–5.81)
RDW: 18.3 % — ABNORMAL HIGH (ref 11.5–15.5)
WBC: 12.1 10*3/uL — ABNORMAL HIGH (ref 4.0–10.5)
nRBC: 0 % (ref 0.0–0.2)

## 2022-01-20 LAB — MAGNESIUM: Magnesium: 2 mg/dL (ref 1.7–2.4)

## 2022-01-20 LAB — PHOSPHORUS: Phosphorus: 3.7 mg/dL (ref 2.5–4.6)

## 2022-01-20 NOTE — Progress Notes (Signed)
PROGRESS NOTE    Carl Barr  FTD:[id number] DOB: 08/08/1955 DOA: 01/03/2022 PCP: Antony Blackbird, MD    Brief Narrative:  Carl Barr is a 66 yo male with past medical history of lumbar degenerative joint disease presented to the hospital with nausea, vomiting and obstipation.  CT scan of the abdomen and pelvis showed 2 areas of stricture in the colon involving the splenic flexure and sigmoid colon which was concerning for malignancy.  Patient was then seen by general surgery and underwent left sigmoid and distal transverse colectomy with creation of colostomy on 01/04/2022.  Postoperatively, patient developed ileus with ongoing high output and was followed by general surgery during hospitalization.  At this time, general surgery still following and patient is still not ready for discharge.  Assessment and Plan: Principal Problem:   Large bowel obstruction (HCC) Active Problems:   Open abdominal wall wound   Hypokalemia   Poor social situation   Adenocarcinoma of colon (HCC)   Alcohol use   Hypomagnesemia   Hypophosphatemia   Malnutrition of moderate degree    Malignant large bowel obstruction S/P open extended left hemicolectomy with end colostomy Status post PICC line on 01/11/2022 and was briefly on TPN which has been discontinued 01/17/2022.  Patient has started eating still with poor oral intake.  Surgery has seen the patient today and recommend calorie count and encourage oral nutrition.  Currently with high output fistula.  General surgery following.  Surgery planning to remove the residual staples today.  Adenocarcinoma of colon [facility]) Pathology with well differentiated adenocarcinoma;  pericolic lymph nodes also noted with the same. Oncology consulted, seen by Dr. Benay Spice.  Outpatient follow-up recommended at this time.   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  General surgery following.  Still not ready.  CT was negative.  Output is getting thicker at  this time.  Postoperatively patient had some ileus.  On oral diet at this time   Poor social situation Patient lives at home with his cousin.  Poor health literacy.  Will need ostomy education while inpatient.   Open abdominal wall wound with bleeding. Improved at this time.  Surgery following.  Plan for removing residual staples   Malnutrition of moderate degree Present on admission.  Body mass index is 31.58 kg/m. Seen by nutrition services.  Was briefly on TPN initially.  Has been started on oral diet.   Hypokalemia Latest potassium of 3.8.  Improved.   Hypophosphatemia Improved after replacement.  Latest phosphate of f3.7.   Hypomagnesemia Improved.  Latest magnesium of 2.0.  Hyponatremia.  Mild.   Alcohol use - No further concern for any withdrawal given prolonged hospitalization   Class I obesity. Body mass index is 31.58 kg/m.  Would benefit from weight loss as outpatient.     DVT prophylaxis: enoxaparin (LOVENOX) injection 40 mg Start: 01/07/22 1130 SCDs Start: 01/04/22 0616   Code Status:     Code Status: Full Code  Disposition: Skilled nursing facility.  Status is: Inpatient  Remains inpatient appropriate because: High output colostomy, awaiting for placement.   Family Communication:  None at bedside  Consultants:  General surgery  Procedures:  Open extended left hemicolectomy with colostomy PICC line placement  Antimicrobials:  None  Anti-infectives (From admission, onward)    Start     Dose/Rate Route Frequency Ordered Stop   01/04/22 0830  cefoTEtan (CEFOTAN) 2 g in sodium chloride 0.9 % 100 mL IVPB        2 g 200 mL/hr over  30 Minutes Intravenous  Once 01/04/22 0805 01/04/22 1010      Subjective: Today, patient was seen and examined at bedside.  Patient is a poor historian.  Denies any nausea vomiting abdominal pain.  Has been having output in the colostomy.  Objective: Vitals:   01/19/22 1747 01/19/22 2115 01/20/22 0633 01/20/22  0850  BP: 118/70 121/72 130/81 111/79  Pulse: 92 93 86 88  Resp: '18 18 18 18  '$ Temp: 98.1 F (36.7 C) 99 F (37.2 C) 98.2 F (36.8 C) 98 F (36.7 C)  TempSrc: Oral Oral Oral Oral  SpO2: 98% 99% 100% 100%  Weight:   94.9 kg   Height:        Intake/Output Summary (Last 24 hours) at 01/20/2022 1051 Last data filed at 01/20/2022 1049 Gross per 24 hour  Intake 1051.77 ml  Output 3450 ml  Net -2398.23 ml    Filed Weights   01/18/22 0500 01/19/22 0500 01/20/22 0633  Weight: 97 kg 95.6 kg 94.9 kg    Physical Examination: Body mass index is 30.9 kg/m.   General: Obese built, not in obvious distress HENT:   No scleral pallor or icterus noted. Oral mucosa is moist.  Chest:  Clear breath sounds.  Diminished breath sounds bilaterally. No crackles or wheezes.  CVS: S1 &S2 heard. No murmur.  Regular rate and rhythm. Abdomen: Soft, nontender, nondistended.  Bowel sounds are heard.  Abdominal scar with staples.  Colostomy bag in place Extremities: No cyanosis, clubbing or edema.  Peripheral pulses are palpable. Psych: Alert, awake and communicative, poor insight. CNS:  No cranial nerve deficits.  Power equal in all extremities.   Skin: Warm and dry.  No rashes noted.  Abdominal wound with staples.  Colostomy in place.  Data Reviewed:   CBC: Recent Labs  Lab 01/14/22 0337 01/16/22 0336 01/17/22 0253 01/18/22 0135 01/19/22 0308 01/20/22 0415  WBC 27.7* 17.8* 16.1* 15.4* 13.1* 12.1*  NEUTROABS 20.2*  --   --   --   --   --   HGB 9.9* 8.4* 8.6* 9.1* 8.7* 8.9*  HCT 29.5* 25.8* 26.2* 27.4* 26.6* 27.6*  MCV 75.4* 77.0* 77.3* 74.5* 77.1* 78.2*  PLT 443* 461* 476* 520* 496* 496*     Basic Metabolic Panel: Recent Labs  Lab 01/13/22 2259 01/14/22 0337 01/15/22 0400 01/16/22 0336 01/17/22 0253 01/18/22 0135 01/19/22 0308 01/20/22 0415  NA 136 136 134* 135 133* 133* 132* 131*  K 4.1 4.1 4.6 4.7 4.5 4.3 3.7 3.8  CL 101 103 104 103 102 102 100 103  CO2 '25 24 25 24 24 23 22 23   '$ GLUCOSE 141* 132* 126* 129* 118* 101* 107* 100*  BUN '20 17 11 9 '$ 7* 10 7* 6*  CREATININE 0.55* 0.51* 0.44* 0.39* 0.49* 0.59* 0.58* 0.49*  CALCIUM 8.6* 8.5* 8.3* 8.4* 8.6* 8.6* 8.7* 8.7*  MG 2.4 2.4  --   --  2.0  --   --  2.0  PHOS 3.2 3.0 3.6 3.8 4.2  --   --  3.7     Liver Function Tests: Recent Labs  Lab 01/14/22 0337 01/17/22 0253 01/20/22 0415  AST 18 55* 22  ALT 24 66* 41  ALKPHOS 47 62 58  BILITOT 0.5 0.4 0.4  PROT 7.2 7.2 7.3  ALBUMIN 2.0* 2.0* 2.0*      Radiology Studies: No results found.    LOS: 17 days    Flora Lipps, MD [facility] Available via Epic secure chat 7am-7pm After these  hours, please refer to coverage provider listed on [email or web address] 01/20/2022, 10:51 AM

## 2022-01-20 NOTE — Progress Notes (Signed)
Mobility Specialist Progress Note:   01/20/22 1500  Mobility  Activity Ambulated with assistance in hallway  Level of Assistance Minimal assist, patient does 75% or more  Assistive Device Front wheel walker  Distance Ambulated (ft) 180 ft  Activity Response Tolerated well  $Mobility charge 1 Mobility   Pt very reluctant to participate in mobility. Required minA to stand from elevated bed, modA from low surfaces. Pt required x1 seated rest break d/t fatigue. Did not follow cues for RW proximity or posture. Pt left in chair with chair alarm on, all needs met.   Nelta Numbers Acute Rehab Secure Chat or Office Phone: [phone]

## 2022-01-20 NOTE — Progress Notes (Signed)
Patient ID: Carl Barr, male   DOB: [birth year], 66 y.o.   MRN: [id number] [facility] Surgery Progress Note:   16 Days Post-Op   THE PLAN  Remove residual staples.  Lower portion of wound is open and packed and upper incision may separate and open.    Get calorie count to assess oral intake.  Motivate patient to get up more and increase activity level.  May benefit from SNF   Subjective: Mental status is alert and talkative.  Complaints none. Objective: Vital signs in last 24 hours: Temp:  [98 F (36.7 C)-99 F (37.2 C)] 98 F (36.7 C) (07/16 0850) Pulse Rate:  [86-93] 88 (07/16 0850) Resp:  [18] 18 (07/16 0850) BP: (111-130)/(70-81) 111/79 (07/16 0850) SpO2:  [98 %-100 %] 100 % (07/16 0850) Weight:  [94.9 kg] 94.9 kg (07/16 0633)  Intake/Output from previous day: 07/15 0701 - 07/16 0700 In: 1441.8 [P.O.:750; I.V.:591.8; IV Piggyback:100] Out: 3300 [Urine:2500; Stool:800] Intake/Output this shift: No intake/output data recorded.  Physical Exam: Work of breathing is normal;  will remove residual upper staples.  Lower part of wound is packed and looks clean.  He does not seem to be motivated to get up and about  Lab Results:  Results for orders placed or performed during the hospital encounter of 01/03/22 (from the past 48 hour(s))  Basic metabolic panel     Status: Abnormal   Collection Time: 01/19/22  3:08 AM  Result Value Ref Range   Sodium 132 (L) 135 - 145 mmol/L   Potassium 3.7 3.5 - 5.1 mmol/L   Chloride 100 98 - 111 mmol/L   CO2 22 22 - 32 mmol/L   Glucose, Bld 107 (H) 70 - 99 mg/dL    Comment: Glucose reference range applies only to samples taken after fasting for at least 8 hours.   BUN 7 (L) 8 - 23 mg/dL   Creatinine, Ser 0.58 (L) 0.61 - 1.24 mg/dL   Calcium 8.7 (L) 8.9 - 10.3 mg/dL   GFR, Estimated >60 >60 mL/min    Comment: (NOTE) Calculated using the CKD-EPI Creatinine Equation (2021)    Anion gap 10 5 - 15    Comment: Performed at [facility] [address] [place], [place] [address]  CBC     Status: Abnormal   Collection Time: 01/19/22  3:08 AM  Result Value Ref Range   WBC 13.1 (H) 4.0 - 10.5 K/uL   RBC 3.45 (L) 4.22 - 5.81 MIL/uL   Hemoglobin 8.7 (L) 13.0 - 17.0 g/dL    Comment: Reticulocyte Hemoglobin testing may be clinically indicated, consider ordering this additional test [id number]    HCT 26.6 (L) 39.0 - 52.0 %   MCV 77.1 (L) 80.0 - 100.0 fL   MCH 25.2 (L) 26.0 - 34.0 pg   MCHC 32.7 30.0 - 36.0 g/dL   RDW 18.6 (H) 11.5 - 15.5 %   Platelets 496 (H) 150 - 400 K/uL   nRBC 0.0 0.0 - 0.2 %    Comment: Performed at [facility], [place] [address] [place], [place] [address]  CBC     Status: Abnormal   Collection Time: 01/20/22  4:15 AM  Result Value Ref Range   WBC 12.1 (H) 4.0 - 10.5 K/uL   RBC 3.53 (L) 4.22 - 5.81 MIL/uL   Hemoglobin 8.9 (L) 13.0 - 17.0 g/dL   HCT 27.6 (L) 39.0 - 52.0 %   MCV 78.2 (L) 80.0 - 100.0 fL  MCH 25.2 (L) 26.0 - 34.0 pg   MCHC 32.2 30.0 - 36.0 g/dL   RDW 18.3 (H) 11.5 - 15.5 %   Platelets 496 (H) 150 - 400 K/uL   nRBC 0.0 0.0 - 0.2 %    Comment: Performed at [facility] [address], [place], [place] [address]  Comprehensive metabolic panel     Status: Abnormal   Collection Time: 01/20/22  4:15 AM  Result Value Ref Range   Sodium 131 (L) 135 - 145 mmol/L   Potassium 3.8 3.5 - 5.1 mmol/L   Chloride 103 98 - 111 mmol/L   CO2 23 22 - 32 mmol/L   Glucose, Bld 100 (H) 70 - 99 mg/dL    Comment: Glucose reference range applies only to samples taken after fasting for at least 8 hours.   BUN 6 (L) 8 - 23 mg/dL   Creatinine, Ser 0.49 (L) 0.61 - 1.24 mg/dL   Calcium 8.7 (L) 8.9 - 10.3 mg/dL   Total Protein 7.3 6.5 - 8.1 g/dL   Albumin 2.0 (L) 3.5 - 5.0 g/dL   AST 22 15 - 41 U/L   ALT 41 0 - 44 U/L   Alkaline Phosphatase 58 38 - 126 U/L   Total Bilirubin 0.4 0.3 - 1.2 mg/dL   GFR, Estimated >60 >60 mL/min    Comment: (NOTE) Calculated using the CKD-EPI  Creatinine Equation (2021)    Anion gap 5 5 - 15    Comment: Performed at [facility], [place] [address], [place], [place] [address]  Magnesium     Status: None   Collection Time: 01/20/22  4:15 AM  Result Value Ref Range   Magnesium 2.0 1.7 - 2.4 mg/dL    Comment: Performed at [facility] [address], [place], [place] [address]  Phosphorus     Status: None   Collection Time: 01/20/22  4:15 AM  Result Value Ref Range   Phosphorus 3.7 2.5 - 4.6 mg/dL    Comment: Performed at [facility] [address] [place], [place] [address]    Radiology/Results: No results found.  Anti-infectives: Anti-infectives (From admission, onward)    Start     Dose/Rate Route Frequency Ordered Stop   01/04/22 0830  cefoTEtan (CEFOTAN) 2 g in sodium chloride 0.9 % 100 mL IVPB        2 g 200 mL/hr over 30 Minutes Intravenous  Once 01/04/22 0805 01/04/22 1010       Assessment/Plan: Problem List: Patient Active Problem List   Diagnosis Date Noted   Adenocarcinoma of colon ([place]) 01/15/2022   Malnutrition of moderate degree 01/11/2022   Open abdominal wall wound 01/11/2022   Poor social situation 01/10/2022   Hypomagnesemia 01/09/2022   Hypophosphatemia 01/09/2022   Alcohol use 01/04/2022   Hypokalemia 01/04/2022   Large bowel obstruction ([place]) 01/03/2022   Lumbar degenerative disc disease 12/16/2018   Chronic pain of left knee 12/16/2018   Encounter for long-term (current) use of medications 12/16/2018    Will need social service to assess home situation.  He may need to go to a SNF. 16 Days Post-Op    LOS: 17 days   Carl B. Hassell Done, MD, [facility] [phone] to reach the surgeon on call.    01/20/2022 9:42 AM

## 2022-01-21 DIAGNOSIS — K56609 Unspecified intestinal obstruction, unspecified as to partial versus complete obstruction: Secondary | ICD-10-CM | POA: Diagnosis not present

## 2022-01-21 LAB — MAGNESIUM: Magnesium: 1.8 mg/dL (ref 1.7–2.4)

## 2022-01-21 LAB — CBC
HCT: 27.4 % — ABNORMAL LOW (ref 39.0–52.0)
Hemoglobin: 8.8 g/dL — ABNORMAL LOW (ref 13.0–17.0)
MCH: 24.8 pg — ABNORMAL LOW (ref 26.0–34.0)
MCHC: 32.1 g/dL (ref 30.0–36.0)
MCV: 77.2 fL — ABNORMAL LOW (ref 80.0–100.0)
Platelets: 480 10*3/uL — ABNORMAL HIGH (ref 150–400)
RBC: 3.55 MIL/uL — ABNORMAL LOW (ref 4.22–5.81)
RDW: 18.1 % — ABNORMAL HIGH (ref 11.5–15.5)
WBC: 12.2 10*3/uL — ABNORMAL HIGH (ref 4.0–10.5)
nRBC: 0 % (ref 0.0–0.2)

## 2022-01-21 LAB — BASIC METABOLIC PANEL
Anion gap: 8 (ref 5–15)
BUN: 7 mg/dL — ABNORMAL LOW (ref 8–23)
CO2: 23 mmol/L (ref 22–32)
Calcium: 8.7 mg/dL — ABNORMAL LOW (ref 8.9–10.3)
Chloride: 101 mmol/L (ref 98–111)
Creatinine, Ser: 0.54 mg/dL — ABNORMAL LOW (ref 0.61–1.24)
GFR, Estimated: >60 mL/min (ref >60)
Glucose, Bld: 101 mg/dL — ABNORMAL HIGH (ref 70–99)
Potassium: 3.4 mmol/L — ABNORMAL LOW (ref 3.5–5.1)
Sodium: 132 mmol/L — ABNORMAL LOW (ref 135–145)

## 2022-01-21 MED ORDER — ACETAMINOPHEN 325 MG PO TABS
650.0000 mg | ORAL_TABLET | Freq: Four times a day (QID) | ORAL | Status: DC | PRN
Start: 2022-01-21 — End: 2022-01-28

## 2022-01-21 MED ORDER — METHOCARBAMOL 500 MG PO TABS
500.0000 mg | ORAL_TABLET | Freq: Four times a day (QID) | ORAL | Status: DC | PRN
Start: 2022-01-21 — End: 2022-01-28
  Administered 2022-01-22 – 2022-01-28 (×4): 500 mg via ORAL
  Filled 2022-01-21 (×4): qty 1

## 2022-01-21 MED ORDER — OXYCODONE HCL 5 MG PO TABS: 2.5000 mg | ORAL_TABLET | Freq: Four times a day (QID) | ORAL | Status: DC | PRN

## 2022-01-21 NOTE — Consult Note (Signed)
[place] Nurse ostomy follow up  Patient receiving care in [facility] 6N11 Stoma type/location: LLQ colostomy Stomal assessment/size: 2 inches oval, pink, moist just above skin level with mucocutaneous separation at 3 o'clock that is 1 cm in depth  Peristomal assessment: sutures intact Treatment options for stomal/peristomal skin: barrier ring Output : mushy yellow Ostomy pouching: 2pc. 2 3/4 inch pouch with barrier ring. Left HOP pouch in room in case output is too high for regular pouch. Bedside RN stated he had clumps in the stool today that made it more difficult to empty the HOP pouch.  Education provided: None today. Patient confused and continuously ask "what is this thing. Someone has to take this thing away. Why do I have this?" Patient does not seem to comprehend and this nurse is unsure if he will be able to take care of his ostomy without assistance. States he lives with a cousin.  Enrolled patient in [facility] program: Yes   WOC will follow weekly. Re-consult for further needs.    Cathlean Marseilles [name], MSN, RN, [place], Lysle Pearl, [facility] Wound Treatment Associate Pager [phone]

## 2022-01-21 NOTE — Progress Notes (Signed)
Brief Nutrition Note  Calorie count initiated by surgery team 7/16. TPN discontinued 7/13 after pt tolerated soft diet. Team notes that pt is slow to improve and not participating in care.   RD went to collect meal tickets to determine intake amounts and no envelope present on door and no meal tickets available to review. Renewed kcal count and discussed with RN. Requested envelope be hung and tickets be documented. Will follow-up with results of kcal count Wednesday after 24 hours of meal tickets are available.   For last full assessment, see RD note from 7/13.  Ranell Patrick, RD, LDN Clinical Dietitian RD pager # available in [place]  After hours/weekend pager # available in [facility]

## 2022-01-21 NOTE — Progress Notes (Addendum)
Physical Therapy Re-evaluation Patient Details Name: Carl Barr MRN: [id number] DOB: [birth year] Today's Date: 01/21/2022  History of Present Illness  66 year old male without any past medical history presents to the ER with complaints of constipation with nausea and vomiting.  Found to have bowel obstruction with colonic stricture concerning for possible malignancy.  S/P Left sigmoid and distal transverse colectomy and colostomy creation on 6/30.  7/3, developed ileus with nausea and vomiting.  Continues to have output from the colostomy bag.  Unable to insert NG x2.  Clinical Impression  Pt agreeable to physical therapy session. Pt continues to have confusion and lack of safety awareness but he was able to increase gait distance with RW. Pt with instability using RW when performing sit to stands despite cues. Pt states he plans to go to rehab as he lives with other men and they are unable to assist him. Pt to continue to benefit from skilled, acute care physical therapy interventions to maximize his current level of function and progress towards established goals. Goals have been updated accordingly.      Recommendations for follow up therapy are one component of a multi-disciplinary discharge planning process, led by the attending physician.  Recommendations may be updated based on patient status, additional functional criteria and insurance authorization.  Follow Up Recommendations Skilled nursing-short term rehab (<3 hours/day) Can patient physically be transported by private vehicle: Yes    Assistance Recommended at Discharge Intermittent Supervision/Assistance  Patient can return home with the following  A little help with bathing/dressing/bathroom;Assistance with cooking/housework;Direct supervision/assist for medications management;Assist for transportation;Help with stairs or ramp for entrance;A lot of help with walking and/or transfers    Equipment Recommendations Rolling walker (2  wheels);BSC/3in1  Recommendations for Other Services       Functional Status Assessment       Precautions / Restrictions Precautions Precautions: Fall Precaution Comments: colostomy Restrictions Weight Bearing Restrictions: No      Mobility  Bed Mobility Overal bed mobility: Needs Assistance Bed Mobility: Supine to Sit     Supine to sit: Min assist     General bed mobility comments: Pt required increased time to complete supine to sit due to impaired motor planning. Pt used bed rail on R.    Transfers Overall transfer level: Needs assistance Equipment used: Rolling walker (2 wheels) Transfers: Sit to/from Stand Sit to Stand: From elevated surface, Min assist           General transfer comment: Pt performed total of 5 sit to stands during this encounter (4 from chair including three consecutive and one from bed). Pt pulling RW up off floor when standing despite cues and required therapist to hold RW down. Pt with difficulty adhering to instructions.    Ambulation/Gait Ambulation/Gait assistance: Min guard Gait Distance (Feet): 150 Feet Assistive device: Rolling walker (2 wheels) Gait Pattern/deviations: Step-through pattern Gait velocity: decreased     General Gait Details: Pt ambulated 75 feet x 2 with chair following and required one seated rest break. Pt provided with cues to improve on posture but was not amendable to. Pt required increased cues for safety awareness.  Stairs            Wheelchair Mobility    Modified Rankin (Stroke Patients Only)       Balance Overall balance assessment: Needs assistance Sitting-balance support: No upper extremity supported, Feet supported Sitting balance-Leahy Scale: Fair     Standing balance support: Bilateral upper extremity supported Standing balance-Leahy Scale: Poor Standing balance comment:  relies on UE support                             Pertinent Vitals/Pain Pain Assessment Pain  Assessment: 0-10 Pain Location: stomach Pain Descriptors / Indicators: Discomfort, Guarding Pain Intervention(s): Premedicated before session    Home Living                          Prior Function                       Hand Dominance        Extremity/Trunk Assessment                Communication      Cognition Arousal/Alertness: Awake/alert Behavior During Therapy: Flat affect Overall Cognitive Status: Impaired/Different from baseline Area of Impairment: Following commands, Safety/judgement, Problem solving, Memory                     Memory: Decreased short-term memory, Decreased recall of precautions Following Commands: Follows one step commands with increased time Safety/Judgement: Decreased awareness of safety, Decreased awareness of deficits   Problem Solving: Difficulty sequencing, Requires verbal cues, Decreased initiation, Slow processing General Comments: Pt A and O x 4 but confused and has difficulty making his needs clear.        General Comments      Exercises General Exercises - Lower Extremity Ankle Circles/Pumps: Strengthening, Both, 10 reps, AROM, Seated (hee/toe raises)   Assessment/Plan    PT Assessment    PT Problem List         PT Treatment Interventions      PT Goals (Current goals can be found in the Care Plan section)  Acute Rehab PT Goals Patient Stated Goal: to go home PT Goal Formulation: With patient Time For Goal Achievement: 01/29/22 Potential to Achieve Goals: Good    Frequency Min 3X/week     Co-evaluation               AM-PAC PT "6 Clicks" Mobility  Outcome Measure Help needed turning from your back to your side while in a flat bed without using bedrails?: A Little Help needed moving from lying on your back to sitting on the side of a flat bed without using bedrails?: A Little Help needed moving to and from a bed to a chair (including a wheelchair)?: A Little Help needed standing  up from a chair using your arms (e.g., wheelchair or bedside chair)?: A Little Help needed to walk in hospital room?: A Little Help needed climbing 3-5 steps with a railing? : A Lot 6 Click Score: 17    End of Session Equipment Utilized During Treatment: Gait belt Activity Tolerance: Patient limited by fatigue;Patient tolerated treatment well Patient left: with call bell/phone within reach;in chair;with chair alarm set Nurse Communication: Mobility status PT Visit Diagnosis: Unsteadiness on feet (R26.81);Muscle weakness (generalized) (M62.81)    Time: 4580-9983 PT Time Calculation (min) (ACUTE ONLY): 26 min   Charges:   PT Evaluation $PT Re-evaluation: 1 Re-eval PT Treatments $Gait Training: 8-22 mins        Donna Bernard, PT   [facility] 01/21/2022, 2:07 PM

## 2022-01-21 NOTE — Progress Notes (Signed)
17 Days Post-Op  Subjective: CC: No abdominal pain. Tolerating diet and trying to eat more. Drank his shakes yesterday. Has finished ~1/2 breakfast but is still working on it. No n/v. Having ostomy output. Working with Engineer, maintenance.  Objective: Vital signs in last 24 hours: Temp:  [97.4 F (36.3 C)-98.3 F (36.8 C)] 97.4 F (36.3 C) (07/17 0805) Pulse Rate:  [52-97] 92 (07/17 0805) Resp:  [16-17] 16 (07/17 0805) BP: (106-121)/(70-76) 111/70 (07/17 0805) SpO2:  [98 %-100 %] 99 % (07/17 0805) Last BM Date : 01/19/22  Intake/Output from previous day: 07/16 0701 - 07/17 0700 In: 680 [P.O.:240; IV Piggyback:440] Out: 1125 [Urine:125; Stool:1000] Intake/Output this shift: No intake/output data recorded.  PE: Gen:  Alert, NAD, pleasant Abd: Soft, ND, NT, +BS, ostomy with large amount of liquid stool in bag. Unable to see stoma 2/2 stool. Midline wound - superior aspect with staples still in place, no surrounding skin changes; inferior aspect open and packed. Purulent drainage on gauze. Wound is ~5cm deep with some fibrinous tissue at the base but otherwise with healthy granulation tissue on the sides. This undermines superiorly below the skin ~ 7cm with purulent like drainage coming from this area.        Lab Results:  Recent Labs    01/20/22 0415 01/21/22 0206  WBC 12.1* 12.2*  HGB 8.9* 8.8*  HCT 27.6* 27.4*  PLT 496* 480*   BMET Recent Labs    01/20/22 0415 01/21/22 0206  NA 131* 132*  K 3.8 3.4*  CL 103 101  CO2 23 23  GLUCOSE 100* 101*  BUN 6* 7*  CREATININE 0.49* 0.54*  CALCIUM 8.7* 8.7*   PT/INR No results for input(s): "LABPROT", "INR" in the last 72 hours. CMP     Component Value Date/Time   NA 132 (L) 01/21/2022 0206   NA 139 12/16/2018 1142   K 3.4 (L) 01/21/2022 0206   CL 101 01/21/2022 0206   CO2 23 01/21/2022 0206   GLUCOSE 101 (H) 01/21/2022 0206   BUN 7 (L) 01/21/2022 0206   BUN 15 12/16/2018 1142   CREATININE 0.54 (L)  01/21/2022 0206   CALCIUM 8.7 (L) 01/21/2022 0206   PROT 7.3 01/20/2022 0415   PROT 8.0 12/16/2018 1142   ALBUMIN 2.0 (L) 01/20/2022 0415   ALBUMIN 4.1 12/16/2018 1142   AST 22 01/20/2022 0415   ALT 41 01/20/2022 0415   ALKPHOS 58 01/20/2022 0415   BILITOT 0.4 01/20/2022 0415   BILITOT 0.4 12/16/2018 1142   GFRNONAA >60 01/21/2022 0206   GFRAA >60 08/01/2019 0654   Lipase     Component Value Date/Time   LIPASE 29 01/03/2022 1714    Studies/Results: No results found.  Anti-infectives: Anti-infectives (From admission, onward)    Start     Dose/Rate Route Frequency Ordered Stop   01/04/22 0830  cefoTEtan (CEFOTAN) 2 g in sodium chloride 0.9 % 100 mL IVPB        2 g 200 mL/hr over 30 Minutes Intravenous  Once 01/04/22 0805 01/04/22 1010        Assessment/Plan POD#17 s/p open extended left hemicolectomy with end colostomy for a colonic obstruction secondary to sigmoid mass and thickening of splenic flexure by Dr. Grandville Silos on 01/04/22 - Surgical pathology shows a well-differentiated colon adenocarcinoma, stage [id number]. Chest CT shows no evidence of metastatic disease. Will need chemotherapy once recovered, medical oncology (Dr. Benay Spice) saw has seen the patient. They are scheduling outpatient f/u.  - CT  7/5: with ileus but no abscess or leak noted, mild wall thickening of transverse colon - Large volume stool from ostomy - C. Diff neg. Outpt improving over the weekend at 800-1048m/day. Do not recommend immodium given prior ileus and ongoing distension. Continue to monitor. - TPN off. Continue soft diet. Shakes. Nutrition Consult. Calorie Count.   - BID WTD to inferior aspect of wound. Staples were supposed to be removed yesterday but are still in place. Plan to remove today. Will discuss with MD if should just pack the area of undermining well with BID WTD vs try to open the wound further (which may be difficult given how far he is out from surgery).  - Mobilize, PT following.  Have discussed multiple times with patient the importance of mobility. PT recommending SNF  FEN: Soft diet, esnure, IVF per [facility] VTE: LMWH ID: None currently. Afebrile. Leukocytosis likely secondary to asplenia (stable to improved).  Foley: External Dispo: SNF   LOS: 18 days    MJillyn Ledger, PHouston [facility] Contin[facility] Hospital[facility] 01/21/2022, 9:50 AM Please see Amion for pager number during day hours 7:00am-4:30pm

## 2022-01-21 NOTE — Progress Notes (Signed)
PROGRESS NOTE    Carl Barr  SWF:[id number] DOB: Jan 16, 1956 DOA: 01/03/2022 PCP: Antony Blackbird, MD    Brief Narrative:  Carl Barr is a 66 yo male with newly diagnosed colon cancer s/p left sigmoid and distal transverse hemicolectomy with end colostomy complicated by postoperative ileus.  Patient will follow up with Heme/Onc Dr. Benay Spice for chemotherapy.  7/7-7/13 TPN  Subjective: Carl Barr is tolerating a soft diet. He tells me his stomach hurts. Denies nausea or vomiting. Wounds are stable with mild amount of exudate.  Large amount of liquid stool in bag.  Cdiff was negative. PT recommends short term rehab / SNF placement.  Patient will not be able to take care of himself at home.  Assessment and Plan: Principal Problem:   Large bowel obstruction (HCC) Active Problems:   Open abdominal wall wound   Hypokalemia   Poor social situation   Adenocarcinoma of colon (HCC)   Alcohol use   Hypomagnesemia   Hypophosphatemia   Malnutrition of moderate degree     Large Bowel Obstruction s/p  left sigmoid and distal transverse hemicolectomy with end colostomy complicated by postoperative ileus: - General Surgery is following, appreciate.  Adenocarcinoma of colon ([place]) - Follow up with Heme/Onc outpatient for chemotherapy.   High output colostomy. - Monitor electrolytes.   Poor social situation Patient lives at home with his cousin.  Poor health literacy.   - Will need ostomy education.   Moderate Protein Calorie Malnutrition - Continue oral diet and supplements.   Chronic Alcohol use - No issues with withdrawals. - Counseled on alcohol cessation.   Morbid Obesity: Body mass index is 31.58 kg/m.   - Counseled on weight loss through diet and lifestyle modification.     DVT prophylaxis: enoxaparin (LOVENOX) injection 40 mg Start: 01/07/22 1130 SCDs Start: 01/04/22 0616   Code Status:     Code Status: Full Code  Disposition: Skilled nursing  facility.  Status is: Inpatient  Remains inpatient appropriate because: High output colostomy, awaiting for placement.   Family Communication:  None at bedside  Consultants:  General surgery  Procedures:  Open extended left hemicolectomy with colostomy PICC line placement  Antimicrobials:  None  Anti-infectives (From admission, onward)    Start     Dose/Rate Route Frequency Ordered Stop   01/04/22 0830  cefoTEtan (CEFOTAN) 2 g in sodium chloride 0.9 % 100 mL IVPB        2 g 200 mL/hr over 30 Minutes Intravenous  Once 01/04/22 0805 01/04/22 1010        Objective: Vitals:   01/20/22 1606 01/20/22 2055 01/21/22 0453 01/21/22 0805  BP: 106/76 118/73 121/70 111/70  Pulse: 96 97 (!) 52 92  Resp: '17  16 16  '$ Temp: 98 F (36.7 C) 98 F (36.7 C) 98.3 F (36.8 C) (!) 97.4 F (36.3 C)  TempSrc: Oral Oral Oral Oral  SpO2: 100% 100% 98% 99%  Weight:      Height:        Intake/Output Summary (Last 24 hours) at 01/21/2022 1709 Last data filed at 01/21/2022 1222 Gross per 24 hour  Intake 680 ml  Output 1175 ml  Net -495 ml    Filed Weights   01/18/22 0500 01/19/22 0500 01/20/22 0633  Weight: 97 kg 95.6 kg 94.9 kg    Physical Examination: Body mass index is 30.9 kg/m.   General: chronically ill appearing, obese, fatigued HENT:   No scleral pallor or icterus noted. Oral mucosa is moist.  Chest:  Clear breath sounds.  Diminished breath sounds bilaterally. No crackles or wheezes.  CVS: S1 &S2 heard. No murmur.  Regular rate and rhythm. Abdomen: Soft, nontender, nondistended.  Bowel sounds are heard.  Abdominal scar with staples.  Colostomy bag in place Extremities: No cyanosis, clubbing or edema.  Peripheral pulses are palpable. Psych: Alert, awake and communicative, poor insight. CNS:  No cranial nerve deficits.  Power equal in all extremities.   Skin: Warm and dry.  No rashes noted.  Abdominal wound with staples.  Colostomy in place.  Data Reviewed:    CBC: Recent Labs  Lab 01/17/22 0253 01/18/22 0135 01/19/22 0308 01/20/22 0415 01/21/22 0206  WBC 16.1* 15.4* 13.1* 12.1* 12.2*  HGB 8.6* 9.1* 8.7* 8.9* 8.8*  HCT 26.2* 27.4* 26.6* 27.6* 27.4*  MCV 77.3* 74.5* 77.1* 78.2* 77.2*  PLT 476* 520* 496* 496* 480*     Basic Metabolic Panel: Recent Labs  Lab 01/15/22 0400 01/16/22 0336 01/17/22 0253 01/18/22 0135 01/19/22 0308 01/20/22 0415 01/21/22 0206  NA 134* 135 133* 133* 132* 131* 132*  K 4.6 4.7 4.5 4.3 3.7 3.8 3.4*  CL 104 103 102 102 100 103 101  CO2 '25 24 24 23 22 23 23  '$ GLUCOSE 126* 129* 118* 101* 107* 100* 101*  BUN 11 9 7* 10 7* 6* 7*  CREATININE 0.44* 0.39* 0.49* 0.59* 0.58* 0.49* 0.54*  CALCIUM 8.3* 8.4* 8.6* 8.6* 8.7* 8.7* 8.7*  MG  --   --  2.0  --   --  2.0 1.8  PHOS 3.6 3.8 4.2  --   --  3.7  --      Liver Function Tests: Recent Labs  Lab 01/17/22 0253 01/20/22 0415  AST 55* 22  ALT 66* 41  ALKPHOS 62 58  BILITOT 0.4 0.4  PROT 7.2 7.3  ALBUMIN 2.0* 2.0*      Radiology Studies: No results found.    LOS: 18 days    George Hugh, MD [facility] Available via Epic secure chat 7am-7pm After these hours, please refer to coverage provider listed on [email or web address] 01/21/2022, 5:09 PM

## 2022-01-22 DIAGNOSIS — K56609 Unspecified intestinal obstruction, unspecified as to partial versus complete obstruction: Secondary | ICD-10-CM | POA: Diagnosis not present

## 2022-01-22 LAB — BASIC METABOLIC PANEL
Anion gap: 3 — ABNORMAL LOW (ref 5–15)
BUN: 5 mg/dL — ABNORMAL LOW (ref 8–23)
CO2: 23 mmol/L (ref 22–32)
Calcium: 8.6 mg/dL — ABNORMAL LOW (ref 8.9–10.3)
Chloride: 108 mmol/L (ref 98–111)
Creatinine, Ser: 0.45 mg/dL — ABNORMAL LOW (ref 0.61–1.24)
GFR, Estimated: >60 mL/min (ref >60)
Glucose, Bld: 106 mg/dL — ABNORMAL HIGH (ref 70–99)
Potassium: 3.3 mmol/L — ABNORMAL LOW (ref 3.5–5.1)
Sodium: 134 mmol/L — ABNORMAL LOW (ref 135–145)

## 2022-01-22 LAB — CBC
HCT: 27.8 % — ABNORMAL LOW (ref 39.0–52.0)
Hemoglobin: 8.9 g/dL — ABNORMAL LOW (ref 13.0–17.0)
MCH: 24.9 pg — ABNORMAL LOW (ref 26.0–34.0)
MCHC: 32 g/dL (ref 30.0–36.0)
MCV: 77.9 fL — ABNORMAL LOW (ref 80.0–100.0)
Platelets: 440 10*3/uL — ABNORMAL HIGH (ref 150–400)
RBC: 3.57 MIL/uL — ABNORMAL LOW (ref 4.22–5.81)
RDW: 18.2 % — ABNORMAL HIGH (ref 11.5–15.5)
WBC: 11.2 10*3/uL — ABNORMAL HIGH (ref 4.0–10.5)
nRBC: 0 % (ref 0.0–0.2)

## 2022-01-22 MED ORDER — POTASSIUM CHLORIDE CRYS ER 20 MEQ PO TBCR
20.0000 meq | EXTENDED_RELEASE_TABLET | Freq: Once | ORAL | Status: AC
  Administered 2022-01-22: 20 meq via ORAL
  Filled 2022-01-22: qty 1

## 2022-01-22 NOTE — Progress Notes (Signed)
Mobility Specialist Progress Note:   01/22/22 1600  Mobility  Activity Ambulated with assistance in hallway  Level of Assistance Minimal assist, patient does 75% or more  Assistive Device Front wheel walker  Distance Ambulated (ft) 200 ft  Activity Response Tolerated well  $Mobility charge 1 Mobility   Pt agreeable to mobility session. Required frequent reinforcement throughout session. Pt stood from recliner with minA, ambulated with minG. Pt back in chair with all needs met, family members present.   Nelta Numbers Acute Rehab Secure Chat or Office Phone: [phone]

## 2022-01-22 NOTE — Progress Notes (Signed)
18 Days Post-Op  Subjective: CC: No complaints. Denies abdominal pain, n/v. Tolerating diet. Reports he ate all of his breakfast. Thinks he had some shakes yesterday. Ostomy functioning - output stable to improved at 900cc/24 hours. Voiding. Up the chair this am.   Objective: Vital signs in last 24 hours: Temp:  [98 F (36.7 C)-99 F (37.2 C)] 98 F (36.7 C) (07/18 0856) Pulse Rate:  [82-99] 82 (07/18 0856) Resp:  [16-17] 17 (07/18 0856) BP: (98-124)/(66-72) 112/66 (07/18 0856) SpO2:  [100 %] 100 % (07/18 0856) Weight:  [95.9 kg] 95.9 kg (07/18 0450) Last BM Date : 01/21/22  Intake/Output from previous day: 07/17 0701 - 07/18 0700 In: 120 [P.O.:120] Out: 3025 [Urine:2125; Stool:900] Intake/Output this shift: Total I/O In: -  Out: 775 [Urine:775]  PE: Gen:  Alert, NAD, pleasant Abd: Soft, ND, NT, +BS, ostomy bag with liquid stool in bag. Unable to see stoma 2/2 stool. Midline wound - staples out, superior aspect without surrounding skin changes; inferior aspect open and packed. Wound is ~5cm deep with some fibrinous tissue at the base but otherwise with healthy granulation tissue on the sides. This undermines superiorly below the skin ~ 7cm.  Lab Results:  Recent Labs    01/21/22 0206 01/22/22 0433  WBC 12.2* 11.2*  HGB 8.8* 8.9*  HCT 27.4* 27.8*  PLT 480* 440*   BMET Recent Labs    01/21/22 0206 01/22/22 0433  NA 132* 134*  K 3.4* 3.3*  CL 101 108  CO2 23 23  GLUCOSE 101* 106*  BUN 7* 5*  CREATININE 0.54* 0.45*  CALCIUM 8.7* 8.6*   PT/INR No results for input(s): "LABPROT", "INR" in the last 72 hours. CMP     Component Value Date/Time   NA 134 (L) 01/22/2022 0433   NA 139 12/16/2018 1142   K 3.3 (L) 01/22/2022 0433   CL 108 01/22/2022 0433   CO2 23 01/22/2022 0433   GLUCOSE 106 (H) 01/22/2022 0433   BUN 5 (L) 01/22/2022 0433   BUN 15 12/16/2018 1142   CREATININE 0.45 (L) 01/22/2022 0433   CALCIUM 8.6 (L) 01/22/2022 0433   PROT 7.3 01/20/2022  0415   PROT 8.0 12/16/2018 1142   ALBUMIN 2.0 (L) 01/20/2022 0415   ALBUMIN 4.1 12/16/2018 1142   AST 22 01/20/2022 0415   ALT 41 01/20/2022 0415   ALKPHOS 58 01/20/2022 0415   BILITOT 0.4 01/20/2022 0415   BILITOT 0.4 12/16/2018 1142   GFRNONAA >60 01/22/2022 0433   GFRAA >60 08/01/2019 0654   Lipase     Component Value Date/Time   LIPASE 29 01/03/2022 1714    Studies/Results: No results found.  Anti-infectives: Anti-infectives (From admission, onward)    Start     Dose/Rate Route Frequency Ordered Stop   01/04/22 0830  cefoTEtan (CEFOTAN) 2 g in sodium chloride 0.9 % 100 mL IVPB        2 g 200 mL/hr over 30 Minutes Intravenous  Once 01/04/22 0805 01/04/22 1010        Assessment/Plan POD#18 s/p open extended left hemicolectomy with end colostomy for a colonic obstruction secondary to sigmoid mass and thickening of splenic flexure by Dr. Grandville Silos on 01/04/22 - Surgical pathology shows a well-differentiated colon adenocarcinoma, stage [id number]. Chest CT shows no evidence of metastatic disease. Will need chemotherapy once recovered, medical oncology (Dr. Benay Spice) saw has seen the patient. They are scheduling outpatient f/u.  - CT 7/5: with ileus but no abscess or leak noted, mild wall  thickening of transverse colon - Large volume stool from ostomy - C. Diff neg. Outpt improving compared to last week, now 800-1086m/day. Do not recommend immodium given prior ileus and ongoing distension. Continue to monitor. - TPN off. Continue soft diet. Shakes. Nutrition following. Calorie Count.   - BID WTD to inferior aspect of wound making sure to pack area of undermining. Staples out 7/17. Monitor.  - Mobilize, PT following. Have discussed multiple times with patient the importance of mobility. PT recommending SNF   FEN: Soft diet, esnure, IVF per TRH VTE: SCDs, LMWH ID: None currently. Afebrile. Leukocytosis likely secondary to asplenia (stable to improved).  Foley: External Dispo:  SNF   LOS: 19 days    Carl Barr, PMid[facility] 01/22/2022, 9:07 AM Please see Amion for pager number during day hours 7:00am-4:30pm

## 2022-01-22 NOTE — Progress Notes (Signed)
PICC line removed per order.  Pressure held for 5 minutes, no bleeding or swelling noted.  Vaseline gauze, gauze, and tegaderm applied.  Pt education provided with pt verbalizing understanding.

## 2022-01-22 NOTE — Progress Notes (Signed)
PROGRESS NOTE    Carl Barr  SHF:[id number] DOB: 07/14/1955 DOA: 01/03/2022 PCP: Antony Blackbird, MD    Brief Narrative:  Carl Barr is a 66 yo male with newly diagnosed colon cancer s/p left sigmoid and distal transverse hemicolectomy with end colostomy complicated by postoperative ileus.  Patient will follow up with Heme/Onc Dr. Benay Spice for chemotherapy.  Patient is medically stable for discharge.  He does not meet LTACH criteria.  Case Management is looking at SNF options.  7/7-7/13 TPN 7/17 PICC line removed  Subjective: Carl Barr is tolerating his diet. High output from colostomy bag has resolved. He has no pain complaints. He denies any nausea, vomiting, or abdominal pain. He denies any chest pain or shortness of breath.  Assessment and Plan: Principal Problem:   Large bowel obstruction (HCC) Active Problems:   Open abdominal wall wound   Hypokalemia   Poor social situation   Adenocarcinoma of colon (HCC)   Alcohol use   Hypomagnesemia   Hypophosphatemia   Malnutrition of moderate degree     Large Bowel Obstruction s/p  left sigmoid and distal transverse hemicolectomy with end colostomy complicated by postoperative ileus: - General Surgery is following, appreciate.  Adenocarcinoma of colon ([place]) - Follow up with Heme/Onc Dr. Benay Spice outpatient for chemotherapy.   High output colostomy. - Monitor electrolytes.   Poor social situation Patient lives at home with his cousin.  Poor health literacy.   - Will need ostomy education.   Moderate Protein Calorie Malnutrition - Continue oral diet and supplements.   Chronic Alcohol use - No issues with withdrawals. - Counseled on alcohol cessation.   Morbid Obesity: Body mass index is 31.58 kg/m.   - Counseled on weight loss through diet and lifestyle modification.     DVT prophylaxis: enoxaparin (LOVENOX) injection 40 mg Start: 01/07/22 1130 SCDs Start: 01/04/22 0616   Code Status:     Code Status:  Full Code  Disposition: Skilled nursing facility.  Status is: Inpatient  Remains inpatient appropriate because: High output colostomy, awaiting for placement.   Family Communication:  None at bedside  Consultants:  General surgery  Procedures:  Open extended left hemicolectomy with colostomy PICC line placement  Antimicrobials:  None  Anti-infectives (From admission, onward)    Start     Dose/Rate Route Frequency Ordered Stop   01/04/22 0830  cefoTEtan (CEFOTAN) 2 g in sodium chloride 0.9 % 100 mL IVPB        2 g 200 mL/hr over 30 Minutes Intravenous  Once 01/04/22 0805 01/04/22 1010        Objective: Vitals:   01/22/22 0446 01/22/22 0450 01/22/22 0856 01/22/22 1326  BP: 124/72  112/66 106/79  Pulse: 99  82 93  Resp:   17 18  Temp: 98.3 F (36.8 C)  98 F (36.7 C) 98.3 F (36.8 C)  TempSrc: Oral  Axillary Oral  SpO2: 100%  100% 100%  Weight:  95.9 kg    Height:        Intake/Output Summary (Last 24 hours) at 01/22/2022 1531 Last data filed at 01/22/2022 1300 Gross per 24 hour  Intake 580 ml  Output 3400 ml  Net -2820 ml    Filed Weights   01/19/22 0500 01/20/22 0633 01/22/22 0450  Weight: 95.6 kg 94.9 kg 95.9 kg    Physical Examination: Body mass index is 31.22 kg/m.   General: chronically ill appearing, obese, no acute distress HENT:   No scleral pallor or icterus noted. Oral mucosa is moist.  Chest:  Clear breath sounds.  Diminished breath sounds bilaterally. No crackles or wheezes.  CVS: S1 &S2 heard. No murmur.  Regular rate and rhythm. Abdomen: Soft, nontender, nondistended.  Bowel sounds are heard.  Abdominal scar with staples.  Colostomy bag in place Extremities: No cyanosis, clubbing or edema.  Peripheral pulses are palpable. Psych: Alert, awake and communicative, poor insight. CNS:  No cranial nerve deficits.  Power equal in all extremities.   Skin: Warm and dry.  No rashes noted.  Abdominal wound with staples.  Colostomy in  place.  Data Reviewed:   CBC: Recent Labs  Lab 01/18/22 0135 01/19/22 0308 01/20/22 0415 01/21/22 0206 01/22/22 0433  WBC 15.4* 13.1* 12.1* 12.2* 11.2*  HGB 9.1* 8.7* 8.9* 8.8* 8.9*  HCT 27.4* 26.6* 27.6* 27.4* 27.8*  MCV 74.5* 77.1* 78.2* 77.2* 77.9*  PLT 520* 496* 496* 480* 440*     Basic Metabolic Panel: Recent Labs  Lab 01/16/22 0336 01/17/22 0253 01/18/22 0135 01/19/22 0308 01/20/22 0415 01/21/22 0206 01/22/22 0433  NA 135 133* 133* 132* 131* 132* 134*  K 4.7 4.5 4.3 3.7 3.8 3.4* 3.3*  CL 103 102 102 100 103 101 108  CO2 '24 24 23 22 23 23 23  '$ GLUCOSE 129* 118* 101* 107* 100* 101* 106*  BUN 9 7* 10 7* 6* 7* 5*  CREATININE 0.39* 0.49* 0.59* 0.58* 0.49* 0.54* 0.45*  CALCIUM 8.4* 8.6* 8.6* 8.7* 8.7* 8.7* 8.6*  MG  --  2.0  --   --  2.0 1.8  --   PHOS 3.8 4.2  --   --  3.7  --   --      Liver Function Tests: Recent Labs  Lab 01/17/22 0253 01/20/22 0415  AST 55* 22  ALT 66* 41  ALKPHOS 62 58  BILITOT 0.4 0.4  PROT 7.2 7.3  ALBUMIN 2.0* 2.0*      Radiology Studies: No results found.    LOS: 19 days    George Hugh, MD [facility] Available via Epic secure chat 7am-7pm After these hours, please refer to coverage provider listed on [email or web address] 01/22/2022, 3:31 PM

## 2022-01-22 NOTE — TOC Progression Note (Signed)
Transition of Care [facility]) - Progression Note    Patient Details  Name: Carl Barr MRN: [id number] Date of Birth: [birth year]  Transition of Care [facility]) CM/SW [place], [place] Phone Number: 01/22/2022, 4:20 PM  Clinical Narrative:    CSW spoke with pt niece listed on pt emergency contact list. She informed CSW that she has offer pt a room at her house but pt refuses bc he is not allowed to drink or do drugs there. Pt niece is also a CNA and is able to help him with health care needs but pt is not willing to receive help bc he does not want to stop drinking. She has informed pt that he needs to learn to do his own ostomy and she will continue to follow up with pt on LTC options if he is not willing to learn. Pt lives in a shared house with a cousin who also does drugs and drinks. Pt does not have a bed according to niece, he sleeps in a recliner. CSW will follow up with pt on DC options and encourage teaching.    Expected Discharge Plan: [place] Barriers to Discharge: Continued Medical Work up, SNF Pending bed offer  Expected Discharge Plan and Services Expected Discharge Plan: [place] In-house Referral: Clinical Social Work Discharge Planning Services: CM Consult Post Acute Care Choice: [place] arrangements for the past 2 months: [place]                   DME Agency:  (Await PT eval)       HH Arranged: Therapist, sports (Await PT eval) [place] Agency: [place]         Social Determinants of Health (SDOH) Interventions    Readmission Risk Interventions     No data to display

## 2022-01-22 NOTE — TOC Progression Note (Addendum)
Transition of Care (TOC) - Progression Note  Marvetta Gibbons RN, BSN Transitions of Care Unit 4E- RN Case Manager See Treatment Team for direct phone #  Cross coverage for 6N  Patient Details  Name: Carl Barr MRN: [id number] Date of Birth: 09-26-1955  Transition of Care [facility]) CM/SW Contact  Dahlia Client, Romeo Rabon, RN Phone Number: 01/22/2022, 12:07 PM  Clinical Narrative:    Requested by attending MD to look into LTACH option as pt is not progressing well and still continues to have high colostomy output and low po intake. Calorie Count in progress.  CM spoke with both [facility] hospital liaisons to discuss LTACH options- unfortunately pt does not have ICU days or REV codes to meet LTACH guidelines and would not qualify a this time.   CSW and attending updated- will need to continue to look for SNF vs LTC bed.    Expected Discharge Plan: [place] Barriers to Discharge: Continued Medical Work up, SNF Pending bed offer  Expected Discharge Plan and Services Expected Discharge Plan: [place] In-house Referral: Clinical Social Work Discharge Planning Services: CM Consult Post Acute Care Choice: [place] arrangements for the past 2 months: [place]                   DME Agency:  (Await PT eval)       HH Arranged: Therapist, sports (Await PT eval) [place] Agency: [place]         Social Determinants of Health (SDOH) Interventions    Readmission Risk Interventions     No data to display

## 2022-01-23 ENCOUNTER — Inpatient Hospital Stay: Payer: Medicare Other | Admitting: Oncology

## 2022-01-23 DIAGNOSIS — K56609 Unspecified intestinal obstruction, unspecified as to partial versus complete obstruction: Secondary | ICD-10-CM | POA: Diagnosis not present

## 2022-01-23 DIAGNOSIS — C189 Malignant neoplasm of colon, unspecified: Secondary | ICD-10-CM | POA: Diagnosis not present

## 2022-01-23 DIAGNOSIS — E440 Moderate protein-calorie malnutrition: Secondary | ICD-10-CM | POA: Diagnosis not present

## 2022-01-23 NOTE — Progress Notes (Signed)
Nutrition Follow-up  DOCUMENTATION CODES:  Non-severe (moderate) malnutrition in context of social or environmental circumstances  INTERVENTION:  Continue current diet as ordered, encouraged PO intake Ensure Enlive po BID, each supplement provides 350 kcal and 20 grams of protein. Continue PO vitamin regimen MVI, thiamine, and folic acid for hx of EtOH abuse  NUTRITION DIAGNOSIS:  Moderate Malnutrition related to social / environmental circumstances (etoh abuse) as evidenced by mild fat depletion, moderate muscle depletion. - remains applicable  GOAL:  Patient will meet greater than or equal to 90% of their needs - progressing, diet and supplements in place  MONITOR:  Labs, Diet advancement, Weight trends, Skin, I & O's, Other (Comment) (TPN)  REASON FOR ASSESSMENT:  Consult New TPN/TNA  ASSESSMENT:  66 y/o male with h/o etoh abuse who is admitted with LBO secondary to colon mass now s/p L sigmoid and distal transverse colecotmy with colostomy 9/67 complicated by post op ileus.  Pt resting in chair at time of assessment. Breakfast tray at bedside mostly consumed (~75% of meal). Pt reports that he has been eating well. Has several fruit cups and snacks at bedside. Also endorses drinking his ensure.   Kcal count restarted Monday after no meal tickets were saved. Discussed with RN Monday. Went to collect tickets this AM and no envelope was present and no meal tickets recorded. Discussed with surgery team, ok to dc kcal count as pt also reported to them that intake was improved.  Ostomy output seems to be improving based on the previous 24 hours.  Stool Output 682m over the last 24 hours  Average Meal Intake: 7/11-7/13: 32% intake x 3 recorded meals 7/14-7/19: 41% intake x 7 recorded meals  Nutritionally Relevant Medications: Scheduled Meds:  Ensure Enlive  237 mL Oral BID BM   folic acid  1 mg Oral Daily   multivitamin with minerals  1 tablet Oral Daily   thiamine  100 mg  Oral Daily   Continuous Infusions:  lactated ringers 50 mL/hr at 01/23/22 0311   Labs Reviewed: Sodium 134 K 3.3 BUN 5, creatinine .46 NUTRITION - FOCUSED PHYSICAL EXAM: Flowsheet Row Most Recent Value  Orbital Region Mild depletion  Upper Arm Region No depletion  Thoracic and Lumbar Region Mild depletion  Buccal Region Mild depletion  Temple Region Moderate depletion  Clavicle and Acromion Bone Region Moderate depletion  Scapular Bone Region Mild depletion  Dorsal Hand Moderate depletion  Patellar Region Moderate depletion  Anterior Thigh Region Mild depletion  Posterior Calf Region Moderate depletion  Edema (RD Assessment) None  Hair Reviewed  Eyes Reviewed  Mouth Reviewed  Skin Reviewed  Nails Reviewed   Diet Order:   Diet Order             DIET SOFT Room service appropriate? Yes; Fluid consistency: Thin  Diet effective now                   EDUCATION NEEDS:  Education needs have been addressed  Skin:  Skin Assessment: Reviewed RN Assessment (incision abdomen)  Last BM:  7/17  Height:  Ht Readings from Last 1 Encounters:  01/04/22 '5\' 9"'$  (1.753 m)    Weight:  Wt Readings from Last 1 Encounters:  01/22/22 95.9 kg    Ideal Body Weight:  72.7 kg  BMI:  Body mass index is 31.22 kg/m.  Estimated Nutritional Needs:  Kcal:  2000-2300kcal/day Protein:  100-115g/day Fluid:  1.9-2.2L/day    RRanell Patrick RD, LDN Clinical Dietitian RD pager #  available in AMION  After hours/weekend pager # available in [facility]

## 2022-01-23 NOTE — Progress Notes (Signed)
Physical Therapy Treatment Patient Details Name: Carl Barr MRN: [id number] DOB: Mar 13, 1956 Today's Date: 01/23/2022   History of Present Illness 66 year old male without any past medical history presents to the ER with complaints of constipation with nausea and vomiting.  Found to have bowel obstruction with colonic stricture concerning for possible malignancy.  S/P Left sigmoid and distal transverse colectomy and colostomy creation on 6/30.  7/3, developed ileus with nausea and vomiting.  Continues to have output from the colostomy bag.  Unable to insert NG x2.    PT Comments    Pt admitted with above diagnosis. Pt was able to ambulate with min guard assist and chair follow for safety. Pt needs cues for postural stability as well with gait as pt flexes at trunk.Pt needs sitting rest breaks and then can continue. Needs cues for safety throughout.   Pt currently with functional limitations due to balance and endurance deficits. Pt will benefit from skilled PT to increase their independence and safety with mobility to allow discharge to the venue listed below.      Recommendations for follow up therapy are one component of a multi-disciplinary discharge planning process, led by the attending physician.  Recommendations may be updated based on patient status, additional functional criteria and insurance authorization.  Follow Up Recommendations  Skilled nursing-short term rehab (<3 hours/day) Can patient physically be transported by private vehicle: Yes   Assistance Recommended at Discharge Intermittent Supervision/Assistance  Patient can return home with the following A little help with bathing/dressing/bathroom;Assistance with cooking/housework;Direct supervision/assist for medications management;Assist for transportation;Help with stairs or ramp for entrance;A lot of help with walking and/or transfers   Equipment Recommendations  Rolling walker (2 wheels);BSC/3in1    Recommendations for  Other Services       Precautions / Restrictions Precautions Precautions: Fall Precaution Comments: colostomy Restrictions Weight Bearing Restrictions: No     Mobility  Bed Mobility Overal bed mobility: Needs Assistance Bed Mobility: Supine to Sit Rolling: Min assist Sidelying to sit: Min assist Supine to sit: Min assist     General bed mobility comments: Pt required increased time to complete supine to sit due to impaired motor planning. Pt used bed rail on R.    Transfers Overall transfer level: Needs assistance Equipment used: Rolling walker (2 wheels) Transfers: Sit to/from Stand Sit to Stand: From elevated surface, Min assist           General transfer comment: Pt was able to perform proper hand placement with cues and then reached for RW.  However if not cued, Pt pulling up on RW.  Pt with difficulty adhering to instructions.    Ambulation/Gait Ambulation/Gait assistance: Min guard, Min assist, +2 safety/equipment Gait Distance (Feet): 330 Feet (80 feet, 150 feet, 100 feet) Assistive device: Rolling walker (2 wheels) Gait Pattern/deviations: Step-through pattern, Decreased stride length, Trunk flexed, Wide base of support, Drifts right/left Gait velocity: decreased     General Gait Details: Pt ambulated 3 bouts with chair following and required two seated rest breaks. Pt provided with cues to improve on posture but was not amendable to. Pt required increased cues for safety awareness.   Stairs             Wheelchair Mobility    Modified Rankin (Stroke Patients Only)       Balance Overall balance assessment: Needs assistance Sitting-balance support: No upper extremity supported, Feet supported Sitting balance-Leahy Scale: Fair     Standing balance support: Bilateral upper extremity supported Standing balance-Leahy Scale: Poor Standing  balance comment: relies on UE support                            Cognition Arousal/Alertness:  Awake/alert Behavior During Therapy: Flat affect Overall Cognitive Status: Impaired/Different from baseline Area of Impairment: Following commands, Safety/judgement, Problem solving, Memory                     Memory: Decreased short-term memory, Decreased recall of precautions Following Commands: Follows one step commands with increased time Safety/Judgement: Decreased awareness of safety, Decreased awareness of deficits   Problem Solving: Difficulty sequencing, Requires verbal cues, Decreased initiation, Slow processing General Comments: Pt A and O x 4 but confused and has difficulty making his needs clear.        Exercises General Exercises - Lower Extremity Long Arc Quad: Strengthening, Both, 20 reps, Seated Hip Flexion/Marching: AROM, Both, 10 reps, Seated    General Comments        Pertinent Vitals/Pain Pain Assessment Pain Assessment: No/denies pain    Home Living                          Prior Function            PT Goals (current goals can now be found in the care plan section) Acute Rehab PT Goals Patient Stated Goal: to go home Progress towards PT goals: Progressing toward goals    Frequency    Min 3X/week      PT Plan Current plan remains appropriate    Co-evaluation              AM-PAC PT "6 Clicks" Mobility   Outcome Measure  Help needed turning from your back to your side while in a flat bed without using bedrails?: A Little Help needed moving from lying on your back to sitting on the side of a flat bed without using bedrails?: A Little Help needed moving to and from a bed to a chair (including a wheelchair)?: A Little Help needed standing up from a chair using your arms (e.g., wheelchair or bedside chair)?: A Little Help needed to walk in hospital room?: Total Help needed climbing 3-5 steps with a railing? : A Lot 6 Click Score: 15    End of Session Equipment Utilized During Treatment: Gait belt Activity  Tolerance: Patient limited by fatigue;Patient tolerated treatment well Patient left: with call bell/phone within reach;in chair;with chair alarm set Nurse Communication: Mobility status PT Visit Diagnosis: Unsteadiness on feet (R26.81);Muscle weakness (generalized) (M62.81)     Time: 1006-1030 PT Time Calculation (min) (ACUTE ONLY): 24 min  Charges:  $Gait Training: 8-22 mins $Therapeutic Exercise: 8-22 mins                     Phong Isenberg M,PT Acute Rehab Services [place] 01/23/2022, 1:13 PM

## 2022-01-23 NOTE — Progress Notes (Signed)
PROGRESS NOTE    Carl Barr  DUK:[id number] DOB: 22-Jul-1955 DOA: 01/03/2022 PCP: Antony Blackbird, MD   Brief Narrative: Carl Barr is a 66 y.o. male without a known medical history. He presented secondary to being unable to have bowel movements or pass gas. Imaging identified two colonic strictures with resultant bowel obstruction with concern for malignancy. General surgery was consulted and performed colectomy/colostomy. Surgical pathology identified evidence of colon adenocarcinoma. Medical oncology consulted with plans for outpatient chemotherapy.  Assessment and Plan:  Large bowel obstruction Secondary to stricture/mass. Patient is s/p left sigmoid and distal transverse hemicolectomy with end colostomy by general surgery. Stable.  Colon adenocarcinoma Diagnosed this admission. Medical oncology consulted with plans to initiate adjuvant chemotherapy.  Postoperative ileus Resolved.  High output colostomy Appears to be improved.  Moderate malnutrition Dietitian recommendations (7/19): Continue current diet as ordered, encouraged PO intake Ensure Enlive po BID, each supplement provides 350 kcal and 20 grams of protein. Continue PO vitamin regimen MVI, thiamine, and folic acid for hx of EtOH abuse  Chronic alcohol use Counseled this admission.  Obesity Body mass index is 31.22 kg/m.  DVT prophylaxis: Lovenox Code Status:   Code Status: Full Code Family Communication: None at bedside Disposition Plan: Discharge to SNF pending bed availability   Consultants:  General surgery Medical oncology  Procedures:  LEFT, SIGMOID, AND DISTAL TRANSVERSE COLECTOMY; MOBILIZATION OF SPLENIC FLEXURE; COLOSTOMY (6/30) PICC line (7/7>>7/18)  Antimicrobials: None    Subjective: No issues overnight. Planning to get up today.  Objective: BP 110/72 (BP Location: Left Arm)   Pulse (!) 52   Temp 98.1 F (36.7 C) (Oral)   Resp 18   Ht '5\' 9"'$  (1.753 m)   Wt 95.9 kg   SpO2  100%   BMI 31.22 kg/m   Examination:  General exam: Appears calm and comfortable Respiratory system: Clear to auscultation. Respiratory effort normal. Cardiovascular system: S1 & S2 heard, RRR. No murmurs, rubs, gallops or clicks. Gastrointestinal system: Abdomen is non-distended and soft. Normal bowel sounds heard. Intact wound dressing. Colostomy bag with brown stool. Central nervous system: Alert and oriented. No focal neurological deficits. Musculoskeletal: Trace-1+ BLE edema. No calf tenderness Skin: No cyanosis. No rashes Psychiatry: Judgement and insight appear normal. Mood & affect appropriate.    Data Reviewed: I have personally reviewed following labs and imaging studies  CBC Lab Results  Component Value Date   WBC 11.2 (H) 01/22/2022   RBC 3.57 (L) 01/22/2022   HGB 8.9 (L) 01/22/2022   HCT 27.8 (L) 01/22/2022   MCV 77.9 (L) 01/22/2022   MCH 24.9 (L) 01/22/2022   PLT 440 (H) 01/22/2022   MCHC 32.0 01/22/2022   RDW 18.2 (H) 01/22/2022   LYMPHSABS 1.7 01/14/2022   MONOABS 5.5 (H) 01/14/2022   EOSABS 0.3 01/14/2022   BASOSABS 0.0 37/62/8315     Last metabolic panel Lab Results  Component Value Date   NA 134 (L) 01/22/2022   K 3.3 (L) 01/22/2022   CL 108 01/22/2022   CO2 23 01/22/2022   BUN 5 (L) 01/22/2022   CREATININE 0.45 (L) 01/22/2022   GLUCOSE 106 (H) 01/22/2022   GFRNONAA >60 01/22/2022   GFRAA >60 08/01/2019   CALCIUM 8.6 (L) 01/22/2022   PHOS 3.7 01/20/2022   PROT 7.3 01/20/2022   ALBUMIN 2.0 (L) 01/20/2022   LABGLOB 3.9 12/16/2018   AGRATIO 1.1 (L) 12/16/2018   BILITOT 0.4 01/20/2022   ALKPHOS 58 01/20/2022   AST 22 01/20/2022   ALT 41 01/20/2022  ANIONGAP 3 (L) 01/22/2022    GFR: Estimated Creatinine Clearance: 103.8 mL/min (A) (by C-G formula based on SCr of 0.45 mg/dL (L)).  No results found for this or any previous visit (from the past 240 hour(s)).    Radiology Studies: No results found.    LOS: 20 days    Cordelia Poche,  MD [facility] 01/23/2022, 9:48 AM   If 7PM-7AM, please contact night-coverage [email or web address]

## 2022-01-23 NOTE — Progress Notes (Signed)
Patient refused dressing change, due to sleepy.

## 2022-01-24 DIAGNOSIS — E440 Moderate protein-calorie malnutrition: Secondary | ICD-10-CM | POA: Diagnosis not present

## 2022-01-24 DIAGNOSIS — K56609 Unspecified intestinal obstruction, unspecified as to partial versus complete obstruction: Secondary | ICD-10-CM | POA: Diagnosis not present

## 2022-01-24 DIAGNOSIS — C189 Malignant neoplasm of colon, unspecified: Secondary | ICD-10-CM | POA: Diagnosis not present

## 2022-01-24 NOTE — Progress Notes (Addendum)
HEMATOLOGY-ONCOLOGY PROGRESS NOTE  ASSESSMENT AND PLAN: Stage IIIb (pT4a, pN1b) well-differentiated colon adenocarcinoma -01/03/2022 CT abdomen/pelvis with contrast-2 areas of stricture in the colon, bowel obstruction related to these 2 colonic strictures -01/04/2022 CEA was 5.5 -Status post left hemicolectomy with end colostomy 01/04/2022-surgical path shows a well differentiated colon adenocarcinoma, pT4a pN1b, no loss of mismatch repair protein expression -01/15/2022 CT chest without contrast-no worrisome pulmonary nodules to suggest pulmonary metastatic disease, no mediastinal or hilar adenopathy. Microcytic anemia Thrombocytosis, likely reactive Leukocytosis Protein calorie malnutrition 6.  History of alcohol abuse 7.  High output colostomy  Carl Barr appears unchanged.  We have again discussed the diagnosis with him today.  We recommend adjuvant treatment to begin as an outpatient.  We discussed proceeding with FOLFOX but the patient is not in agreement with proceeding with a Port-A-Cath placement.  Therefore, we will consider him for single agent Xeloda as an outpatient.  Carl Barr is currently awaiting SNF placement.  We will arrange for outpatient follow-up at the cancer center once Carl Barr is discharged.  Recommendations: 1.  Continue postop care per general surgery. 2.  TOC following for SNF placement. 3.  Will arrange for outpatient follow-up at the cancer center to discuss adjuvant treatment.  We will likely proceed with single agent capecitabine as the patient is not interested in Port-A-Cath placement.  Carl Barr  Carl Barr was interviewed and examined.  Carl Barr continues to recover from the colectomy surgery.  The abdominal wound has not healed.  Carl Barr is at high risk of developing recurrent disease over the next several years.  We discussed adjuvant systemic chemotherapy options including FOLFOX and capecitabine.  I explained the expected decrease in the relapse rate associated  5-fluorouracil, and with the addition of oxaliplatin.  I explained the need for Port-A-Cath placement to receive FOLFOX.  Carl Barr indicated Carl Barr does not wish to undergo Port-A-Cath placement and prefers capecitabine.  Outpatient follow-up will be scheduled at the Cancer center for within the next few weeks.  I was present for greater than 50% of today's visit.  I performed medical decision making.  SUBJECTIVE: No new complaints today.  Awaiting SNF placement.  Oncology History  Adenocarcinoma of colon ([place])  01/15/2022 Initial Diagnosis   Adenocarcinoma of colon ([place])   01/18/2022 Cancer Staging   Staging form: Colon and Rectum, AJCC 8th Edition - Clinical: Stage IIIB (cT4a, cN1b, cM0) - Signed by Ladell Pier, MD on 01/18/2022    PHYSICAL EXAMINATION:  Vitals:   01/24/22 0532 01/24/22 0729  BP: 133/90 113/70  Pulse: 93 100  Resp: 18 17  Temp: 98.2 F (36.8 C) 98.1 F (36.7 C)  SpO2: 98% 99%   Filed Weights   01/19/22 0500 01/20/22 0633 01/22/22 0450  Weight: 95.6 kg 94.9 kg 95.9 kg    Intake/Output from previous day: 07/19 0701 - 07/20 0700 In: 1760 [P.O.:1310; I.V.:450] Out: 3800 [Urine:1600; Stool:2200]  Physical Exam Vitals reviewed.  Constitutional:      General: Carl Barr is not in acute distress. HENT:     Head: Normocephalic.  Abdominal:     Palpations: Abdomen is soft.     Comments: Abdominal incision covered with dry dressing, left lower quadrant colostomy  Skin:    General: Skin is warm and dry.  Neurological:     Mental Status: Carl Barr is alert and oriented to person, place, and time.     LABORATORY DATA:  I have reviewed the data as listed    Latest Ref Rng & Units 01/22/2022  4:33 AM 01/21/2022    2:06 AM 01/20/2022    4:15 AM  CMP  Glucose 70 - 99 mg/dL 106  101  100   BUN 8 - 23 mg/dL _0 Creatinine 0.61 - 1.24 mg/dL 0.45  0.54  0.49   Sodium 135 - 145 mmol/L 134  132  131   Potassium 3.5 - 5.1 mmol/L 3.3  3.4  3.8   Chloride 98 - 111 mmol/L  108  101  103   CO2 22 - 32 mmol/L _1 Calcium 8.9 - 10.3 mg/dL 8.6  8.7  8.7   Total Protein 6.5 - 8.1 g/dL   7.3   Total Bilirubin 0.3 - 1.2 mg/dL   0.4   Alkaline Phos 38 - 126 U/L   58   AST 15 - 41 U/L   22   ALT 0 - 44 U/L   41     Lab Results  Component Value Date   WBC 11.2 (H) 01/22/2022   HGB 8.9 (L) 01/22/2022   HCT 27.8 (L) 01/22/2022   MCV 77.9 (L) 01/22/2022   PLT 440 (H) 01/22/2022   NEUTROABS 20.2 (H) 01/14/2022    Lab Results  Component Value Date   CEA1 5.5 (H) 01/04/2022    CT CHEST WO CONTRAST  Result Date: 01/15/2022 CLINICAL DATA:  Metastatic colon cancer. EXAM: CT CHEST WITHOUT CONTRAST TECHNIQUE: Multidetector CT imaging of the chest was performed following the standard protocol without IV contrast. RADIATION DOSE REDUCTION: This exam was performed according to the departmental dose-optimization program which includes automated exposure control, adjustment of the mA and/or kV according to patient size and/or use of iterative reconstruction technique. COMPARISON:  None Available. FINDINGS: Cardiovascular: The heart is mildly enlarged mainly due to left ventricular enlargement. No pericardial effusion. The aorta is normal in caliber. No atherosclerotic calcifications. No definite coronary artery calcifications. Mediastinum/Nodes: No mediastinal or hilar mass or lymphadenopathy. The esophagus is grossly normal. Lungs/Pleura: Streaky areas of subsegmental atelectasis. No infiltrates or effusions. No worrisome pulmonary nodules to suggest pulmonary metastatic disease. 4 mm perifissural nodule in the right middle lobe adjacent to the minor fissure is most consistent with a benign lymph node. Upper Abdomen: Scattered low-attenuation liver lesions, likely benign cysts. No upper abdominal adenopathy. Musculoskeletal: No chest wall mass. Scattered axillary nodes are likely benign/reactive. The thyroid gland is unremarkable. A right PICC line is in place. No worrisome  bone lesions. IMPRESSION: 1. No worrisome pulmonary nodules to suggest pulmonary metastatic disease. 2. No mediastinal or hilar mass or adenopathy. 3. Scattered low-attenuation liver lesions, likely benign cysts. 4. Mild cardiac enlargement mainly due to left ventricular enlargement. Electronically Signed   By: Marijo Sanes M.D.   On: 01/15/2022 13:41   [place] EKG SITE RITE  Result Date: 01/11/2022 If Site Rite image not attached, placement could not be confirmed due to current cardiac rhythm.  CT ABDOMEN PELVIS W CONTRAST  Result Date: 01/09/2022 CLINICAL DATA:  Postop abdominal pain EXAM: CT ABDOMEN AND PELVIS WITH CONTRAST TECHNIQUE: Multidetector CT imaging of the abdomen and pelvis was performed using the standard protocol following bolus administration of intravenous contrast. RADIATION DOSE REDUCTION: This exam was performed according to the departmental dose-optimization program which includes automated exposure control, adjustment of the mA and/or kV according to patient size and/or use of iterative reconstruction technique. CONTRAST:  152m OMNIPAQUE IOHEXOL 300 MG/ML  SOLN COMPARISON:  CT abdomen and pelvis dated January 03, 2022 FINDINGS: Lower chest: Small left pleural effusion and atelectasis. Hepatobiliary: Scattered low-attenuation liver lesions, too small to completely characterize, but likely simple cysts. Gallbladder is unremarkable. No biliary ductal dilation. Pancreas: Unremarkable. No pancreatic ductal dilatation or surrounding inflammatory changes. Spleen: Normal in size without focal abnormality. Adrenals/Urinary Tract: Adrenal glands are unremarkable. Kidneys are normal, without renal calculi, focal lesion, or hydronephrosis. Bladder is unremarkable. Stomach/Bowel: Interval sigmoidectomy with left upper quadrant ostomy. Multiple dilated loops of small bowel are seen. Mild wall thickening of the proximal transverse colon. Vascular/Lymphatic: No significant vascular findings are present. No  enlarged abdominal or pelvic lymph nodes. Reproductive: Mild prostatomegaly. Other: Postsurgical changes of the anterior abdominal wall. Small locules of free intraperitoneal air and trace fluid in the left hemiabdomen, likely postsurgical. Musculoskeletal: Diffuse flowing anterior osteophytes, findings can be seen in the setting of diffuse idiopathic skeletal hyperostosis. No acute or significant osseous findings. IMPRESSION: 1. Interval sigmoidectomy with left upper quadrant ostomy. Small locules of free intraperitoneal air and trace fluid in the left hemiabdomen, likely postsurgical. 2. Multiple dilated loops of small bowel, likely due to ileus. 3. Mild wall thickening of the proximal transverse colon, likely reactive. 4. Small left pleural effusion. Electronically Signed   By: Yetta Glassman M.D.   On: 01/09/2022 17:10   DG Abd Portable 1V  Result Date: 01/09/2022 CLINICAL DATA:  Ileus EXAM: PORTABLE ABDOMEN - 1 VIEW COMPARISON:  January 07, 2022 FINDINGS: Multiple air-filled dilated small bowel loops are identified in the abdomen and pelvis. There is air identified in the rectal sigmoid colon. Midline surgical staples are identified. Degenerative joint changes of the spine are noted. IMPRESSION: Findings consistent with partial small bowel obstruction or ileus. This is worsened compared prior exam. Electronically Signed   By: Abelardo Diesel M.D.   On: 01/09/2022 08:12   DG Abd 1 View  Result Date: 01/07/2022 CLINICAL DATA:  Encounter for NG tube EXAM: ABDOMEN - 1 VIEW COMPARISON:  CT 01/03/2022. FINDINGS: Nasogastric tube is coiled in the mid chest with tip coursing cephalad outside of the field of view. There is gaseous distention of bowel in the upper abdomen. Midline surgical staples noted. Low lung volumes with elevated right hemidiaphragm and right basilar atelectasis. IMPRESSION: Nasogastric tube coils in the mid chest with tip coursing cephalad outside of the field of view. Recommend  repositioning/replacement. Gaseous distension of bowel in the upper abdomen compatible with postoperative ileus. Recommend continued radiographic follow-up. These results will be called to the ordering clinician or representative by the Radiologist Assistant, and communication documented in the PACS or [employer]. Electronically Signed   By: Maurine Simmering M.D.   On: 01/07/2022 13:02   CT Abdomen Pelvis W Contrast  Result Date: 01/03/2022 CLINICAL DATA:  Bowel obstruction suspected EXAM: CT ABDOMEN AND PELVIS WITH CONTRAST TECHNIQUE: Multidetector CT imaging of the abdomen and pelvis was performed using the standard protocol following bolus administration of intravenous contrast. RADIATION DOSE REDUCTION: This exam was performed according to the departmental dose-optimization program which includes automated exposure control, adjustment of the mA and/or kV according to patient size and/or use of iterative reconstruction technique. CONTRAST:  38m OMNIPAQUE IOHEXOL 350 MG/ML SOLN COMPARISON:  None Available. FINDINGS: Lower chest: Bibasilar linear opacities, likely atelectasis. Hepatobiliary: Scattered subcentimeter hypodensities in the liver, likely small cysts. Gallbladder unremarkable. Pancreas: No focal abnormality or ductal dilatation. Spleen: No focal abnormality.  Normal size. Adrenals/Urinary Tract: No adrenal abnormality. No focal renal abnormality. No stones or hydronephrosis. Urinary bladder is unremarkable. Stomach/Bowel: Markedly  dilated small bowel diffusely. Right colon and transverse colon are dilated to the distal transverse colon/splenic flexure where there is circumferential wall thickening and mucosal enhancement. Favor colitis although cancer cannot be excluded. After this area at the splenic flexure, the distal transverse colon and sigmoid colon again become dilated. There is abrupt decompression of the sigmoid colon best seen on sagittal image 82 and axial image 80. Remainder the  rectosigmoid colon are decompressed. This areas concerning for possible tumor/malignancy. Vascular/Lymphatic: No evidence of aneurysm or adenopathy. Reproductive: No visible focal abnormality. Other: No free fluid or free air. Musculoskeletal: No acute bony abnormality. IMPRESSION: 2 areas of strictures in the colon. The 1st is at the splenic flexure with circumferential wall thickening over several cm and mucosal enhancement. This could reflect an area of colitis or malignancy. Second colonic stricture is seen in the sigmoid colon. This area is concerning for malignancy. Bowel obstruction related to these 2 colonic strictures. Small bowel is diffusely dilated, fluid-filled with air-fluid levels. Right colon and transverse colon are also dilated. Bibasilar atelectasis. Electronically Signed   By: Rolm Baptise M.D.   On: 01/03/2022 22:08     Future Appointments  Date Time Provider [place]  01/28/2022  1:40 PM Ladell Pier, MD [facility] None      LOS: 21 days

## 2022-01-24 NOTE — Progress Notes (Signed)
PROGRESS NOTE    Carl Barr  EPP:[id number] DOB: 07/19/1955 DOA: 01/03/2022 PCP: Antony Blackbird, MD   Brief Narrative: Carl Barr is a 66 y.o. male without a known medical history. He presented secondary to being unable to have bowel movements or pass gas. Imaging identified two colonic strictures with resultant bowel obstruction with concern for malignancy. General surgery was consulted and performed colectomy/colostomy. Surgical pathology identified evidence of colon adenocarcinoma. Medical oncology consulted with plans for outpatient chemotherapy.  Assessment and Plan:  Large bowel obstruction Secondary to stricture/mass. Patient is s/p left sigmoid and distal transverse hemicolectomy with end colostomy by general surgery. Stable.  Colon adenocarcinoma Diagnosed this admission. Medical oncology consulted with plans to initiate adjuvant chemotherapy.  Postoperative ileus Resolved.  High output colostomy Appears to be improved.  Moderate malnutrition Dietitian recommendations (7/19): Continue current diet as ordered, encouraged PO intake Ensure Enlive po BID, each supplement provides 350 kcal and 20 grams of protein. Continue PO vitamin regimen MVI, thiamine, and folic acid for hx of EtOH abuse  Chronic alcohol use Counseled this admission.  Obesity Body mass index is 31.22 kg/m.  DVT prophylaxis: Lovenox Code Status:   Code Status: Full Code Family Communication: None at bedside Disposition Plan: Discharge to SNF pending bed availability. Medically stable for discharge.   Consultants:  General surgery Medical oncology  Procedures:  LEFT, SIGMOID, AND DISTAL TRANSVERSE COLECTOMY; MOBILIZATION OF SPLENIC FLEXURE; COLOSTOMY (6/30) PICC line (7/7>>7/18)  Antimicrobials: None    Subjective: No concerns this morning. Feels well.  Objective: BP 113/70 (BP Location: Left Arm)   Pulse 100   Temp 98.1 F (36.7 C) (Oral)   Resp 17   Ht '5\' 9"'$  (1.753 m)    Wt 95.9 kg   SpO2 99%   BMI 31.22 kg/m   Examination:  General exam: Appears calm and comfortable Respiratory system: Clear to auscultation. Respiratory effort normal. Cardiovascular system: S1 & S2 heard, RRR. Gastrointestinal system: Abdomen is distended, soft and non-tender. Normal bowel sounds heard. Central nervous system: Alert and oriented. No focal neurological deficits. Musculoskeletal: No calf tenderness Skin: No cyanosis. No rashes Psychiatry: Judgement and insight appear normal. Mood & affect appropriate.    Data Reviewed: I have personally reviewed following labs and imaging studies  CBC Lab Results  Component Value Date   WBC 11.2 (H) 01/22/2022   RBC 3.57 (L) 01/22/2022   HGB 8.9 (L) 01/22/2022   HCT 27.8 (L) 01/22/2022   MCV 77.9 (L) 01/22/2022   MCH 24.9 (L) 01/22/2022   PLT 440 (H) 01/22/2022   MCHC 32.0 01/22/2022   RDW 18.2 (H) 01/22/2022   LYMPHSABS 1.7 01/14/2022   MONOABS 5.5 (H) 01/14/2022   EOSABS 0.3 01/14/2022   BASOSABS 0.0 60/63/0160     Last metabolic panel Lab Results  Component Value Date   NA 134 (L) 01/22/2022   K 3.3 (L) 01/22/2022   CL 108 01/22/2022   CO2 23 01/22/2022   BUN 5 (L) 01/22/2022   CREATININE 0.45 (L) 01/22/2022   GLUCOSE 106 (H) 01/22/2022   GFRNONAA >60 01/22/2022   GFRAA >60 08/01/2019   CALCIUM 8.6 (L) 01/22/2022   PHOS 3.7 01/20/2022   PROT 7.3 01/20/2022   ALBUMIN 2.0 (L) 01/20/2022   LABGLOB 3.9 12/16/2018   AGRATIO 1.1 (L) 12/16/2018   BILITOT 0.4 01/20/2022   ALKPHOS 58 01/20/2022   AST 22 01/20/2022   ALT 41 01/20/2022   ANIONGAP 3 (L) 01/22/2022    GFR: Estimated Creatinine Clearance: 103.8 mL/min (  A) (by C-G formula based on SCr of 0.45 mg/dL (L)).  No results found for this or any previous visit (from the past 240 hour(s)).    Radiology Studies: No results found.    LOS: 21 days    Cordelia Poche, MD [facility] 01/24/2022, 9:12 AM   If 7PM-7AM, please contact  night-coverage [email or web address]

## 2022-01-24 NOTE — Progress Notes (Signed)
Mobility Specialist Progress Note:   01/24/22 1511  Mobility  Activity Transferred from bed to chair  Level of Assistance Minimal assist, patient does 75% or more  Assistive Device Front wheel walker  Distance Ambulated (ft) 2 ft  Activity Response Tolerated well  $Mobility charge 1 Mobility   Pt reluctant to mobility session. Required MinA to stand and take pivotal steps to chair. Further mobility declined by pt. Left in chair with call bell and chair alarm on.   Icyss Skog Mobility Specialist-Acute Rehab Secure Chat only

## 2022-01-25 ENCOUNTER — Other Ambulatory Visit (HOSPITAL_COMMUNITY): Payer: Self-pay

## 2022-01-25 DIAGNOSIS — K56609 Unspecified intestinal obstruction, unspecified as to partial versus complete obstruction: Secondary | ICD-10-CM | POA: Diagnosis not present

## 2022-01-25 MED ORDER — FOLIC ACID 1 MG PO TABS: 1.0000 mg | ORAL_TABLET | Freq: Every day | ORAL | Status: DC

## 2022-01-25 MED ORDER — ENSURE ENLIVE PO LIQD
237.0000 mL | Freq: Two times a day (BID) | ORAL | 12 refills | Status: DC
  Filled 2022-01-25: qty 237, 1d supply, fill #0

## 2022-01-25 MED ORDER — ADULT MULTIVITAMIN W/MINERALS CH: 1.0000 | ORAL_TABLET | Freq: Every day | ORAL | Status: DC

## 2022-01-25 MED ORDER — HYDROCERIN EX CREA
TOPICAL_CREAM | Freq: Two times a day (BID) | CUTANEOUS | Status: DC
Start: 2022-01-25 — End: 2022-01-28
  Administered 2022-01-26: 1 via TOPICAL
  Filled 2022-01-25 (×2): qty 113

## 2022-01-25 MED ORDER — HYDROCERIN EX CREA: 1.0000 | TOPICAL_CREAM | Freq: Two times a day (BID) | CUTANEOUS | 0 refills | Status: AC

## 2022-01-25 MED ORDER — THIAMINE HCL 100 MG PO TABS: 100.0000 mg | ORAL_TABLET | Freq: Every day | ORAL | Status: DC

## 2022-01-25 MED ORDER — ENSURE ENLIVE PO LIQD: 237.0000 mL | Freq: Two times a day (BID) | ORAL | 12 refills | Status: DC

## 2022-01-25 NOTE — Discharge Summary (Signed)
Physician Discharge Summary   Patient: Carl Barr MRN: [id number] DOB: 08/10/1955  Admit date:     01/03/2022  Discharge date: 01/28/22  Discharge Physician: Cordelia Poche, MD   PCP: Antony Blackbird, MD   Recommendations at discharge:  PCP follow-up Patient needs to follow-up with general surgery for post-op management of open abdominal wall wound Patient needs to follow-up with medical oncology for initiation of therapy for newly diagnosed adenocarcinoma of the colon  Discharge Diagnoses: Principal Problem:   Large bowel obstruction ([place]) Active Problems:   Open abdominal wall wound   Hypokalemia   Poor social situation   Adenocarcinoma of colon ([place])   Alcohol use   Hypomagnesemia   Hypophosphatemia   Malnutrition of moderate degree  Resolved Problems:   * No resolved hospital problems. *  Hospital Course: Carl Barr is a 67 y.o. male without a known medical history. He presented secondary to being unable to have bowel movements or pass gas. Imaging identified two colonic strictures with resultant bowel obstruction with concern for malignancy. General surgery was consulted and performed colectomy/colostomy. Surgical pathology identified evidence of colon adenocarcinoma. Medical oncology consulted with plans for outpatient chemotherapy.   Assessment and Plan:  Large bowel obstruction Secondary to stricture/mass. Patient is s/p left sigmoid and distal transverse hemicolectomy with end colostomy by general surgery. Stable.   Colon adenocarcinoma Diagnosed this admission. Medical oncology consulted with plans to initiate adjuvant chemotherapy.   Postoperative ileus Resolved.  Hypokalemia Supplementation given.   High output colostomy Appears to be improved. Resolved.   Moderate malnutrition Dietitian recommendations (7/19): Continue current diet as ordered, encouraged PO intake Ensure Enlive po BID, each supplement provides 350 kcal and 20 grams of  protein. Continue PO vitamin regimen MVI, thiamine, and folic acid for hx of EtOH abuse   Chronic alcohol use Counseled this admission.   Obesity Body mass index is 31.22 kg/m.   Consultants: General surgery; Medical oncology Procedures performed: LEFT, SIGMOID, AND DISTAL TRANSVERSE COLECTOMY; MOBILIZATION OF SPLENIC FLEXURE; COLOSTOMY (6/30); PICC line (7/7>>7/18)  Disposition: Skilled nursing facility Diet recommendation: Soft diet   DISCHARGE MEDICATION: Allergies as of 01/25/2022   No Known Allergies      Medication List     TAKE these medications    feeding supplement Liqd Take 237 mLs by mouth 2 (two) times daily between meals. Start taking on: January 27, 2439   folic acid 1 MG tablet Commonly known as: FOLVITE Take 1 tablet (1 mg total) by mouth daily. Start taking on: January 26, 2022   hydrocerin Crea Apply 1 Application topically 2 (two) times daily.   multivitamin with minerals Tabs tablet Take 1 tablet by mouth daily. Start taking on: January 26, 2022   thiamine 100 MG tablet Take 1 tablet (100 mg total) by mouth daily. Start taking on: January 26, 2022               Durable Medical Equipment  (From admission, onward)           Start     Ordered   01/10/22 1723  For home use only DME Bedside commode  Once       Question:  Patient needs a bedside commode to treat with the following condition  Answer:  Generalized muscle weakness   01/10/22 1722   01/10/22 1723  For home use only DME 3 n 1  Once        01/10/22 1722   01/10/22 1722  For home use only DME [place] Rile  rolling  Once       Question Answer Comment  Walker: With [place]   Patient needs a walker to treat with the following condition Generalized muscle weakness      01/10/22 1722              Discharge Care Instructions  (From admission, onward)           Start     Ordered   01/25/22 0000  Discharge wound care:       Comments: Cleanse with NS, pat gently dry. Fill  defects with saline moistened roll gauze, top with dry gauze, ABD pads and secure with tape. Change PRN for soiling, otherwise twice daily.   01/25/22 1343            Follow-up Information     [facility] Follow up.   Specialty: Home Health Services Contact information: [place] [id number] [phone]         Georganna Skeans, MD. Call in 3 week(s).   Specialty: General Surgery Why: Please call to confirm your appointment date and time. Please bring a copy of your photo ID and insurance card. Please arrive 30 minutes prior to your appointment for paperwork. Contact information: [address] [place] [address] [phone]                Discharge Exam: BP 132/77 (BP Location: Left Arm)   Pulse 87   Temp 98.6 F (37 C) (Oral)   Resp 20   Ht '5\' 9"'$  (1.753 m)   Wt 98.3 kg   SpO2 100%   BMI 32.00 kg/m   General exam: Appears calm and comfortable Respiratory system: Clear to auscultation. Respiratory effort normal. Cardiovascular system: S1 & S2 heard, RRR. No murmurs, rubs, gallops or clicks. Gastrointestinal system: Abdomen is distended, soft and non-tender. Normal bowel sounds heard. Central nervous system: Alert and oriented. No focal neurological deficits. Musculoskeletal: BLE ankle edema. No calf tenderness Skin: Dry Psychiatry: Judgement and insight appear normal. Mood & affect appropriate.   Condition at discharge: stable  The results of significant diagnostics from this hospitalization (including imaging, microbiology, ancillary and laboratory) are listed below for reference.   Imaging Studies: CT CHEST WO CONTRAST  Result Date: 01/15/2022 CLINICAL DATA:  Metastatic colon cancer. EXAM: CT CHEST WITHOUT CONTRAST TECHNIQUE: Multidetector CT imaging of the chest was performed following the standard protocol without IV contrast. RADIATION DOSE REDUCTION: This exam was performed according to the  departmental dose-optimization program which includes automated exposure control, adjustment of the mA and/or kV according to patient size and/or use of iterative reconstruction technique. COMPARISON:  None Available. FINDINGS: Cardiovascular: The heart is mildly enlarged mainly due to left ventricular enlargement. No pericardial effusion. The aorta is normal in caliber. No atherosclerotic calcifications. No definite coronary artery calcifications. Mediastinum/Nodes: No mediastinal or hilar mass or lymphadenopathy. The esophagus is grossly normal. Lungs/Pleura: Streaky areas of subsegmental atelectasis. No infiltrates or effusions. No worrisome pulmonary nodules to suggest pulmonary metastatic disease. 4 mm perifissural nodule in the right middle lobe adjacent to the minor fissure is most consistent with a benign lymph node. Upper Abdomen: Scattered low-attenuation liver lesions, likely benign cysts. No upper abdominal adenopathy. Musculoskeletal: No chest wall mass. Scattered axillary nodes are likely benign/reactive. The thyroid gland is unremarkable. A right PICC line is in place. No worrisome bone lesions. IMPRESSION: 1. No worrisome pulmonary nodules to suggest pulmonary metastatic disease. 2. No  mediastinal or hilar mass or adenopathy. 3. Scattered low-attenuation liver lesions, likely benign cysts. 4. Mild cardiac enlargement mainly due to left ventricular enlargement. Electronically Signed   By: Marijo Sanes M.D.   On: 01/15/2022 13:41   [place] EKG SITE RITE  Result Date: 01/11/2022 If Site Rite image not attached, placement could not be confirmed due to current cardiac rhythm.  CT ABDOMEN PELVIS W CONTRAST  Result Date: 01/09/2022 CLINICAL DATA:  Postop abdominal pain EXAM: CT ABDOMEN AND PELVIS WITH CONTRAST TECHNIQUE: Multidetector CT imaging of the abdomen and pelvis was performed using the standard protocol following bolus administration of intravenous contrast. RADIATION DOSE REDUCTION: This exam  was performed according to the departmental dose-optimization program which includes automated exposure control, adjustment of the mA and/or kV according to patient size and/or use of iterative reconstruction technique. CONTRAST:  13m OMNIPAQUE IOHEXOL 300 MG/ML  SOLN COMPARISON:  CT abdomen and pelvis dated January 03, 2022 FINDINGS: Lower chest: Small left pleural effusion and atelectasis. Hepatobiliary: Scattered low-attenuation liver lesions, too small to completely characterize, but likely simple cysts. Gallbladder is unremarkable. No biliary ductal dilation. Pancreas: Unremarkable. No pancreatic ductal dilatation or surrounding inflammatory changes. Spleen: Normal in size without focal abnormality. Adrenals/Urinary Tract: Adrenal glands are unremarkable. Kidneys are normal, without renal calculi, focal lesion, or hydronephrosis. Bladder is unremarkable. Stomach/Bowel: Interval sigmoidectomy with left upper quadrant ostomy. Multiple dilated loops of small bowel are seen. Mild wall thickening of the proximal transverse colon. Vascular/Lymphatic: No significant vascular findings are present. No enlarged abdominal or pelvic lymph nodes. Reproductive: Mild prostatomegaly. Other: Postsurgical changes of the anterior abdominal wall. Small locules of free intraperitoneal air and trace fluid in the left hemiabdomen, likely postsurgical. Musculoskeletal: Diffuse flowing anterior osteophytes, findings can be seen in the setting of diffuse idiopathic skeletal hyperostosis. No acute or significant osseous findings. IMPRESSION: 1. Interval sigmoidectomy with left upper quadrant ostomy. Small locules of free intraperitoneal air and trace fluid in the left hemiabdomen, likely postsurgical. 2. Multiple dilated loops of small bowel, likely due to ileus. 3. Mild wall thickening of the proximal transverse colon, likely reactive. 4. Small left pleural effusion. Electronically Signed   By: LYetta GlassmanM.D.   On: 01/09/2022  17:10   DG Abd Portable 1V  Result Date: 01/09/2022 CLINICAL DATA:  Ileus EXAM: PORTABLE ABDOMEN - 1 VIEW COMPARISON:  January 07, 2022 FINDINGS: Multiple air-filled dilated small bowel loops are identified in the abdomen and pelvis. There is air identified in the rectal sigmoid colon. Midline surgical staples are identified. Degenerative joint changes of the spine are noted. IMPRESSION: Findings consistent with partial small bowel obstruction or ileus. This is worsened compared prior exam. Electronically Signed   By: WAbelardo DieselM.D.   On: 01/09/2022 08:12   DG Abd 1 View  Result Date: 01/07/2022 CLINICAL DATA:  Encounter for NG tube EXAM: ABDOMEN - 1 VIEW COMPARISON:  CT 01/03/2022. FINDINGS: Nasogastric tube is coiled in the mid chest with tip coursing cephalad outside of the field of view. There is gaseous distention of bowel in the upper abdomen. Midline surgical staples noted. Low lung volumes with elevated right hemidiaphragm and right basilar atelectasis. IMPRESSION: Nasogastric tube coils in the mid chest with tip coursing cephalad outside of the field of view. Recommend repositioning/replacement. Gaseous distension of bowel in the upper abdomen compatible with postoperative ileus. Recommend continued radiographic follow-up. These results will be called to the ordering clinician or representative by the Radiologist Assistant, and communication documented in the PACS or  [facility] Dashboard. Electronically Signed   By: Maurine Simmering M.D.   On: 01/07/2022 13:02   CT Abdomen Pelvis W Contrast  Result Date: 01/03/2022 CLINICAL DATA:  Bowel obstruction suspected EXAM: CT ABDOMEN AND PELVIS WITH CONTRAST TECHNIQUE: Multidetector CT imaging of the abdomen and pelvis was performed using the standard protocol following bolus administration of intravenous contrast. RADIATION DOSE REDUCTION: This exam was performed according to the departmental dose-optimization program which includes automated exposure control,  adjustment of the mA and/or kV according to patient size and/or use of iterative reconstruction technique. CONTRAST:  55m OMNIPAQUE IOHEXOL 350 MG/ML SOLN COMPARISON:  None Available. FINDINGS: Lower chest: Bibasilar linear opacities, likely atelectasis. Hepatobiliary: Scattered subcentimeter hypodensities in the liver, likely small cysts. Gallbladder unremarkable. Pancreas: No focal abnormality or ductal dilatation. Spleen: No focal abnormality.  Normal size. Adrenals/Urinary Tract: No adrenal abnormality. No focal renal abnormality. No stones or hydronephrosis. Urinary bladder is unremarkable. Stomach/Bowel: Markedly dilated small bowel diffusely. Right colon and transverse colon are dilated to the distal transverse colon/splenic flexure where there is circumferential wall thickening and mucosal enhancement. Favor colitis although cancer cannot be excluded. After this area at the splenic flexure, the distal transverse colon and sigmoid colon again become dilated. There is abrupt decompression of the sigmoid colon best seen on sagittal image 82 and axial image 80. Remainder the rectosigmoid colon are decompressed. This areas concerning for possible tumor/malignancy. Vascular/Lymphatic: No evidence of aneurysm or adenopathy. Reproductive: No visible focal abnormality. Other: No free fluid or free air. Musculoskeletal: No acute bony abnormality. IMPRESSION: 2 areas of strictures in the colon. The 1st is at the splenic flexure with circumferential wall thickening over several cm and mucosal enhancement. This could reflect an area of colitis or malignancy. Second colonic stricture is seen in the sigmoid colon. This area is concerning for malignancy. Bowel obstruction related to these 2 colonic strictures. Small bowel is diffusely dilated, fluid-filled with air-fluid levels. Right colon and transverse colon are also dilated. Bibasilar atelectasis. Electronically Signed   By: KRolm BaptiseM.D.   On: 01/03/2022 22:08     Microbiology: Results for orders placed or performed during the hospital encounter of 01/03/22  C Difficile Quick Screen (NO PCR Reflex)     Status: None   Collection Time: 01/13/22  9:19 AM   Specimen: STOOL  Result Value Ref Range Status   C Diff antigen NEGATIVE NEGATIVE Final   C Diff toxin NEGATIVE NEGATIVE Final   C Diff interpretation No C. difficile detected.  Final    Comment: Performed at [facility] [address] [place] [id number]   Labs: CBC: Recent Labs  Lab 01/19/22 0308 01/20/22 0415 01/21/22 0206 01/22/22 0433  WBC 13.1* 12.1* 12.2* 11.2*  HGB 8.7* 8.9* 8.8* 8.9*  HCT 26.6* 27.6* 27.4* 27.8*  MCV 77.1* 78.2* 77.2* 77.9*  PLT 496* 496* 480* 4829   Basic Metabolic Panel: Recent Labs  Lab 01/19/22 0308 01/20/22 0415 01/21/22 0206 01/22/22 0433  NA 132* 131* 132* 134*  K 3.7 3.8 3.4* 3.3*  CL 100 103 101 108  CO2 '22 23 23 23  '$ GLUCOSE 107* 100* 101* 106*  BUN 7* 6* 7* 5*  CREATININE 0.58* 0.49* 0.54* 0.45*  CALCIUM 8.7* 8.7* 8.7* 8.6*  MG  --  2.0 1.8  --   PHOS  --  3.7  --   --    Liver Function Tests: Recent Labs  Lab 01/20/22 0415  AST 22  ALT 41  ALKPHOS 58  BILITOT 0.4  PROT 7.3  ALBUMIN 2.0*   Discharge time spent:  35 minutes.  Signed: Cordelia Poche, MD [facility] 01/25/2022

## 2022-01-25 NOTE — Discharge Instructions (Signed)
Wet to Dry WOUND CARE: - Change dressing twice daily - Supplies: sterile saline, kerlex, scissors, ABD pads, tape  Remove dressing and all packing carefully, moistening with sterile saline as needed to avoid packing/internal dressing sticking to the wound. 2.   Clean edges of skin around the wound with water/gauze, making sure there is no tape debris or leakage left on skin that could cause skin irritation or breakdown. 3.   Dampen and clean kerlex with sterile saline and pack wound from wound base to skin level, making sure to take note of any possible areas of wound tracking, tunneling and packing appropriately. Wound can be packed loosely. Trim kerlex to size if a whole kerlex is not required. 4.   Cover wound with a dry ABD pad and secure with tape.  5.   Write the date/time on the dry dressing/tape to better track when the last dressing change occurred. - apply any skin protectant/powder if recommended by clinician to protect skin/skin folds. - change dressing as needed if leakage occurs, wound gets contaminated, or patient requests to shower. - You may shower daily with wound open and following the shower the wound should be dried and a clean dressing placed.  - Medical grade tape as well as packing supplies can be found at [facility] on [address] or [facility] on [address]. The remaining supplies can be found at your local drug store, [employer] etc.  [facility]      [facility] [phone]  OPEN ABDOMINAL SURGERY: POST OP INSTRUCTIONS  Always review your discharge instruction sheet given to you by the facility where your surgery was performed.  IF YOU HAVE DISABILITY OR FAMILY LEAVE FORMS, YOU MUST BRING THEM TO THE OFFICE FOR PROCESSING.  PLEASE DO NOT GIVE THEM TO YOUR DOCTOR.  A prescription for pain medication may be given to you upon discharge.  Take your pain medication as prescribed, if needed.  If narcotic pain medicine is not needed,  then you may take acetaminophen (Tylenol) or ibuprofen (Advil) as needed. Take your usually prescribed medications unless otherwise directed. If you need a refill on your pain medication, please contact your pharmacy. They will contact our office to request authorization.  Prescriptions will not be filled after 5pm or on week-ends. You should follow a light diet the first few days after arrival home, such as soup and crackers, pudding, etc.unless your doctor has advised otherwise. A high-fiber, low fat diet can be resumed as tolerated.   Be sure to include lots of fluids daily. Most patients will experience some swelling and bruising on the chest and neck area.  Ice packs will help.  Swelling and bruising can take several days to resolve Most patients will experience some swelling and bruising in the area of the incision. Ice pack will help. Swelling and bruising can take several days to resolve..  It is common to experience some constipation if taking pain medication after surgery.  Increasing fluid intake and taking a stool softener will usually help or prevent this problem from occurring.  A mild laxative (Milk of Magnesia or Miralax) should be taken according to package directions if there are no bowel movements after 48 hours.  You may have steri-strips (small skin tapes) in place directly over the incision.  These strips should be left on the skin for 7-10 days.  If your surgeon used skin glue on the incision, you may shower in 24 hours.  The glue will flake off over the   next 2-3 weeks.  Any sutures or staples will be removed at the office during your follow-up visit. You may find that a light gauze bandage over your incision may keep your staples from being rubbed or pulled. You may shower and replace the bandage daily. ACTIVITIES:  You may resume regular (light) daily activities beginning the next day--such as daily self-care, walking, climbing stairs--gradually increasing activities as tolerated.   You may have sexual intercourse when it is comfortable.  Refrain from any heavy lifting or straining until approved by your doctor. You may drive when you no longer are taking prescription pain medication, you can comfortably wear a seatbelt, and you can safely maneuver your car and apply brakes Return to Work: ___________________________________ You should see your doctor in the office for a follow-up appointment approximately two weeks after your surgery.  Make sure that you call for this appointment within a day or two after you arrive home to insure a convenient appointment time. OTHER INSTRUCTIONS:  _____________________________________________________________ _____________________________________________________________  WHEN TO CALL YOUR DOCTOR: Fever over 101.0 Inability to urinate Nausea and/or vomiting Extreme swelling or bruising Continued bleeding from incision. Increased pain, redness, or drainage from the incision. Difficulty swallowing or breathing Muscle cramping or spasms. Numbness or tingling in hands or feet or around lips.  The clinic staff is available to answer your questions during regular business hours.  Please don't hesitate to call and ask to speak to one of the nurses if you have concerns.  For further questions, please visit [email or web address]   

## 2022-01-25 NOTE — Progress Notes (Signed)
21 Days Post-Op  Subjective: CC: Reports he is doing well. No abdominal pain, n/v. Finishing most to all of his trays of food. Having ostomy output. Working with therapies (recommended for SNF). Voiding.   Objective: Vital signs in last 24 hours: Temp:  [98.2 F (36.8 C)-98.9 F (37.2 C)] 98.6 F (37 C) (07/21 0818) Pulse Rate:  [81-101] 87 (07/21 0818) Resp:  [15-20] 20 (07/21 0818) BP: (110-132)/(68-77) 132/77 (07/21 0818) SpO2:  [99 %-100 %] 100 % (07/21 0818) Weight:  [98.3 kg] 98.3 kg (07/21 0435) Last BM Date : 01/23/22  Intake/Output from previous day: 07/20 0701 - 07/21 0700 In: 1610.8 [P.O.:1060; I.V.:550.8] Out: 2300 [Urine:1500; Stool:800] Intake/Output this shift: No intake/output data recorded.  PE: Gen:  Alert, NAD, pleasant Abd: Soft, ND, NT, +BS, ostomy bag with liquid stool in bag. Unable to see stoma 2/2 stool. Midline wound - see picture below. Staples out. Inferior aspect of wound open and is ~5cm deep with some fibrinous tissue at the base but otherwise with healthy granulation tissue on the sides. This undermines superiorly below the skin ~ 7cm. There is 2 small areas of skin separation along the middle of his incision (see picture below). No drainage from this area. Does not appears to tract. Remainder of the wound cdi without surrounding skin changes    Lab Results:  No results for input(s): "WBC", "HGB", "HCT", "PLT" in the last 72 hours. BMET No results for input(s): "NA", "K", "CL", "CO2", "GLUCOSE", "BUN", "CREATININE", "CALCIUM" in the last 72 hours. PT/INR No results for input(s): "LABPROT", "INR" in the last 72 hours. CMP     Component Value Date/Time   NA 134 (L) 01/22/2022 0433   NA 139 12/16/2018 1142   K 3.3 (L) 01/22/2022 0433   CL 108 01/22/2022 0433   CO2 23 01/22/2022 0433   GLUCOSE 106 (H) 01/22/2022 0433   BUN 5 (L) 01/22/2022 0433   BUN 15 12/16/2018 1142   CREATININE 0.45 (L) 01/22/2022 0433   CALCIUM 8.6 (L) 01/22/2022  0433   PROT 7.3 01/20/2022 0415   PROT 8.0 12/16/2018 1142   ALBUMIN 2.0 (L) 01/20/2022 0415   ALBUMIN 4.1 12/16/2018 1142   AST 22 01/20/2022 0415   ALT 41 01/20/2022 0415   ALKPHOS 58 01/20/2022 0415   BILITOT 0.4 01/20/2022 0415   BILITOT 0.4 12/16/2018 1142   GFRNONAA >60 01/22/2022 0433   GFRAA >60 08/01/2019 0654   Lipase     Component Value Date/Time   LIPASE 29 01/03/2022 1714    Studies/Results: No results found.  Anti-infectives: Anti-infectives (From admission, onward)    Start     Dose/Rate Route Frequency Ordered Stop   01/04/22 0830  cefoTEtan (CEFOTAN) 2 g in sodium chloride 0.9 % 100 mL IVPB        2 g 200 mL/hr over 30 Minutes Intravenous  Once 01/04/22 0805 01/04/22 1010        Assessment/Plan POD#21 s/p open extended left hemicolectomy with end colostomy for a colonic obstruction secondary to sigmoid mass and thickening of splenic flexure by Dr. Grandville Silos on 01/04/22 - Surgical pathology shows a well-differentiated colon adenocarcinoma, stage [id number]. Chest CT shows no evidence of metastatic disease. Will need chemotherapy once recovered, medical oncology (Dr. Benay Spice) saw has seen the patient. They are scheduling outpatient f/u.  - CT 7/5: with ileus but no abscess or leak noted, mild wall thickening of transverse colon - Large volume stool from ostomy - C. Diff neg. Outpt improving  compared to last week, and now stable at ~800-1012m/day. Do not recommend immodium given prior ileus and ongoing distension. Continue to monitor. - TPN off. Continue soft diet. Shakes. - BID WTD to inferior aspect of wound making sure to pack area of undermining. Staples out 7/17. Monitor.  - Mobilize, PT following. Have discussed multiple times with patient the importance of mobility. PT recommending SNF   FEN: Soft diet, esnure, IVF per TRH VTE: SCDs, LMWH ID: None currently. Afebrile. Leukocytosis likely secondary to asplenia (stable to improved - last checked 7/18).   Foley: External Dispo: SNF. Stable from surgical standpoint for discharge. Will arrange follow up. We will see again Monday if patient remains in house. Please call with questions or concerns over the weekend.   LOS: 22 days    MJillyn Ledger, PWeather[facility] Hospital[facility] 01/25/2022, 9:14 AM Please see Amion for pager number during day hours 7:00am-4:30pm

## 2022-01-25 NOTE — Progress Notes (Addendum)
Physical Therapy Treatment Patient Details Name: Carl Barr MRN: [id number] DOB: February 21, 1956 Today's Date: 01/25/2022   History of Present Illness 66 year old male without any past medical history presents to the ER with complaints of constipation with nausea and vomiting.  Found to have bowel obstruction with colonic stricture concerning for possible malignancy.  S/P Left sigmoid and distal transverse colectomy and colostomy creation on 6/30.  7/3, developed ileus with nausea and vomiting.  Continues to have output from the colostomy bag.  Unable to insert NG x2.    PT Comments    Pt up in chair and agreeable to ambulate in hallway. Pt with reciprocal pattern, no LOB, and fair endurance. Pt will continue to benefit from skilled, acute care physical therapy interventions to maximize his current level of function and independence level.  Recommendations for follow up therapy are one component of a multi-disciplinary discharge planning process, led by the attending physician.  Recommendations may be updated based on patient status, additional functional criteria and insurance authorization.  Follow Up Recommendations  Skilled nursing-short term rehab (<3 hours/day) Can patient physically be transported by private vehicle: Yes   Assistance Recommended at Discharge Intermittent Supervision/Assistance  Patient can return home with the following A little help with bathing/dressing/bathroom;Assistance with cooking/housework;Direct supervision/assist for medications management;Assist for transportation;Help with stairs or ramp for entrance;A lot of help with walking and/or transfers   Equipment Recommendations  Rolling walker (2 wheels);BSC/3in1    Recommendations for Other Services       Precautions / Restrictions Precautions Precautions: Fall Precaution Comments: colostomy Restrictions Weight Bearing Restrictions: No     Mobility  Bed Mobility                     Transfers Overall transfer level: Needs assistance Equipment used: Rolling walker (2 wheels) Transfers: Sit to/from Stand Sit to Stand: Min assist, Min guard           General transfer comment: Cues for hand placements and safety awareness. CGA utilized from chair and min assist from low surface within hallway between gait trials.    Ambulation/Gait Ambulation/Gait assistance: Min guard Gait Distance (Feet): 170 Feet Assistive device: Rolling walker (2 wheels) Gait Pattern/deviations: Step-through pattern, Trunk flexed, Wide base of support Gait velocity: decreased     General Gait Details: Pt ambulated 85 feet x 2 with seated rest break between. Pt more aware of his flexed positioning this session.   Stairs             Wheelchair Mobility    Modified Rankin (Stroke Patients Only)       Balance Overall balance assessment: Needs assistance Sitting-balance support: No upper extremity supported, Feet supported Sitting balance-Leahy Scale: Fair     Standing balance support: Bilateral upper extremity supported Standing balance-Leahy Scale: Poor Standing balance comment: relies on UE support                            Cognition Arousal/Alertness: Awake/alert Behavior During Therapy: Flat affect Overall Cognitive Status: Impaired/Different from baseline Area of Impairment: Following commands, Safety/judgement, Problem solving, Memory                     Memory: Decreased short-term memory, Decreased recall of precautions Following Commands: Follows one step commands with increased time Safety/Judgement: Decreased awareness of safety, Decreased awareness of deficits   Problem Solving: Difficulty sequencing, Requires verbal cues, Decreased initiation, Slow processing General Comments: Pt confused  and has difficulty making his needs clear.        Exercises      General Comments        Pertinent Vitals/Pain Pain Assessment Pain  Assessment: No/denies pain    Home Living                          Prior Function            PT Goals (current goals can now be found in the care plan section) Acute Rehab PT Goals Patient Stated Goal: to go home PT Goal Formulation: With patient Time For Goal Achievement: 01/29/22 Potential to Achieve Goals: Good Progress towards PT goals: Progressing toward goals    Frequency    Min 3X/week      PT Plan Current plan remains appropriate    Co-evaluation              AM-PAC PT "6 Clicks" Mobility   Outcome Measure  Help needed turning from your back to your side while in a flat bed without using bedrails?: A Little Help needed moving from lying on your back to sitting on the side of a flat bed without using bedrails?: A Little Help needed moving to and from a bed to a chair (including a wheelchair)?: A Little Help needed standing up from a chair using your arms (e.g., wheelchair or bedside chair)?: A Little Help needed to walk in hospital room?: A Little Help needed climbing 3-5 steps with a railing? : A Lot 6 Click Score: 17    End of Session Equipment Utilized During Treatment: Gait belt Activity Tolerance: Patient limited by fatigue;Patient tolerated treatment well Patient left: with call bell/phone within reach;in chair;with chair alarm set Nurse Communication: Mobility status PT Visit Diagnosis: Unsteadiness on feet (R26.81);Muscle weakness (generalized) (M62.81)     Time: 7035-0093 PT Time Calculation (min) (ACUTE ONLY): 15 min  Charges:  $Gait Training: 8-22 mins                     Donna Bernard, PT    [facility] 01/25/2022, 11:46 AM

## 2022-01-25 NOTE — Progress Notes (Signed)
RN called [facility] back and spoke with Wynell Balloon, RN and the admissions coordinator who stated that they did not have a bed so he would be staying at the hospital.

## 2022-01-25 NOTE — Progress Notes (Signed)
RN called [facility] to give report and they stated that they were not receiving the patient today. RN notified SW waiting for update

## 2022-01-25 NOTE — NC FL2 (Signed)
[place] LEVEL OF CARE SCREENING TOOL     IDENTIFICATION  Patient Name: Carl Barr Birthdate: March 14, 1956 Sex: male Admission Date (Current Location): 01/03/2022  [facility] and [place] Number:  Herbalist and Address:  The [facility], [address] [place], [place] [address]      Provider Number: [id number]  Attending Physician Name and Address:  Mariel Aloe, MD  Relative Name and Phone Number:       Current Level of Care: Hospital Recommended Level of Care: [place] Prior Approval Number:    Date Approved/Denied:   PASRR Number: [id number]  Discharge Plan: SNF    Current Diagnoses: Patient Active Problem List   Diagnosis Date Noted   Adenocarcinoma of colon ([place]) 01/15/2022   Malnutrition of moderate degree 01/11/2022   Open abdominal wall wound 01/11/2022   Poor social situation 01/10/2022   Hypomagnesemia 01/09/2022   Hypophosphatemia 01/09/2022   Alcohol use 01/04/2022   Hypokalemia 01/04/2022   Large bowel obstruction ([place]) 01/03/2022   Lumbar degenerative disc disease 12/16/2018   Chronic pain of left knee 12/16/2018   Encounter for long-term (current) use of medications 12/16/2018    Orientation RESPIRATION BLADDER Height & Weight     Self, Time, Situation, Place  Normal Incontinent, External catheter Weight: 216 lb 11.4 oz (98.3 kg) Height:  '5\' 9"'$  (175.3 cm)  BEHAVIORAL SYMPTOMS/MOOD NEUROLOGICAL BOWEL NUTRITION STATUS      Continent, Colostomy Diet (See DC Summary)  AMBULATORY STATUS COMMUNICATION OF NEEDS Skin   Limited Assist Verbally Surgical wounds (Closed incision on abdomen)                       Personal Care Assistance Level of Assistance  Bathing, Feeding, Dressing Bathing Assistance: Maximum assistance Feeding assistance: Independent Dressing Assistance: Limited assistance     Functional Limitations Info             [place]  PT (By  licensed PT), OT (By licensed OT)     PT Frequency: 5x/week OT Frequency: 5x/week            Contractures Contractures Info: Not present    Additional Factors Info  Code Status, Allergies, Insulin Sliding Scale Code Status Info: Full Allergies Info: NKA   Insulin Sliding Scale Info: See dc summary       Current Medications (01/25/2022):  This is the current hospital active medication list Current Facility-Administered Medications  Medication Dose Route Frequency Provider Last Rate Last Admin   acetaminophen (TYLENOL) tablet 650 mg  650 mg Oral Q6H PRN Maczis, Barth Kirks, PA-C       Chlorhexidine Gluconate Cloth 2 % PADS 6 each  6 each Topical Daily Lavina Hamman, MD   6 each at 01/25/22 0836   enoxaparin (LOVENOX) injection 40 mg  40 mg Subcutaneous Q24H Lavina Hamman, MD   40 mg at 01/25/22 1144   feeding supplement (ENSURE ENLIVE / ENSURE PLUS) liquid 237 mL  237 mL Oral BID BM Lavina Hamman, MD   237 mL at 00/76/22 6333   folic acid (FOLVITE) tablet 1 mg  1 mg Oral Daily Lavina Hamman, MD   1 mg at 01/25/22 5456   lactated ringers infusion   Intravenous Continuous Donnamae Jude, RPH 50 mL/hr at 01/24/22 1501 Infusion Verify at 01/24/22 1501   methocarbamol (ROBAXIN) tablet 500 mg  500 mg Oral Q6H PRN Jillyn Ledger, PA-C  500 mg at 01/23/22 2217   multivitamin with minerals tablet 1 tablet  1 tablet Oral Daily Lavina Hamman, MD   1 tablet at 01/25/22 1021   oxyCODONE (Oxy IR/ROXICODONE) immediate release tablet 2.5-5 mg  2.5-5 mg Oral Q6H PRN Jillyn Ledger, PA-C       thiamine tablet 100 mg  100 mg Oral Daily Lavina Hamman, MD   100 mg at 01/25/22 1173     Discharge Medications: Please see discharge summary for a list of discharge medications.  Relevant Imaging Results:  Relevant Lab Results:   Additional Information SSN: [id number].  Shelsy Seng Renold Don, LCSWA

## 2022-01-25 NOTE — TOC Progression Note (Signed)
Transition of Care [facility]) - Progression Note    Patient Details  Name: Carl Barr MRN: [id number] Date of Birth: 30-Dec-1955  Transition of Care [facility]) CM/SW Contact  Reece Agar, [place] Phone Number: 01/25/2022, 11:51 AM  Clinical Narrative:    CSW spoke with Loma Boston at [facility] she is able to accept pt for SNF and LTC with medicaid pending.Pt is medically stable for DC.    Expected Discharge Plan: [place] Barriers to Discharge: Continued Medical Work up, SNF Pending bed offer  Expected Discharge Plan and Services Expected Discharge Plan: [place] In-house Referral: Clinical Social Work Discharge Planning Services: CM Consult Post Acute Care Choice: [place] arrangements for the past 2 months: [place]                   DME Agency:  (Await PT eval)       HH Arranged: Therapist, sports (Await PT eval) [place] Agency: [place]         Social Determinants of Health (SDOH) Interventions    Readmission Risk Interventions     No data to display

## 2022-01-25 NOTE — TOC Transition Note (Signed)
Transition of Care [facility]) - CM/SW Discharge Note   Patient Details  Name: Carl Barr MRN: [id number] Date of Birth: 1956/06/30  Transition of Care [facility]) CM/SW Contact:  Tresa Endo Phone Number: 01/25/2022, 11:58 AM   Clinical Narrative:    [name] Anticipated DC date: 01/25/2022 Family notified: Pt Niece Transport by: Corey Harold   Per MD patient ready for DC to [facility]. RN to call report prior to discharge [phone]). RN, patient, patient's family, and facility notified of DC. Discharge Summary and FL2 sent to facility. DC packet on chart. Ambulance transport requested for patient.   CSW will sign off for now as social work intervention is no longer needed. Please consult [place] again if new needs arise.     Final next level of care: [place] Barriers to Discharge: Continued Medical Work up, SNF Pending bed offer   Patient Goals and CMS Choice Patient states their goals for this hospitalization and ongoing recovery are:: Rehab CMS Medicare.gov Compare Post Acute Care list provided to:: Patient Choice offered to / list presented to : Patient  Discharge Placement                       Discharge Plan and Services In-house Referral: Clinical Social Work Discharge Planning Services: CM Consult Post Acute Care Choice: [place]            DME Agency:  (Await PT eval)       HH Arranged: RN (Await PT eval) [place] Agency: [place]        Social Determinants of Health (SDOH) Interventions     Readmission Risk Interventions     No data to display

## 2022-01-25 NOTE — TOC Progression Note (Signed)
Transition of Care [facility]) - Progression Note    Patient Details  Name: Job Holtsclaw MRN: [id number] Date of Birth: 1956-06-21  [facility]) CM/SW Contact  Reece Agar, [place] Phone Number: 01/25/2022, 2:58 PM  Clinical Narrative:    After pt was accepted by [name], CSW attempted to DC pt. CSW was then informed that pt medicare could not be found. Pt will not DC to [employer] today, a payor source will have to be found first. CSW signing back on to this pt and will follow for DC plan.If pt's medicare is expired or cancelled he will have to learn his ostomy and DC home or stay until medicaid has a pending number.    Expected Discharge Plan: [place] Barriers to Discharge: Continued Medical Work up, SNF Pending bed offer  Expected Discharge Plan and Services Expected Discharge Plan: [place] In-house Referral: Clinical Social Work Discharge Planning Services: CM Consult Post Acute Care Choice: [place] arrangements for the past 2 months: Single Family Home Expected Discharge Date: 01/25/22                 DME Agency:  (Await PT eval)       HH Arranged: RN (Await PT eval) [place] Agency: [place]         Social Determinants of Health (SDOH) Interventions    Readmission Risk Interventions     No data to display

## 2022-01-26 DIAGNOSIS — C189 Malignant neoplasm of colon, unspecified: Secondary | ICD-10-CM | POA: Diagnosis not present

## 2022-01-26 DIAGNOSIS — K56609 Unspecified intestinal obstruction, unspecified as to partial versus complete obstruction: Secondary | ICD-10-CM | POA: Diagnosis not present

## 2022-01-26 DIAGNOSIS — E440 Moderate protein-calorie malnutrition: Secondary | ICD-10-CM | POA: Diagnosis not present

## 2022-01-26 NOTE — Progress Notes (Signed)
PROGRESS NOTE    Carl Barr  JJH:[id number] DOB: [birth year] DOA: 01/03/2022 PCP: Antony Blackbird, MD   Brief Narrative: Carl Barr is a 66 y.o. male without a known medical history. He presented secondary to being unable to have bowel movements or pass gas. Imaging identified two colonic strictures with resultant bowel obstruction with concern for malignancy. General surgery was consulted and performed colectomy/colostomy. Surgical pathology identified evidence of colon adenocarcinoma. Medical oncology consulted with plans for outpatient chemotherapy.  Assessment and Plan:  Large bowel obstruction Secondary to stricture/mass. Patient is s/p left sigmoid and distal transverse hemicolectomy with end colostomy by general surgery. Stable.  Colon adenocarcinoma Diagnosed this admission. Medical oncology consulted with plans to initiate adjuvant chemotherapy.  Postoperative ileus Resolved.  High output colostomy Appears to be improved.  Moderate malnutrition Dietitian recommendations (7/19): Continue current diet as ordered, encouraged PO intake Ensure Enlive po BID, each supplement provides 350 kcal and 20 grams of protein. Continue PO vitamin regimen MVI, thiamine, and folic acid for hx of EtOH abuse  Chronic alcohol use Counseled this admission.  Obesity Body mass index is 31.77 kg/m.  DVT prophylaxis: Lovenox Code Status:   Code Status: Full Code Family Communication: None at bedside Disposition Plan: SNF bed is available. Medically stable for discharge. Initially discharged on 7/21.   Consultants:  General surgery Medical oncology  Procedures:  LEFT, SIGMOID, AND DISTAL TRANSVERSE COLECTOMY; MOBILIZATION OF SPLENIC FLEXURE; COLOSTOMY (6/30) PICC line (7/7>>7/18)  Antimicrobials: None    Subjective: No concerns today. Thankful for cream provided for dry skin.  Objective: BP 114/73 (BP Location: Left Arm)   Pulse 68   Temp 98.3 F (36.8 C) (Oral)    Resp 18   Ht '5\' 9"'$  (1.753 m)   Wt 97.6 kg   SpO2 (!) 44%   BMI 31.77 kg/m   Examination:  General: Well appearing, no distress   Data Reviewed: I have personally reviewed following labs and imaging studies  CBC Lab Results  Component Value Date   WBC 11.2 (H) 01/22/2022   RBC 3.57 (L) 01/22/2022   HGB 8.9 (L) 01/22/2022   HCT 27.8 (L) 01/22/2022   MCV 77.9 (L) 01/22/2022   MCH 24.9 (L) 01/22/2022   PLT 440 (H) 01/22/2022   MCHC 32.0 01/22/2022   RDW 18.2 (H) 01/22/2022   LYMPHSABS 1.7 01/14/2022   MONOABS 5.5 (H) 01/14/2022   EOSABS 0.3 01/14/2022   BASOSABS 0.0 81/85/6314     Last metabolic panel Lab Results  Component Value Date   NA 134 (L) 01/22/2022   K 3.3 (L) 01/22/2022   CL 108 01/22/2022   CO2 23 01/22/2022   BUN 5 (L) 01/22/2022   CREATININE 0.45 (L) 01/22/2022   GLUCOSE 106 (H) 01/22/2022   GFRNONAA >60 01/22/2022   GFRAA >60 08/01/2019   CALCIUM 8.6 (L) 01/22/2022   PHOS 3.7 01/20/2022   PROT 7.3 01/20/2022   ALBUMIN 2.0 (L) 01/20/2022   LABGLOB 3.9 12/16/2018   AGRATIO 1.1 (L) 12/16/2018   BILITOT 0.4 01/20/2022   ALKPHOS 58 01/20/2022   AST 22 01/20/2022   ALT 41 01/20/2022   ANIONGAP 3 (L) 01/22/2022    GFR: Estimated Creatinine Clearance: 104.7 mL/min (A) (by C-G formula based on SCr of 0.45 mg/dL (L)).  No results found for this or any previous visit (from the past 240 hour(s)).    Radiology Studies: No results found.    LOS: 23 days    Cordelia Poche, MD [facility] 01/26/2022, 9:59 AM  If 7PM-7AM, please contact night-coverage [email or web address]

## 2022-01-26 NOTE — Progress Notes (Signed)
Pouch change for leakage. Staff need to empty when 1/4 full or the weight of the liquid stool will pull the pouch and create leakage. Informed pt of this so he can keep an eye on it for [place].

## 2022-01-27 DIAGNOSIS — E440 Moderate protein-calorie malnutrition: Secondary | ICD-10-CM | POA: Diagnosis not present

## 2022-01-27 DIAGNOSIS — K56609 Unspecified intestinal obstruction, unspecified as to partial versus complete obstruction: Secondary | ICD-10-CM | POA: Diagnosis not present

## 2022-01-27 DIAGNOSIS — C189 Malignant neoplasm of colon, unspecified: Secondary | ICD-10-CM | POA: Diagnosis not present

## 2022-01-27 LAB — GLUCOSE, CAPILLARY: Glucose-Capillary: 115 mg/dL — ABNORMAL HIGH (ref 70–99)

## 2022-01-27 NOTE — Progress Notes (Signed)
PROGRESS NOTE    Carl Barr  BJY:[id number] DOB: [birth year] DOA: 01/03/2022 PCP: Antony Blackbird, MD   Brief Narrative: Carl Barr is a 66 y.o. male without a known medical history. He presented secondary to being unable to have bowel movements or pass gas. Imaging identified two colonic strictures with resultant bowel obstruction with concern for malignancy. General surgery was consulted and performed colectomy/colostomy. Surgical pathology identified evidence of colon adenocarcinoma. Medical oncology consulted with plans for outpatient chemotherapy.  Assessment and Plan:  Large bowel obstruction Secondary to stricture/mass. Patient is s/p left sigmoid and distal transverse hemicolectomy with end colostomy by general surgery. Stable.  Colon adenocarcinoma Diagnosed this admission. Medical oncology consulted with plans to initiate adjuvant chemotherapy. Initial plan for IV therapy, but patient is declining port placement. Oncology to consider oral treatment.  Postoperative ileus Resolved.  High output colostomy Resolved.  Moderate malnutrition Dietitian recommendations (7/19): Continue current diet as ordered, encouraged PO intake Ensure Enlive po BID, each supplement provides 350 kcal and 20 grams of protein. Continue PO vitamin regimen MVI, thiamine, and folic acid for hx of EtOH abuse  Chronic alcohol use Counseled this admission.  Obesity Body mass index is 31.74 kg/m.  DVT prophylaxis: Lovenox Code Status:   Code Status: Full Code Family Communication: None at bedside Disposition Plan: SNF bed is available. Medically stable for discharge. Initially discharged on 7/21.   Consultants:  General surgery Medical oncology  Procedures:  LEFT, SIGMOID, AND DISTAL TRANSVERSE COLECTOMY; MOBILIZATION OF SPLENIC FLEXURE; COLOSTOMY (6/30) PICC line (7/7>>7/18)  Antimicrobials: None    Subjective: No issues today. No concerns overnight.  Objective: BP 117/75  (BP Location: Left Arm)   Pulse 98   Temp 97.7 F (36.5 C) (Oral)   Resp 16   Ht '5\' 9"'$  (1.753 m)   Wt 97.5 kg   SpO2 100%   BMI 31.74 kg/m   Examination:  General: Well appearing, no distress   Data Reviewed: I have personally reviewed following labs and imaging studies  CBC Lab Results  Component Value Date   WBC 11.2 (H) 01/22/2022   RBC 3.57 (L) 01/22/2022   HGB 8.9 (L) 01/22/2022   HCT 27.8 (L) 01/22/2022   MCV 77.9 (L) 01/22/2022   MCH 24.9 (L) 01/22/2022   PLT 440 (H) 01/22/2022   MCHC 32.0 01/22/2022   RDW 18.2 (H) 01/22/2022   LYMPHSABS 1.7 01/14/2022   MONOABS 5.5 (H) 01/14/2022   EOSABS 0.3 01/14/2022   BASOSABS 0.0 08/65/7846     Last metabolic panel Lab Results  Component Value Date   NA 134 (L) 01/22/2022   K 3.3 (L) 01/22/2022   CL 108 01/22/2022   CO2 23 01/22/2022   BUN 5 (L) 01/22/2022   CREATININE 0.45 (L) 01/22/2022   GLUCOSE 106 (H) 01/22/2022   GFRNONAA >60 01/22/2022   GFRAA >60 08/01/2019   CALCIUM 8.6 (L) 01/22/2022   PHOS 3.7 01/20/2022   PROT 7.3 01/20/2022   ALBUMIN 2.0 (L) 01/20/2022   LABGLOB 3.9 12/16/2018   AGRATIO 1.1 (L) 12/16/2018   BILITOT 0.4 01/20/2022   ALKPHOS 58 01/20/2022   AST 22 01/20/2022   ALT 41 01/20/2022   ANIONGAP 3 (L) 01/22/2022    GFR: Estimated Creatinine Clearance: 104.6 mL/min (A) (by C-G formula based on SCr of 0.45 mg/dL (L)).  No results found for this or any previous visit (from the past 240 hour(s)).    Radiology Studies: No results found.    LOS: 24 days  Cordelia Poche, MD [facility] 01/27/2022, 9:28 AM   If 7PM-7AM, please contact night-coverage [email or web address]

## 2022-01-27 NOTE — Plan of Care (Signed)
  Problem: Nutrition: Goal: Adequate nutrition will be maintained Outcome: Progressing   Problem: Pain Managment: Goal: General experience of comfort will improve Outcome: Progressing   Problem: Safety: Goal: Ability to remain free from injury will improve Outcome: Progressing   

## 2022-01-28 ENCOUNTER — Inpatient Hospital Stay: Payer: Medicare Other | Admitting: Oncology

## 2022-01-28 NOTE — Progress Notes (Signed)
Patient ID: Carl Barr, male   DOB: 1955/08/09, 66 y.o.   MRN: [id number] [facility] Surgery Progress Note  24 Days Post-Op  Subjective: CC-  Up in chair. Walked in the hall earlier with PT. Denies abdominal pain, nausea, vomiting. Tolerating diet. Colostomy functioning (900cc last 24 hours)  Objective: Vital signs in last 24 hours: Temp:  [98.3 F (36.8 C)-98.9 F (37.2 C)] 98.9 F (37.2 C) (07/24 0755) Pulse Rate:  [97-107] 107 (07/24 0755) Resp:  [16-18] 16 (07/24 0755) BP: (122-136)/(63-88) 123/78 (07/24 0755) SpO2:  [100 %] 100 % (07/24 0755) Weight:  [97.3 kg] 97.3 kg (07/24 0500) Last BM Date : 01/27/22  Intake/Output from previous day: 07/23 0701 - 07/24 0700 In: 59 [P.O.:720] Out: 2800 [Urine:1900; Stool:900] Intake/Output this shift: Total I/O In: -  Out: 800 [Urine:800]  PE: Gen:  Alert, NAD  Abd: Soft, ND, NT, ostomy bag with semisolid stool in bag. Unable to see stoma 2/2 stool. Midline wound closed and cdi at proximal aspect, inferior aspect of wound with two openings, some fibrinous tissue at the base and a little purulent drainage but otherwise with healthy granulation tissue on the sides, no cellulitis   Lab Results:  No results for input(s): "WBC", "HGB", "HCT", "PLT" in the last 72 hours. BMET No results for input(s): "NA", "K", "CL", "CO2", "GLUCOSE", "BUN", "CREATININE", "CALCIUM" in the last 72 hours. PT/INR No results for input(s): "LABPROT", "INR" in the last 72 hours. CMP     Component Value Date/Time   NA 134 (L) 01/22/2022 0433   NA 139 12/16/2018 1142   K 3.3 (L) 01/22/2022 0433   CL 108 01/22/2022 0433   CO2 23 01/22/2022 0433   GLUCOSE 106 (H) 01/22/2022 0433   BUN 5 (L) 01/22/2022 0433   BUN 15 12/16/2018 1142   CREATININE 0.45 (L) 01/22/2022 0433   CALCIUM 8.6 (L) 01/22/2022 0433   PROT 7.3 01/20/2022 0415   PROT 8.0 12/16/2018 1142   ALBUMIN 2.0 (L) 01/20/2022 0415   ALBUMIN 4.1 12/16/2018 1142   AST 22 01/20/2022  0415   ALT 41 01/20/2022 0415   ALKPHOS 58 01/20/2022 0415   BILITOT 0.4 01/20/2022 0415   BILITOT 0.4 12/16/2018 1142   GFRNONAA >60 01/22/2022 0433   GFRAA >60 08/01/2019 0654   Lipase     Component Value Date/Time   LIPASE 29 01/03/2022 1714       Studies/Results: No results found.  Anti-infectives: Anti-infectives (From admission, onward)    Start     Dose/Rate Route Frequency Ordered Stop   01/04/22 0830  cefoTEtan (CEFOTAN) 2 g in sodium chloride 0.9 % 100 mL IVPB        2 g 200 mL/hr over 30 Minutes Intravenous  Once 01/04/22 0805 01/04/22 1010        Assessment/Plan POD#24 s/p open extended left hemicolectomy with end colostomy for a colonic obstruction secondary to sigmoid mass and thickening of splenic flexure by Dr. Grandville Silos on 01/04/22 - Surgical pathology shows a well-differentiated colon adenocarcinoma, stage [id number]. Chest CT shows no evidence of metastatic disease. Will need chemotherapy once recovered, medical oncology (Dr. Benay Spice) has seen the patient. They are scheduling outpatient f/u.  - CT 7/5: with ileus but no abscess or leak noted, mild wall thickening of transverse colon - Previous large volume stool from ostomy - C. Diff neg 7/9. Output stable at ~800-1069m/day. Do not recommend immodium given prior ileus and ongoing distension. Continue to monitor. - TPN off. Continue soft diet. Shakes. -  BID WTD to inferior aspect of wound making sure to pack area of undermining. Staples out 7/17. Monitor.  - Mobilize, PT following. Awaiting SNF   FEN: Soft diet, esnure VTE: SCDs, LMWH ID: None currently. Afebrile. Leukocytosis likely secondary to asplenia (stable to improved - last checked 7/18).  Foley: External Dispo: SNF. Stable from surgical standpoint for discharge. Follow up on AVS.    LOS: 25 days    Wellington Hampshire, [facility] Surgery 01/28/2022, 11:16 AM Please see Amion for pager number during day hours 7:00am-4:30pm

## 2022-01-28 NOTE — Progress Notes (Signed)
Physical Therapy Treatment Patient Details Name: Carl Barr MRN: [id number] DOB: August 22, 1955 Today's Date: 01/28/2022   History of Present Illness 66 year old male without any past medical history presents to the ER with complaints of constipation with nausea and vomiting.  Found to have bowel obstruction with colonic stricture concerning for possible malignancy.  S/P Left sigmoid and distal transverse colectomy and colostomy creation on 6/30.  7/3, developed ileus with nausea and vomiting.  Continues to have output from the colostomy bag.  Unable to insert NG x2.    PT Comments    Pt admitted with above diagnosis. Pt met 1/5 goals. Goals revised with pt making slow progress toward goals due to weakness and pt self limiting as well. Pt frequency decreased as pt is going to SNF for therapy at d/c and appropriate freq is 2xweek.  Will continue to follow acutely.  Pt currently with functional limitations due to balance and endurance deficits. Pt will benefit from skilled PT to increase their independence and safety with mobility to allow discharge to the venue listed below.      Recommendations for follow up therapy are one component of a multi-disciplinary discharge planning process, led by the attending physician.  Recommendations may be updated based on patient status, additional functional criteria and insurance authorization.  Follow Up Recommendations  Skilled nursing-short term rehab (<3 hours/day) Can patient physically be transported by private vehicle: Yes   Assistance Recommended at Discharge Intermittent Supervision/Assistance  Patient can return home with the following A little help with bathing/dressing/bathroom;Assistance with cooking/housework;Direct supervision/assist for medications management;Assist for transportation;Help with stairs or ramp for entrance;A lot of help with walking and/or transfers   Equipment Recommendations  Rolling walker (2 wheels);BSC/3in1     Recommendations for Other Services       Precautions / Restrictions Precautions Precautions: Fall Precaution Comments: colostomy Restrictions Weight Bearing Restrictions: No     Mobility  Bed Mobility Overal bed mobility: Needs Assistance Bed Mobility: Supine to Sit Rolling: Min assist Sidelying to sit: Min assist Supine to sit: Min assist     General bed mobility comments: Pt required increased time to complete supine to sit due to impaired motor planning. Pt used bed rail on R.    Transfers Overall transfer level: Needs assistance Equipment used: Rolling walker (2 wheels) Transfers: Sit to/from Stand Sit to Stand: Min assist, Min guard Stand pivot transfers: Min assist Step pivot transfers: Min guard       General transfer comment: Cues for hand placement and safety awareness.    Ambulation/Gait Ambulation/Gait assistance: Min guard Gait Distance (Feet): 300 Feet (150 feet x 2) Assistive device: Rolling walker (2 wheels) Gait Pattern/deviations: Step-through pattern, Trunk flexed, Wide base of support, Decreased stride length Gait velocity: decreased Gait velocity interpretation: 1.31 - 2.62 ft/sec, indicative of limited community ambulator   General Gait Details: Pt ambulated 150 feet x 2 with seated rest break between. Pt more aware of his flexed positioning this session but continues to state "I cant correct it."   Stairs             Wheelchair Mobility    Modified Rankin (Stroke Patients Only)       Balance Overall balance assessment: Needs assistance Sitting-balance support: No upper extremity supported, Feet supported Sitting balance-Leahy Scale: Fair     Standing balance support: Bilateral upper extremity supported Standing balance-Leahy Scale: Poor Standing balance comment: relies on UE support  Cognition Arousal/Alertness: Awake/alert Behavior During Therapy: Flat affect Overall Cognitive  Status: Impaired/Different from baseline Area of Impairment: Following commands, Safety/judgement, Problem solving, Memory                     Memory: Decreased short-term memory, Decreased recall of precautions Following Commands: Follows one step commands with increased time Safety/Judgement: Decreased awareness of safety, Decreased awareness of deficits   Problem Solving: Difficulty sequencing, Requires verbal cues, Decreased initiation, Slow processing General Comments: Pt confused and has difficulty making his needs clear.        Exercises General Exercises - Lower Extremity Quad Sets: Both, 10 reps, Supine Gluteal Sets: Strengthening, Both, 10 reps, Seated Long Arc Quad: Strengthening, Both, 20 reps, Seated Hip Flexion/Marching: AROM, Both, 10 reps, Seated    General Comments        Pertinent Vitals/Pain Pain Assessment Pain Assessment: No/denies pain    Home Living                          Prior Function            PT Goals (current goals can now be found in the care plan section) Acute Rehab PT Goals Patient Stated Goal: to go home PT Goal Formulation: With patient Time For Goal Achievement: 02/11/22 Potential to Achieve Goals: Good Progress towards PT goals: Progressing toward goals    Frequency    Min 2X/week      PT Plan Frequency needs to be updated    Co-evaluation              AM-PAC PT "6 Clicks" Mobility   Outcome Measure  Help needed turning from your back to your side while in a flat bed without using bedrails?: A Little Help needed moving from lying on your back to sitting on the side of a flat bed without using bedrails?: A Little Help needed moving to and from a bed to a chair (including a wheelchair)?: A Little Help needed standing up from a chair using your arms (e.g., wheelchair or bedside chair)?: A Little Help needed to walk in hospital room?: A Little Help needed climbing 3-5 steps with a railing? : A  Lot 6 Click Score: 17    End of Session Equipment Utilized During Treatment: Gait belt Activity Tolerance: Patient limited by fatigue;Patient tolerated treatment well Patient left: with call bell/phone within reach;in chair;with chair alarm set Nurse Communication: Mobility status PT Visit Diagnosis: Unsteadiness on feet (R26.81);Muscle weakness (generalized) (M62.81)     Time: 6203-5597 PT Time Calculation (min) (ACUTE ONLY): 27 min  Charges:  $Gait Training: 23-37 mins                     Shila Kruczek M,PT Acute Rehab Services [place] 01/28/2022, 1:40 PM

## 2022-01-28 NOTE — Progress Notes (Signed)
Mobility Specialist Progress Note   01/28/22 1301  Mobility  Activity Ambulated with assistance in hallway  Level of Assistance Contact guard assist, steadying assist  Assistive Device Front wheel walker  Distance Ambulated (ft) 142 ft  Activity Response Tolerated well  $Mobility charge 1 Mobility   Pre Mobility: 107 HR, 106/71 BP, 100% SpO2 Post Mobility: 107 HR, 107/68 BP, 100% SpO2  Pt found in chair w/ no complaints and was agreeable for mobility. X1 seated break d/t fatigue and ambulated without faults or incidents. Pt was left in chair w/ chair alarm and call bell in reach.   Holland Falling Mobility Specialist MS [facility] #:  [phone] Acute Rehab Office:  [phone]

## 2022-01-28 NOTE — Progress Notes (Signed)
Patient seen and examined at bedside. No new changes to discharge summary. Medically stable for discharge.  Cordelia Poche, MD [facility] 01/28/2022, 11:03 AM

## 2022-01-28 NOTE — TOC Transition Note (Signed)
Transition of Care [facility]) - CM/SW Discharge Note   Patient Details  Name: [name] MRN: [id number] Date of Birth: Feb 06, 1956  [facility]) CM/SW Contact:  Tresa Endo Phone Number: 01/28/2022, 11:14 AM   Clinical Narrative:    Patient will DC to: [name]  Anticipated DC date: 01/28/2022 Family notified: Pt Niece Transport by: Corey Harold   Per MD patient ready for DC to [facility]. RN to call report prior to discharge [phone]). RN, patient, patient's family, and facility notified of DC. Discharge Summary and FL2 sent to facility. DC packet on chart. Ambulance transport requested for patient.   CSW will sign off for now as social work intervention is no longer needed. Please consult [place] again if new needs arise.     Final next level of care: [place] Barriers to Discharge: Continued Medical Work up, SNF Pending bed offer   Patient Goals and CMS Choice Patient states their goals for this hospitalization and ongoing recovery are:: Rehab CMS Medicare.gov Compare Post Acute Care list provided to:: Patient Choice offered to / list presented to : Patient  Discharge Placement                       Discharge Plan and Services In-house Referral: Clinical Social Work Discharge Planning Services: CM Consult Post Acute Care Choice: [place]            DME Agency:  (Await PT eval)       HH Arranged: RN (Await PT eval) [place] Agency: [place]        Social Determinants of Health (SDOH) Interventions     Readmission Risk Interventions     No data to display

## 2022-01-29 ENCOUNTER — Other Ambulatory Visit: Payer: Self-pay

## 2022-01-29 ENCOUNTER — Encounter: Payer: Self-pay | Admitting: *Deleted

## 2022-01-29 ENCOUNTER — Emergency Department (HOSPITAL_COMMUNITY)
Admission: EM | Admit: 2022-01-29 | Discharge: 2022-01-29 | Disposition: A | Discharge status: Skilled Nursing Facility | Payer: Medicare Other | Attending: Emergency Medicine | Admitting: Emergency Medicine

## 2022-01-29 ENCOUNTER — Encounter (HOSPITAL_COMMUNITY): Payer: Self-pay

## 2022-01-29 DIAGNOSIS — Z4801 Encounter for change or removal of surgical wound dressing: Secondary | ICD-10-CM | POA: Insufficient documentation

## 2022-01-29 DIAGNOSIS — Z85038 Personal history of other malignant neoplasm of large intestine: Secondary | ICD-10-CM | POA: Insufficient documentation

## 2022-01-29 DIAGNOSIS — Z4889 Encounter for other specified surgical aftercare: Secondary | ICD-10-CM

## 2022-01-29 DIAGNOSIS — R109 Unspecified abdominal pain: Secondary | ICD-10-CM | POA: Insufficient documentation

## 2022-01-29 LAB — COMPREHENSIVE METABOLIC PANEL
ALT: 22 U/L (ref 0–44)
AST: 18 U/L (ref 15–41)
Albumin: 2.2 g/dL — ABNORMAL LOW (ref 3.5–5.0)
Alkaline Phosphatase: 51 U/L (ref 38–126)
Anion gap: 11 (ref 5–15)
BUN: 6 mg/dL — ABNORMAL LOW (ref 8–23)
CO2: 22 mmol/L (ref 22–32)
Calcium: 8.6 mg/dL — ABNORMAL LOW (ref 8.9–10.3)
Chloride: 103 mmol/L (ref 98–111)
Creatinine, Ser: 0.47 mg/dL — ABNORMAL LOW (ref 0.61–1.24)
GFR, Estimated: >60 mL/min (ref >60)
Glucose, Bld: 96 mg/dL (ref 70–99)
Potassium: 3.9 mmol/L (ref 3.5–5.1)
Sodium: 136 mmol/L (ref 135–145)
Total Bilirubin: <0.1 mg/dL — ABNORMAL LOW (ref 0.3–1.2)
Total Protein: 7.2 g/dL (ref 6.5–8.1)

## 2022-01-29 LAB — CBC WITH DIFFERENTIAL/PLATELET
Abs Immature Granulocytes: 0.02 10*3/uL (ref 0.00–0.07)
Basophils Absolute: 0 10*3/uL (ref 0.0–0.1)
Basophils Relative: 1 %
Eosinophils Absolute: 0.4 10*3/uL (ref 0.0–0.5)
Eosinophils Relative: 6 %
HCT: 30.6 % — ABNORMAL LOW (ref 39.0–52.0)
Hemoglobin: 9.4 g/dL — ABNORMAL LOW (ref 13.0–17.0)
Immature Granulocytes: 0 %
Lymphocytes Relative: 20 %
Lymphs Abs: 1.2 10*3/uL (ref 0.7–4.0)
MCH: 24.2 pg — ABNORMAL LOW (ref 26.0–34.0)
MCHC: 30.7 g/dL (ref 30.0–36.0)
MCV: 78.9 fL — ABNORMAL LOW (ref 80.0–100.0)
Monocytes Absolute: 0.9 10*3/uL (ref 0.1–1.0)
Monocytes Relative: 14 %
Neutro Abs: 3.6 10*3/uL (ref 1.7–7.7)
Neutrophils Relative %: 59 %
Platelets: 331 10*3/uL (ref 150–400)
RBC: 3.88 MIL/uL — ABNORMAL LOW (ref 4.22–5.81)
RDW: 18.3 % — ABNORMAL HIGH (ref 11.5–15.5)
WBC: 6.1 10*3/uL (ref 4.0–10.5)
nRBC: 0 % (ref 0.0–0.2)

## 2022-01-29 NOTE — ED Provider Notes (Signed)
[facility] EMERGENCY DEPARTMENT Provider Note   CSN: [id number] Arrival date & time: 01/29/22  1446     History  Chief Complaint  Patient presents with   Wound Infection    Carl Barr is a 66 y.o. male.  Patient sent from his facility with concern for surgical wound infection.  Patient underwent a colectomy and colostomy for colon cancer and large bowel obstruction by Dr. Grandville Silos on June 30.  He apparently was discharged from rehab today went to his facility.  They were concerned about greenish-white drainage from the patient's open wound to his abdomen.  Patient states he feels fine and denies any significant pain.  No fever, chills, nausea or vomiting.  No chest pain or shortness of breath.  He has not noticed any change with the wounds and does not know how they are being cared for.  The history is provided by the patient.       Home Medications Prior to Admission medications   Medication Sig Start Date End Date Taking? Authorizing Provider  feeding supplement (ENSURE ENLIVE / ENSURE PLUS) LIQD Take 237 mLs by mouth 2 (two) times daily between meals. 01/26/22   Mariel Aloe, MD  folic acid (FOLVITE) 1 MG tablet Take 1 tablet (1 mg total) by mouth daily. 01/26/22   Mariel Aloe, MD  hydrocerin (EUCERIN) CREA Apply 1 Application topically 2 (two) times daily. 01/25/22   Mariel Aloe, MD  Multiple Vitamin (MULTIVITAMIN WITH MINERALS) TABS tablet Take 1 tablet by mouth daily. 01/26/22   Mariel Aloe, MD  thiamine 100 MG tablet Take 1 tablet (100 mg total) by mouth daily. 01/26/22   Mariel Aloe, MD      Allergies    Patient has no known allergies.    Review of Systems   Review of Systems  Constitutional:  Negative for activity change, appetite change and fever.  Gastrointestinal:  Positive for abdominal pain. Negative for nausea and vomiting.   all other systems are negative except as noted in the HPI and PMH.    Physical Exam Updated  Vital Signs BP 107/69   Pulse 81   Temp 98.5 F (36.9 C) (Oral)   Resp 18   SpO2 100%  Physical Exam Vitals and nursing note reviewed.  Constitutional:      General: He is not in acute distress.    Appearance: He is well-developed.  HENT:     Head: Normocephalic and atraumatic.     Mouth/Throat:     Pharynx: No oropharyngeal exudate.  Eyes:     Conjunctiva/sclera: Conjunctivae normal.     Pupils: Pupils are equal, round, and reactive to light.  Neck:     Comments: No meningismus. Cardiovascular:     Rate and Rhythm: Normal rate and regular rhythm.     Heart sounds: Normal heart sounds. No murmur heard. Pulmonary:     Effort: Pulmonary effort is normal. No respiratory distress.     Breath sounds: Normal breath sounds.  Abdominal:     Palpations: Abdomen is soft.     Tenderness: There is abdominal tenderness. There is no guarding or rebound.     Comments: Left side colostomy with brown stool.  Midline abdominal incision with 2 open areas that are packed with 4 x 4's. There is healthy granulation tissue but white and green drainage from the more superior of the wounds.  Musculoskeletal:        General: No tenderness. Normal range of motion.  Cervical back: Normal range of motion and neck supple.  Skin:    General: Skin is warm.  Neurological:     Mental Status: He is alert and oriented to person, place, and time.     Cranial Nerves: No cranial nerve deficit.     Motor: No abnormal muscle tone.     Coordination: Coordination normal.     Comments:  5/5 strength throughout. CN 2-12 intact.Equal grip strength.   Psychiatric:        Behavior: Behavior normal.          ED Results / Procedures / Treatments   Labs (all labs ordered are listed, but only abnormal results are displayed) Labs Reviewed  COMPREHENSIVE METABOLIC PANEL - Abnormal; Notable for the following components:      Result Value   BUN 6 (*)    Creatinine, Ser 0.47 (*)    Calcium 8.6 (*)     Albumin 2.2 (*)    Total Bilirubin <0.1 (*)    All other components within normal limits  CBC WITH DIFFERENTIAL/PLATELET - Abnormal; Notable for the following components:   RBC 3.88 (*)    Hemoglobin 9.4 (*)    HCT 30.6 (*)    MCV 78.9 (*)    MCH 24.2 (*)    RDW 18.3 (*)    All other components within normal limits    EKG None  Radiology No results found.  Procedures Procedures    Medications Ordered in ED Medications - No data to display  ED Course/ Medical Decision Making/ A&P                           Medical Decision Making Amount and/or Complexity of Data Reviewed Labs: ordered. Decision-making details documented in ED Course. Radiology: ordered and independent interpretation performed. Decision-making details documented in ED Course. ECG/medicine tests: ordered and independent interpretation performed. Decision-making details documented in ED Course.   Patient is status post colostomy and colectomy with open abdominal wound sent from his facility with concern for infection.  He has stable vital signs and no fever.  Patient appears well and nontoxic.  He just left hospital yesterday. Labs are reassuring with no leukocytosis.  Imaging as above shows small amount of purulence versus fibrinous exudate from superior wound in the midline.  Discussed with Dr. Barry Dienes of general surgery who reviewed the images and patient's presentation.  She does not feel this represents infection.  This is likely fibrinous material that is common from open wounds.  No evidence of surrounding cellulitis.  Patient with no fever or leukocytosis. Dr. Barry Dienes recommends wound packing, follow-up as scheduled, no need for CT scan today or antibiotics.  Plan to return him to his facility for further wound care with dressing changes once a day and surgery follow-up.  Return precautions discussed.        Final Clinical Impression(s) / ED Diagnoses Final diagnoses:  Encounter for post  surgical wound check    Rx / DC Orders ED Discharge Orders     None         Glenna Brunkow, Annie Main, MD 01/29/22 2201

## 2022-01-29 NOTE — ED Triage Notes (Signed)
Pt BIB [facility] from [facility] today for a wound check. Pt was here and had a colostomy and was sent to [place] yesterday. Pt also has wounds below the navel. Pre staff today they changed that dressing and it was oozing green drainage with the skin coming off.    112/70 90 18 94 RA

## 2022-01-29 NOTE — ED Provider Triage Note (Signed)
Emergency Medicine Provider Triage Evaluation Note  Carl Barr , a 66 y.o. male  was evaluated in triage.  Pt complains of discharge yesterday, sent to SNF.  He has wounds on his abdomen from recent surgery and had been seen by wound care at the SNF today and they were concerned that there was green drainage and sent him back here.  Patient states that he feels about the same, denies any concerns.   Physical Exam  BP 108/74 (BP Location: Left Arm)   Pulse 81   Temp 98.6 F (37 C) (Oral)   Resp 20   SpO2 97%  Gen:   Awake, no distress   Resp:  Normal effort  MSK:   Moves extremities without difficulty  Other:  Ostomy present, there are at least two tunneling and packed wounds on the anterior abdomen.  Medical Decision Making  Medically screening exam initiated at 3:58 PM.  Appropriate orders placed.  Trigo Winterbottom was informed that the remainder of the evaluation will be completed by another provider, this initial triage assessment does not replace that evaluation, and the importance of remaining in the ED until their evaluation is complete.     Lorin Glass, [place] 01/29/22 1559

## 2022-01-29 NOTE — ED Notes (Signed)
This Rn has spoken with PTAR and the patient's facility. All are onboard with the patients ability to come back to his facility.

## 2022-01-29 NOTE — ED Notes (Signed)
PTAR at bedside at this time and have appropriate paperwork in hand

## 2022-01-29 NOTE — Discharge Instructions (Signed)
Surgeon today does not feel that the wound is infected.  Continue the packing once daily and follow-up with your postoperative appointments as scheduled.  Return to the ED for worsening pain, fever, vomiting, increased drainage or other concerns

## 2022-01-29 NOTE — Progress Notes (Signed)
Per Dr. Benay Spice: Needs 30 minute hospital follow up with him on 02/06/22 at 10:00 Currently in SNF, [facility]. Scheduling message sent.

## 2022-01-29 NOTE — ED Notes (Signed)
Wound care provided to two open deep abdominal wounds. Provider at bedside to see wound during wound care. This RN also provided ostomy bag change and ostomy care. Patient verbalizes that he will not be able to do this at home despite everyone's best effort to educate him

## 2022-02-04 ENCOUNTER — Inpatient Hospital Stay: Payer: MEDICAID | Admitting: Nurse Practitioner

## 2022-02-06 ENCOUNTER — Encounter: Payer: Self-pay | Admitting: *Deleted

## 2022-02-06 ENCOUNTER — Inpatient Hospital Stay: Payer: No Typology Code available for payment source | Attending: Oncology | Admitting: Oncology

## 2022-02-06 VITALS — BP 113/66 | HR 99 | Temp 98.1°F | Resp 18 | Ht 69.0 in | Wt 209.2 lb

## 2022-02-06 DIAGNOSIS — E46 Unspecified protein-calorie malnutrition: Secondary | ICD-10-CM | POA: Diagnosis not present

## 2022-02-06 DIAGNOSIS — C189 Malignant neoplasm of colon, unspecified: Secondary | ICD-10-CM | POA: Diagnosis present

## 2022-02-06 DIAGNOSIS — D72829 Elevated white blood cell count, unspecified: Secondary | ICD-10-CM | POA: Insufficient documentation

## 2022-02-06 DIAGNOSIS — D509 Iron deficiency anemia, unspecified: Secondary | ICD-10-CM | POA: Diagnosis not present

## 2022-02-06 DIAGNOSIS — D75839 Thrombocytosis, unspecified: Secondary | ICD-10-CM | POA: Insufficient documentation

## 2022-02-06 NOTE — Progress Notes (Signed)
PATIENT NAVIGATOR PROGRESS NOTE  Name: Carl Barr Date: 02/06/2022 MRN: [id number]  DOB: 26-Feb-1956   Reason for visit:  Initial clinic visit with Dr Benay Spice  Comments:  Met with [name] during visit with Dr Benay Spice  Dr Benay Spice evaluated abdominal wound and upon examination pt still has 3 open areas requiring wet to dry dressings. Dressing changed in clinic today after exam by Dr Benay Spice Patient will return to clinic in 2 weeks for further evaluation of wound closure/healing Pre treatment discussion for Capecitabine discussed with Dr Benay Spice, pt provided written information on Capecitabine  Given contact information to call with any issues or questions    Time spent counseling/coordinating care: > 60 minutes

## 2022-02-06 NOTE — Progress Notes (Signed)
[place] OFFICE PROGRESS NOTE   Diagnosis: Colon cancer  INTERVAL HISTORY:   Carl Barr was discharged to a skilled nursing facility on 01/28/2022.  He reports the abdominal wound is healing.  He is eating.  The colostomy is functioning. He was seen in the emergency room on 01/29/2022 after the nursing facility caretakers noted drainage from the wound.  The wound did not appear infected.  He continues wound packing.  He is here today with a staff member from the nursing center.  No family is present.  Objective:  Vital signs in last 24 hours:  Blood pressure 113/66, pulse 99, temperature 98.1 F (36.7 C), temperature source Oral, resp. rate 18, height _0  (1.753 m), weight 209 lb 3.2 oz (94.9 kg), SpO2 99 %.    HEENT: No thrush or ulcers Resp: Decreased breath sounds with end inspiratory rales at the posterior base bilaterally, no respiratory distress Cardio: Regular rate and rhythm GI: No hepatosplenomegaly, left lower quadrant colostomy with soft stool, the midline wound has 3 open areas.  2 are superficial.  There is a deep tooth-3 cm opening at the mid aspect of the wound.  No surrounding erythema. Vascular: No leg edema   Lab Results:  Lab Results  Component Value Date   WBC 6.1 01/29/2022   HGB 9.4 (L) 01/29/2022   HCT 30.6 (L) 01/29/2022   MCV 78.9 (L) 01/29/2022   PLT 331 01/29/2022   NEUTROABS 3.6 01/29/2022    CMP  Lab Results  Component Value Date   NA 136 01/29/2022   K 3.9 01/29/2022   CL 103 01/29/2022   CO2 22 01/29/2022   GLUCOSE 96 01/29/2022   BUN 6 (L) 01/29/2022   CREATININE 0.47 (L) 01/29/2022   CALCIUM 8.6 (L) 01/29/2022   PROT 7.2 01/29/2022   ALBUMIN 2.2 (L) 01/29/2022   AST 18 01/29/2022   ALT 22 01/29/2022   ALKPHOS 51 01/29/2022   BILITOT <0.1 (L) 01/29/2022   GFRNONAA >60 01/29/2022   GFRAA >60 08/01/2019    Lab Results  Component Value Date   CEA1 5.5 (H) 01/04/2022    Lab Results  Component Value Date    INR 1.2 01/04/2022   LABPROT 15.0 01/04/2022    Imaging:  No results found.  Medications: I have reviewed the patient's current medications.   Assessment/Plan: Stage IIIb (pT4a, pN1b) well-differentiated colon adenocarcinoma -01/03/2022 CT abdomen/pelvis with contrast-2 areas of stricture in the colon, bowel obstruction related to these 2 colonic strictures -01/04/2022 CEA was 5.5 -Status post left hemicolectomy with end colostomy 01/04/2022-surgical path shows a well differentiated colon adenocarcinoma, pT4a pN1b, no loss of mismatch repair protein expression -01/15/2022 CT chest without contrast-no worrisome pulmonary nodules to suggest pulmonary metastatic disease, no mediastinal or hilar adenopathy. Microcytic anemia Thrombocytosis, likely reactive Leukocytosis Protein calorie malnutrition 6.  History of alcohol abuse 7.  High output colostomy     Disposition: Carl Barr has been diagnosed with stage III colon cancer.  He underwent a left colectomy and end colostomy 01/04/2022.  He continues to recover from surgery.  The midline abdominal wound is healing, but has several remaining open areas.  He will continue packing of the wound.  Carl Barr has a high chance of developing recurrent colon cancer over the next several years.  We discussed adjuvant treatment options.  I explained the standard recommendation for 5-fluorouracil and oxaliplatin based chemotherapy.  We discussed the need for Port-A-Cath placement.  Carl Barr indicated he does not wish to  receive intravenous chemotherapy.  This is consistent with his statement when I saw him in the hospital.  I recommend adjuvant capecitabine.  We reviewed potential toxicities associated with capecitabine including the chance of nausea, mucositis, diarrhea, and hematologic toxicity.  We discussed the rash, sun sensitivity, hyperpigmentation, and hand/foot syndrome associated with capecitabine.  He agrees to proceed.  He will return  for an office visit in 2 weeks.  I will prescribe adjuvant capecitabine if the wound has healed significantly when he is seen in 2 weeks.    Betsy Coder, MD  02/06/2022  10:51 AM

## 2022-02-20 ENCOUNTER — Encounter: Payer: Self-pay | Admitting: Nurse Practitioner

## 2022-02-20 ENCOUNTER — Inpatient Hospital Stay: Payer: No Typology Code available for payment source

## 2022-02-20 ENCOUNTER — Inpatient Hospital Stay (HOSPITAL_BASED_OUTPATIENT_CLINIC_OR_DEPARTMENT_OTHER): Payer: No Typology Code available for payment source | Admitting: Nurse Practitioner

## 2022-02-20 VITALS — BP 119/70 | HR 83 | Temp 98.1°F | Resp 18 | Ht 69.0 in | Wt 213.0 lb

## 2022-02-20 DIAGNOSIS — C189 Malignant neoplasm of colon, unspecified: Secondary | ICD-10-CM | POA: Diagnosis not present

## 2022-02-20 LAB — CMP (CANCER CENTER ONLY)
ALT: 13 U/L (ref 0–44)
AST: 13 U/L — ABNORMAL LOW (ref 15–41)
Albumin: 3.3 g/dL — ABNORMAL LOW (ref 3.5–5.0)
Alkaline Phosphatase: 58 U/L (ref 38–126)
Anion gap: 6 (ref 5–15)
BUN: 8 mg/dL (ref 8–23)
CO2: 27 mmol/L (ref 22–32)
Calcium: 9.5 mg/dL (ref 8.9–10.3)
Chloride: 100 mmol/L (ref 98–111)
Creatinine: 0.8 mg/dL (ref 0.61–1.24)
GFR, Estimated: >60 mL/min (ref >60)
Glucose, Bld: 94 mg/dL (ref 70–99)
Potassium: 3.7 mmol/L (ref 3.5–5.1)
Sodium: 133 mmol/L — ABNORMAL LOW (ref 135–145)
Total Bilirubin: 0.3 mg/dL (ref 0.3–1.2)
Total Protein: 7.8 g/dL (ref 6.5–8.1)

## 2022-02-20 LAB — CBC WITH DIFFERENTIAL (CANCER CENTER ONLY)
Abs Immature Granulocytes: 0.01 10*3/uL (ref 0.00–0.07)
Basophils Absolute: 0 10*3/uL (ref 0.0–0.1)
Basophils Relative: 1 %
Eosinophils Absolute: 0.2 10*3/uL (ref 0.0–0.5)
Eosinophils Relative: 4 %
HCT: 33.4 % — ABNORMAL LOW (ref 39.0–52.0)
Hemoglobin: 10.6 g/dL — ABNORMAL LOW (ref 13.0–17.0)
Immature Granulocytes: 0 %
Lymphocytes Relative: 26 %
Lymphs Abs: 1.6 10*3/uL (ref 0.7–4.0)
MCH: 23.9 pg — ABNORMAL LOW (ref 26.0–34.0)
MCHC: 31.7 g/dL (ref 30.0–36.0)
MCV: 75.2 fL — ABNORMAL LOW (ref 80.0–100.0)
Monocytes Absolute: 0.6 10*3/uL (ref 0.1–1.0)
Monocytes Relative: 11 %
Neutro Abs: 3.6 10*3/uL (ref 1.7–7.7)
Neutrophils Relative %: 58 %
Platelet Count: 402 10*3/uL — ABNORMAL HIGH (ref 150–400)
RBC: 4.44 MIL/uL (ref 4.22–5.81)
RDW: 17.4 % — ABNORMAL HIGH (ref 11.5–15.5)
WBC Count: 6.1 10*3/uL (ref 4.0–10.5)
nRBC: 0 % (ref 0.0–0.2)

## 2022-02-20 LAB — CEA (ACCESS): CEA (CHCC): 1 ng/mL (ref 0.00–5.00)

## 2022-02-20 NOTE — Progress Notes (Unsigned)
  [place] OFFICE PROGRESS NOTE   Diagnosis: Colon cancer  INTERVAL HISTORY:   Carl Barr returns as scheduled.  He thinks the abdominal wound is better.  He reports a good appetite.  No nausea or vomiting.  He denies pain.  Objective:  Vital signs in last 24 hours:  Blood pressure 119/70, pulse 83, temperature 98.1 F (36.7 C), temperature source Oral, resp. rate 18, height $RemoveBe'5\' 9"'AOHvOtMST$  (1.753 m), weight 213 lb (96.6 kg), SpO2 98 %.    HEENT: No thrush or ulcers. Resp: Lungs clear bilaterally. Cardio: Regular rate and rhythm. GI: No hepatomegaly.  Left lower quadrant colostomy with soft stool.  Abdominal wound appears to be healing. Vascular: Trace edema lower leg bilaterally.   Lab Results:  Lab Results  Component Value Date   WBC 6.1 02/20/2022   HGB 10.6 (L) 02/20/2022   HCT 33.4 (L) 02/20/2022   MCV 75.2 (L) 02/20/2022   PLT 402 (H) 02/20/2022   NEUTROABS 3.6 02/20/2022    Imaging:  No results found.  Medications: I have reviewed the patient's current medications.  Assessment/Plan: Stage IIIb (pT4a, pN1b) well-differentiated colon adenocarcinoma -01/03/2022 CT abdomen/pelvis with contrast-2 areas of stricture in the colon, bowel obstruction related to these 2 colonic strictures -01/04/2022 CEA was 5.5 -Status post left hemicolectomy with end colostomy 01/04/2022-surgical path shows a well differentiated colon adenocarcinoma, pT4a pN1b, no loss of mismatch repair protein expression -01/15/2022 CT chest without contrast-no worrisome pulmonary nodules to suggest pulmonary metastatic disease, no mediastinal or hilar adenopathy. Microcytic anemia Thrombocytosis, likely reactive Leukocytosis Protein calorie malnutrition 6.  History of alcohol abuse 7.  High output colostomy  Disposition: Carl Barr appears stable.  The abdominal wound appears to be healing.  We reviewed the recommendation for adjuvant Xeloda.  Potential toxicities discussed.  He agrees  to proceed.  He will return for a follow-up appointment in 1 week to reevaluate the abdominal wound.  If the wound has healed adequately at that time the plan is to begin adjuvant Xeloda.  Patient seen with Dr. Benay Spice.    Ned Card ANP/GNP-BC   02/20/2022  2:38 PM  This was a shared visit with Ned Card.  Carl Barr was interviewed and examined.  The abdominal wound is healing.  The plan is to begin adjuvant capecitabine if there is further healing next week.  We will contact the skilled nursing facility to be sure he can receive capecitabine while there.  I was present for greater than 50% of today's visit.  I performed medical decision making.  Carl Manson, MD

## 2022-02-21 ENCOUNTER — Telehealth: Payer: Self-pay | Admitting: *Deleted

## 2022-02-21 NOTE — Telephone Encounter (Signed)
Called to inquire if facility will allow patient to take Xeloda while in facility? Nurse will call their pharmacy and call back.

## 2022-02-21 NOTE — Telephone Encounter (Signed)
Return call from nurse,Lawyne at SNF and they will be able to administer Xeloda there. Need order faxed to facility at [phone] and pharmacy is [facility];  Fax [phone]  Phone [phone]

## 2022-02-22 ENCOUNTER — Other Ambulatory Visit: Payer: Self-pay | Admitting: *Deleted

## 2022-02-22 MED ORDER — CAPECITABINE 500 MG PO TABS: ORAL_TABLET | ORAL | 0 refills | Status: DC

## 2022-02-22 MED ORDER — PROCHLORPERAZINE MALEATE 10 MG PO TABS: 10.0000 mg | ORAL_TABLET | Freq: Four times a day (QID) | ORAL | 1 refills | Status: DC | PRN

## 2022-02-22 NOTE — Progress Notes (Signed)
Sent scripts for Xeloda and Compazine to SNF pharmacy, [place]. Faxed scripts to facility and also noted to D/C folic acid.

## 2022-02-28 ENCOUNTER — Inpatient Hospital Stay: Payer: No Typology Code available for payment source | Admitting: Nurse Practitioner

## 2022-03-01 ENCOUNTER — Encounter: Payer: Self-pay | Admitting: Nurse Practitioner

## 2022-03-01 ENCOUNTER — Inpatient Hospital Stay (HOSPITAL_BASED_OUTPATIENT_CLINIC_OR_DEPARTMENT_OTHER): Payer: No Typology Code available for payment source | Admitting: Nurse Practitioner

## 2022-03-01 ENCOUNTER — Encounter: Payer: Self-pay | Admitting: *Deleted

## 2022-03-01 VITALS — BP 128/73 | HR 68 | Temp 98.1°F | Resp 18 | Ht 69.0 in | Wt 219.0 lb

## 2022-03-01 DIAGNOSIS — C189 Malignant neoplasm of colon, unspecified: Secondary | ICD-10-CM

## 2022-03-01 NOTE — Progress Notes (Signed)
  [place] OFFICE PROGRESS NOTE   Diagnosis: Colon cancer  INTERVAL HISTORY:   Mr. Lerew returns for follow-up.  He feels the wound is healing.  No nausea or vomiting.  Colostomy is functioning.  Output at times is watery.  He has a good appetite.  Objective:  Vital signs in last 24 hours:  Blood pressure 128/73, pulse 68, temperature 98.1 F (36.7 C), temperature source Oral, resp. rate 18, height $RemoveBe'5\' 9"'IgymlSWpm$  (1.753 m), weight 219 lb (99.3 kg), SpO2 98 %.    HEENT: No thrush or ulcers. Resp: Lungs clear bilaterally. Cardio: Regular rate and rhythm. GI: Left lower quadrant colostomy with thick stool in the collection bag.  Midline wound with continued healing, superficial openings in several locations.  Thick drainage noted mainly from the superficial wound at the middle of the incision. Vascular: Trace edema lower leg bilaterally.    Lab Results:  Lab Results  Component Value Date   WBC 6.1 02/20/2022   HGB 10.6 (L) 02/20/2022   HCT 33.4 (L) 02/20/2022   MCV 75.2 (L) 02/20/2022   PLT 402 (H) 02/20/2022   NEUTROABS 3.6 02/20/2022    Imaging:  No results found.  Medications: I have reviewed the patient's current medications.  Assessment/Plan: Stage IIIb (pT4a, pN1b) well-differentiated colon adenocarcinoma -01/03/2022 CT abdomen/pelvis with contrast-2 areas of stricture in the colon, bowel obstruction related to these 2 colonic strictures -01/04/2022 CEA was 5.5 -Status post left hemicolectomy with end colostomy 01/04/2022-surgical path shows a well differentiated colon adenocarcinoma, pT4a pN1b, no loss of mismatch repair protein expression -01/15/2022 CT chest without contrast-no worrisome pulmonary nodules to suggest pulmonary metastatic disease, no mediastinal or hilar adenopathy. -Cycle 1 adjuvant Xeloda 03/04/2022 Microcytic anemia Thrombocytosis, likely reactive Leukocytosis Protein calorie malnutrition 6.  History of alcohol abuse 7.  High output  colostomy  Disposition: Mr. Utz appears stable.  The abdominal wound seems adequately healed to begin adjuvant Xeloda.  Potential toxicities reviewed with Mr. Cullinane and his caregiver.  They understand to contact the office with diarrhea or mouth sores, other concerning symptoms.  He will return for lab and follow-up in 3 weeks.  Patient seen with Dr. Benay Spice.    Ned Card ANP/GNP-BC   03/01/2022  11:17 AM This was a shared visit with Ned Card.  Mr. Moga was interviewed and examined.  The abdominal wound continues to heal.  There is a discharge from the wound, but there is no gross evidence of infection.  He is now 2 months out from the colectomy procedure.  He has a high risk of developing recurrent colon cancer.  I recommend beginning adjuvant capecitabine.  We reviewed potential toxicities associated with capecitabine again today.  I was present for greater than 50% today's visit.  I performed medical stage making.  Julieanne Manson, MD

## 2022-03-01 NOTE — Progress Notes (Signed)
Nurse, Kyra Manges at SNF reports they never received delivery of his Xeloda or did they receive the orders faxed on 8/18. Refaxed orders and called [facility] and was told the script was received and drug was sent to SNF. Was given email for [email or web address] to follow up. Sent email and requested her assistance in locating the medication and helping nursing to get this started asap.

## 2022-03-04 ENCOUNTER — Telehealth: Payer: Self-pay

## 2022-03-04 NOTE — Telephone Encounter (Signed)
NP, Suezanne Jacquet called and stated [name] start date for his Xeloda was 03/02/22.

## 2022-03-22 ENCOUNTER — Other Ambulatory Visit: Payer: Self-pay | Admitting: *Deleted

## 2022-03-22 ENCOUNTER — Encounter: Payer: Self-pay | Admitting: Nurse Practitioner

## 2022-03-22 ENCOUNTER — Inpatient Hospital Stay (HOSPITAL_BASED_OUTPATIENT_CLINIC_OR_DEPARTMENT_OTHER): Payer: No Typology Code available for payment source | Admitting: Nurse Practitioner

## 2022-03-22 ENCOUNTER — Inpatient Hospital Stay: Payer: No Typology Code available for payment source | Attending: Nurse Practitioner

## 2022-03-22 VITALS — BP 138/72 | HR 100 | Temp 98.2°F | Resp 18 | Ht 69.0 in | Wt 227.8 lb

## 2022-03-22 DIAGNOSIS — E46 Unspecified protein-calorie malnutrition: Secondary | ICD-10-CM | POA: Insufficient documentation

## 2022-03-22 DIAGNOSIS — C189 Malignant neoplasm of colon, unspecified: Secondary | ICD-10-CM | POA: Diagnosis present

## 2022-03-22 DIAGNOSIS — D72829 Elevated white blood cell count, unspecified: Secondary | ICD-10-CM | POA: Insufficient documentation

## 2022-03-22 DIAGNOSIS — D75839 Thrombocytosis, unspecified: Secondary | ICD-10-CM | POA: Insufficient documentation

## 2022-03-22 DIAGNOSIS — Z933 Colostomy status: Secondary | ICD-10-CM | POA: Diagnosis not present

## 2022-03-22 DIAGNOSIS — D509 Iron deficiency anemia, unspecified: Secondary | ICD-10-CM | POA: Insufficient documentation

## 2022-03-22 LAB — CBC WITH DIFFERENTIAL (CANCER CENTER ONLY)
Abs Immature Granulocytes: 0.03 10*3/uL (ref 0.00–0.07)
Basophils Absolute: 0.1 10*3/uL (ref 0.0–0.1)
Basophils Relative: 1 %
Eosinophils Absolute: 0.1 10*3/uL (ref 0.0–0.5)
Eosinophils Relative: 2 %
HCT: 31.2 % — ABNORMAL LOW (ref 39.0–52.0)
Hemoglobin: 10 g/dL — ABNORMAL LOW (ref 13.0–17.0)
Immature Granulocytes: 0 %
Lymphocytes Relative: 19 %
Lymphs Abs: 1.3 10*3/uL (ref 0.7–4.0)
MCH: 23.6 pg — ABNORMAL LOW (ref 26.0–34.0)
MCHC: 32.1 g/dL (ref 30.0–36.0)
MCV: 73.8 fL — ABNORMAL LOW (ref 80.0–100.0)
Monocytes Absolute: 1.9 10*3/uL — ABNORMAL HIGH (ref 0.1–1.0)
Monocytes Relative: 27 %
Neutro Abs: 3.6 10*3/uL (ref 1.7–7.7)
Neutrophils Relative %: 51 %
Platelet Count: 299 10*3/uL (ref 150–400)
RBC: 4.23 MIL/uL (ref 4.22–5.81)
RDW: 18.7 % — ABNORMAL HIGH (ref 11.5–15.5)
WBC Count: 7 10*3/uL (ref 4.0–10.5)
nRBC: 0 % (ref 0.0–0.2)

## 2022-03-22 LAB — CMP (CANCER CENTER ONLY)
ALT: 18 U/L (ref 0–44)
AST: 21 U/L (ref 15–41)
Albumin: 3.5 g/dL (ref 3.5–5.0)
Alkaline Phosphatase: 59 U/L (ref 38–126)
Anion gap: 10 (ref 5–15)
BUN: 7 mg/dL — ABNORMAL LOW (ref 8–23)
CO2: 25 mmol/L (ref 22–32)
Calcium: 9.2 mg/dL (ref 8.9–10.3)
Chloride: 102 mmol/L (ref 98–111)
Creatinine: 0.55 mg/dL — ABNORMAL LOW (ref 0.61–1.24)
GFR, Estimated: >60 mL/min (ref >60)
Glucose, Bld: 97 mg/dL (ref 70–99)
Potassium: 3.8 mmol/L (ref 3.5–5.1)
Sodium: 137 mmol/L (ref 135–145)
Total Bilirubin: 0.4 mg/dL (ref 0.3–1.2)
Total Protein: 7.6 g/dL (ref 6.5–8.1)

## 2022-03-22 MED ORDER — CAPECITABINE 500 MG PO TABS: ORAL_TABLET | ORAL | 0 refills | Status: DC

## 2022-03-22 NOTE — Progress Notes (Signed)
  [place] OFFICE PROGRESS NOTE   Diagnosis: Colon cancer  INTERVAL HISTORY:   Carl Barr returns as scheduled.  He completed cycle 1 Xeloda beginning 03/02/2022.  He denies nausea/vomiting.  No mouth sores.  No diarrhea.  No hand or foot pain or redness.  He is participating in physical therapy.  He has a good appetite.  He denies pain.  He reports the abdominal wound dressing is being changed every day.  Objective:  Vital signs in last 24 hours:  Blood pressure 138/72, pulse 100, temperature 98.2 F (36.8 C), temperature source Oral, resp. rate 18, height _0  (1.753 m), weight 227 lb 12.8 oz (103.3 kg), SpO2 98 %.    HEENT: No thrush or ulcers. Resp: Lungs clear bilaterally. Cardio: Regular rate and rhythm. GI: No hepatomegaly.  Left lower quadrant colostomy with thick partially formed stool in the collection bag.  Midline wound continues to heal, superficial openings at the mid and distal areas of the wound. Vascular: No leg edema. Skin: Palms without erythema.   Lab Results:  Lab Results  Component Value Date   WBC 7.0 03/22/2022   HGB 10.0 (L) 03/22/2022   HCT 31.2 (L) 03/22/2022   MCV 73.8 (L) 03/22/2022   PLT 299 03/22/2022   NEUTROABS 3.6 03/22/2022    Imaging:  No results found.  Medications: I have reviewed the patient's current medications.  Assessment/Plan: Stage IIIb (pT4a, pN1b) well-differentiated colon adenocarcinoma -01/03/2022 CT abdomen/pelvis with contrast-2 areas of stricture in the colon, bowel obstruction related to these 2 colonic strictures -01/04/2022 CEA was 5.5 -Status post left hemicolectomy with end colostomy 01/04/2022-surgical path shows a well differentiated colon adenocarcinoma, pT4a pN1b, no loss of mismatch repair protein expression -01/15/2022 CT chest without contrast-no worrisome pulmonary nodules to suggest pulmonary metastatic disease, no mediastinal or hilar adenopathy. -Cycle 1 adjuvant Xeloda  03/02/2022 -Cycle 2 adjuvant Xeloda 03/23/2022  Microcytic anemia Thrombocytosis, likely reactive Leukocytosis Protein calorie malnutrition 6.  History of alcohol abuse 7.  High output colostomy  Disposition: Carl Barr appears stable.  He has completed 1 cycle of adjuvant Xeloda.  Overall he tolerated well.  Plan to proceed with cycle 2 as scheduled beginning 03/23/2022.  CBC and chemistry panel reviewed.  Labs adequate to proceed as above.  He will return for lab and follow-up in 3 weeks.  We are available to see him sooner if needed.    Ned Card ANP/GNP-BC   03/22/2022  9:57 AM

## 2022-03-22 NOTE — Telephone Encounter (Signed)
Xeloda script sent to [facility] to begin on 03/23/22. Faxed copy of script and office note to facility and spoke with Danella Maiers, who will f/u to ensure it gets started.

## 2022-04-11 ENCOUNTER — Inpatient Hospital Stay: Payer: Medicare Other | Admitting: Nurse Practitioner

## 2022-04-11 ENCOUNTER — Inpatient Hospital Stay: Payer: Medicare Other

## 2022-04-24 ENCOUNTER — Inpatient Hospital Stay (HOSPITAL_BASED_OUTPATIENT_CLINIC_OR_DEPARTMENT_OTHER): Payer: Medicare Other | Admitting: Nurse Practitioner

## 2022-04-24 ENCOUNTER — Inpatient Hospital Stay: Payer: Medicare Other | Attending: Nurse Practitioner

## 2022-04-24 ENCOUNTER — Other Ambulatory Visit (HOSPITAL_COMMUNITY): Payer: Self-pay

## 2022-04-24 ENCOUNTER — Encounter: Payer: Self-pay | Admitting: Nurse Practitioner

## 2022-04-24 VITALS — BP 133/72 | HR 61 | Temp 98.2°F | Resp 18 | Ht 69.0 in | Wt 224.6 lb

## 2022-04-24 DIAGNOSIS — D72829 Elevated white blood cell count, unspecified: Secondary | ICD-10-CM | POA: Insufficient documentation

## 2022-04-24 DIAGNOSIS — D75839 Thrombocytosis, unspecified: Secondary | ICD-10-CM | POA: Diagnosis not present

## 2022-04-24 DIAGNOSIS — E46 Unspecified protein-calorie malnutrition: Secondary | ICD-10-CM | POA: Diagnosis not present

## 2022-04-24 DIAGNOSIS — Z933 Colostomy status: Secondary | ICD-10-CM | POA: Diagnosis not present

## 2022-04-24 DIAGNOSIS — C189 Malignant neoplasm of colon, unspecified: Secondary | ICD-10-CM

## 2022-04-24 DIAGNOSIS — D509 Iron deficiency anemia, unspecified: Secondary | ICD-10-CM | POA: Insufficient documentation

## 2022-04-24 LAB — CBC WITH DIFFERENTIAL (CANCER CENTER ONLY)
Abs Immature Granulocytes: 0.01 10*3/uL (ref 0.00–0.07)
Basophils Absolute: 0 10*3/uL (ref 0.0–0.1)
Basophils Relative: 1 %
Eosinophils Absolute: 0.2 10*3/uL (ref 0.0–0.5)
Eosinophils Relative: 3 %
HCT: 33.4 % — ABNORMAL LOW (ref 39.0–52.0)
Hemoglobin: 10.6 g/dL — ABNORMAL LOW (ref 13.0–17.0)
Immature Granulocytes: 0 %
Lymphocytes Relative: 23 %
Lymphs Abs: 1.3 10*3/uL (ref 0.7–4.0)
MCH: 23.2 pg — ABNORMAL LOW (ref 26.0–34.0)
MCHC: 31.7 g/dL (ref 30.0–36.0)
MCV: 73.2 fL — ABNORMAL LOW (ref 80.0–100.0)
Monocytes Absolute: 0.5 10*3/uL (ref 0.1–1.0)
Monocytes Relative: 9 %
Neutro Abs: 3.8 10*3/uL (ref 1.7–7.7)
Neutrophils Relative %: 64 %
Platelet Count: 292 10*3/uL (ref 150–400)
RBC: 4.56 MIL/uL (ref 4.22–5.81)
RDW: 19.9 % — ABNORMAL HIGH (ref 11.5–15.5)
WBC Count: 5.9 10*3/uL (ref 4.0–10.5)
nRBC: 0 % (ref 0.0–0.2)

## 2022-04-24 LAB — CMP (CANCER CENTER ONLY)
ALT: 12 U/L (ref 0–44)
AST: 16 U/L (ref 15–41)
Albumin: 4 g/dL (ref 3.5–5.0)
Alkaline Phosphatase: 65 U/L (ref 38–126)
Anion gap: 9 (ref 5–15)
BUN: 12 mg/dL (ref 8–23)
CO2: 25 mmol/L (ref 22–32)
Calcium: 9.6 mg/dL (ref 8.9–10.3)
Chloride: 105 mmol/L (ref 98–111)
Creatinine: 0.7 mg/dL (ref 0.61–1.24)
GFR, Estimated: >60 mL/min (ref >60)
Glucose, Bld: 93 mg/dL (ref 70–99)
Potassium: 3.7 mmol/L (ref 3.5–5.1)
Sodium: 139 mmol/L (ref 135–145)
Total Bilirubin: 0.4 mg/dL (ref 0.3–1.2)
Total Protein: 8.1 g/dL (ref 6.5–8.1)

## 2022-04-24 MED ORDER — CAPECITABINE 500 MG PO TABS
ORAL_TABLET | ORAL | 0 refills | Status: DC
  Filled 2022-04-24: qty 98, fill #0
  Filled 2022-04-25: qty 98, 21d supply, fill #0

## 2022-04-24 NOTE — Progress Notes (Signed)
  [place] OFFICE PROGRESS NOTE   Diagnosis: Colon cancer  INTERVAL HISTORY:   Carl Barr returns for follow-up.  He completed cycle 2 adjuvant Xeloda beginning 03/23/2022.  He missed a follow-up visit on 04/11/2022.  He reports tolerating cycle 2 well.  No nausea or vomiting.  No mouth sores.  No diarrhea.  No hand or foot pain or redness.  Objective:  Vital signs in last 24 hours:  Blood pressure 133/72, pulse 61, temperature 98.2 F (36.8 C), temperature source Oral, resp. rate 18, height $RemoveBe'5\' 9"'AbYgRiiMx$  (1.753 m), weight 224 lb 9.6 oz (101.9 kg), SpO2 100 %.    HEENT: No thrush or ulcers. Resp: Lungs clear bilaterally. Cardio: Regular rate and rhythm. GI: Abdomen soft and nontender.  No hepatomegaly.  Left lower quadrant colostomy.  Midline wound has healed. Vascular: No leg edema. Skin: Palms without erythema.   Lab Results:  Lab Results  Component Value Date   WBC 5.9 04/24/2022   HGB 10.6 (L) 04/24/2022   HCT 33.4 (L) 04/24/2022   MCV 73.2 (L) 04/24/2022   PLT 292 04/24/2022   NEUTROABS 3.8 04/24/2022    Imaging:  No results found.  Medications: I have reviewed the patient's current medications.  Assessment/Plan: Stage IIIb (pT4a, pN1b) well-differentiated colon adenocarcinoma -01/03/2022 CT abdomen/pelvis with contrast-2 areas of stricture in the colon, bowel obstruction related to these 2 colonic strictures -01/04/2022 CEA was 5.5 -Status post left hemicolectomy with end colostomy 01/04/2022-surgical path shows a well differentiated colon adenocarcinoma, pT4a pN1b, no loss of mismatch repair protein expression -01/15/2022 CT chest without contrast-no worrisome pulmonary nodules to suggest pulmonary metastatic disease, no mediastinal or hilar adenopathy. -Cycle 1 adjuvant Xeloda 03/02/2022 -Cycle 2 adjuvant Xeloda 03/23/2022 -Cycle 3 adjuvant Xeloda 04/26/2022   Microcytic anemia Thrombocytosis, likely reactive Leukocytosis Protein calorie  malnutrition 6.  History of alcohol abuse 7.  High output colostomy    Disposition: [name] appears stable.  He has completed 2 cycles of adjuvant Xeloda.  He has not started cycle 3 due to a missed follow-up appointment.  He will begin cycle 3 on 04/26/2022.  We are providing him with a Xeloda calendar.  CBC and chemistry panel reviewed.  Labs adequate to proceed with Xeloda as above.  He will return for lab and follow-up in 3 weeks.  We are available to see him sooner if needed.   Ned Card ANP/GNP-BC   04/24/2022  2:17 PM

## 2022-04-25 ENCOUNTER — Other Ambulatory Visit (HOSPITAL_COMMUNITY): Payer: Self-pay

## 2022-04-25 ENCOUNTER — Telehealth: Payer: Self-pay | Admitting: Pharmacist

## 2022-04-25 NOTE — Telephone Encounter (Signed)
Oral Oncology Pharmacist Encounter  Received new prescription for Xeloda (capecitabine) for the adjuvant treatment of stage III colon cancer, planned duration ~ 6 months.  **Of note, Rx was sent originally to an outside pharmacy and filled there starting in August. This is not a new med to the patient  CMP/CBC from 04/24/22 assessed, no relevant lab abnormalities. Prescription dose and frequency assessed.   Current medication list in Epic reviewed, no DDIs with capecitabine identified.  Evaluated chart and no patient barriers to medication adherence identified.   Prescription has been e-scribed to the [facility] for benefits analysis and approval.  Oral Oncology Clinic will continue to follow for insurance authorization, copayment issues, initial counseling and start date.  Patient agreed to treatment on 02/06/22 per MD documentation.  Darl Pikes, PharmD, BCPS, BCOP, CPP Hematology/Oncology Clinical Pharmacist Practitioner [place]/DB/AP Oral [facility] [phone]  04/25/2022 9:27 AM

## 2022-04-26 NOTE — Telephone Encounter (Signed)
Oral Chemotherapy Pharmacist Encounter  Patient Education I spoke with patient for overview of new oral chemotherapy medication: Xeloda (capecitabine) for the adjuvant treatment of stage III colon cancer, planned duration ~ 6 months.   **Of note, Rx was sent originally to an outside pharmacy and filled there starting in August. This is not a new med to the patient  Counseled patient on administration, dosing, side effects, monitoring, drug-food interactions, safe handling, storage, and disposal. Patient will take 2000 mg (#4 tablets) in am and 1500 mg (#3 tablets) in pm for 14 days on, then 7 day rest. Repeat every 21 days.  Reviewed potential side effects, include but not limited to: diarrhea, hand-foot syndrome, mouth sores, edema, decreased wbc, fatigue, N/V  Reviewed with patient importance of keeping a medication schedule and plan for any missed doses.  After discussion with patient no patient barriers to medication adherence identified.   Mr. Ewbank voiced understanding and appreciation. All questions answered. Medication handout provided.  Patient knows to call the office with questions or concerns.   Darl Pikes, PharmD, BCPS, BCOP, CPP Hematology/Oncology Clinical Pharmacist Practitioner [place]/DB/AP Oral [facility] [phone]  04/26/2022 8:59 AM

## 2022-04-29 ENCOUNTER — Other Ambulatory Visit (HOSPITAL_COMMUNITY): Payer: Self-pay

## 2022-05-14 ENCOUNTER — Other Ambulatory Visit (HOSPITAL_COMMUNITY): Payer: Self-pay

## 2022-05-14 ENCOUNTER — Other Ambulatory Visit: Payer: Self-pay | Admitting: Nurse Practitioner

## 2022-05-14 DIAGNOSIS — C189 Malignant neoplasm of colon, unspecified: Secondary | ICD-10-CM

## 2022-05-15 ENCOUNTER — Other Ambulatory Visit (HOSPITAL_COMMUNITY): Payer: Self-pay

## 2022-05-15 MED ORDER — CAPECITABINE 500 MG PO TABS
ORAL_TABLET | ORAL | 0 refills | Status: DC
  Filled 2022-05-15: qty 98, fill #0
  Filled 2022-05-15: qty 98, 21d supply, fill #0

## 2022-05-16 ENCOUNTER — Encounter: Payer: Self-pay | Admitting: *Deleted

## 2022-05-16 ENCOUNTER — Inpatient Hospital Stay: Payer: No Typology Code available for payment source | Attending: Nurse Practitioner

## 2022-05-16 ENCOUNTER — Other Ambulatory Visit: Payer: Self-pay | Admitting: *Deleted

## 2022-05-16 ENCOUNTER — Inpatient Hospital Stay (HOSPITAL_BASED_OUTPATIENT_CLINIC_OR_DEPARTMENT_OTHER): Payer: No Typology Code available for payment source | Admitting: Oncology

## 2022-05-16 VITALS — BP 135/67 | HR 80 | Temp 98.1°F | Resp 18 | Ht 69.0 in | Wt 230.0 lb

## 2022-05-16 DIAGNOSIS — Z933 Colostomy status: Secondary | ICD-10-CM | POA: Diagnosis not present

## 2022-05-16 DIAGNOSIS — D75839 Thrombocytosis, unspecified: Secondary | ICD-10-CM | POA: Insufficient documentation

## 2022-05-16 DIAGNOSIS — D72829 Elevated white blood cell count, unspecified: Secondary | ICD-10-CM | POA: Insufficient documentation

## 2022-05-16 DIAGNOSIS — C189 Malignant neoplasm of colon, unspecified: Secondary | ICD-10-CM | POA: Diagnosis present

## 2022-05-16 DIAGNOSIS — D509 Iron deficiency anemia, unspecified: Secondary | ICD-10-CM | POA: Insufficient documentation

## 2022-05-16 LAB — CMP (CANCER CENTER ONLY)
ALT: 15 U/L (ref 0–44)
AST: 16 U/L (ref 15–41)
Albumin: 3.8 g/dL (ref 3.5–5.0)
Alkaline Phosphatase: 61 U/L (ref 38–126)
Anion gap: 8 (ref 5–15)
BUN: 8 mg/dL (ref 8–23)
CO2: 28 mmol/L (ref 22–32)
Calcium: 9.8 mg/dL (ref 8.9–10.3)
Chloride: 103 mmol/L (ref 98–111)
Creatinine: 0.62 mg/dL (ref 0.61–1.24)
GFR, Estimated: >60 mL/min (ref >60)
Glucose, Bld: 99 mg/dL (ref 70–99)
Potassium: 3.9 mmol/L (ref 3.5–5.1)
Sodium: 139 mmol/L (ref 135–145)
Total Bilirubin: 0.6 mg/dL (ref 0.3–1.2)
Total Protein: 8.3 g/dL — ABNORMAL HIGH (ref 6.5–8.1)

## 2022-05-16 LAB — CBC WITH DIFFERENTIAL (CANCER CENTER ONLY)
Abs Immature Granulocytes: 0.01 10*3/uL (ref 0.00–0.07)
Basophils Absolute: 0 10*3/uL (ref 0.0–0.1)
Basophils Relative: 1 %
Eosinophils Absolute: 0.2 10*3/uL (ref 0.0–0.5)
Eosinophils Relative: 4 %
HCT: 32.2 % — ABNORMAL LOW (ref 39.0–52.0)
Hemoglobin: 10.1 g/dL — ABNORMAL LOW (ref 13.0–17.0)
Immature Granulocytes: 0 %
Lymphocytes Relative: 28 %
Lymphs Abs: 1.3 10*3/uL (ref 0.7–4.0)
MCH: 23.6 pg — ABNORMAL LOW (ref 26.0–34.0)
MCHC: 31.4 g/dL (ref 30.0–36.0)
MCV: 75.2 fL — ABNORMAL LOW (ref 80.0–100.0)
Monocytes Absolute: 0.5 10*3/uL (ref 0.1–1.0)
Monocytes Relative: 10 %
Neutro Abs: 2.7 10*3/uL (ref 1.7–7.7)
Neutrophils Relative %: 57 %
Platelet Count: 329 10*3/uL (ref 150–400)
RBC: 4.28 MIL/uL (ref 4.22–5.81)
RDW: 21.6 % — ABNORMAL HIGH (ref 11.5–15.5)
WBC Count: 4.7 10*3/uL (ref 4.0–10.5)
nRBC: 0 % (ref 0.0–0.2)

## 2022-05-16 NOTE — Progress Notes (Signed)
  [place] OFFICE PROGRESS NOTE   Diagnosis: Colon cancer  INTERVAL HISTORY:   Carl Barr returns as scheduled.  He began another cycle of Xeloda on 04/26/2022.  No mouth sores, diarrhea, or hand/foot pain.  He has dryness of both feet.  He has callus formation and nail thickening at both feet.  This predated Xeloda. The colostomy loose.  Objective:  Vital signs in last 24 hours:  Blood pressure 135/67, pulse 80, temperature 98.1 F (36.7 C), temperature source Oral, resp. rate 18, height 5' 9" (1.753 m), weight 230 lb (104.3 kg), SpO2 100 %.    HEENT: No thrush or ulcers Resp: Lungs clear bilaterally Cardio: Regular rate and rhythm GI: Healed surgical incision, no hepatosplenomegaly, nontender Vascular: Trace edema to lower leg bilaterally  Skin: Palms without erythema or skin breakdown, callus formation, dry desquamation, toenail thickening at the bilateral feet   Lab Results:  Lab Results  Component Value Date   WBC 4.7 05/16/2022   HGB 10.1 (L) 05/16/2022   HCT 32.2 (L) 05/16/2022   MCV 75.2 (L) 05/16/2022   PLT 329 05/16/2022   NEUTROABS 2.7 05/16/2022    CMP  Lab Results  Component Value Date   NA 139 04/24/2022   K 3.7 04/24/2022   CL 105 04/24/2022   CO2 25 04/24/2022   GLUCOSE 93 04/24/2022   BUN 12 04/24/2022   CREATININE 0.70 04/24/2022   CALCIUM 9.6 04/24/2022   PROT 8.1 04/24/2022   ALBUMIN 4.0 04/24/2022   AST 16 04/24/2022   ALT 12 04/24/2022   ALKPHOS 65 04/24/2022   BILITOT 0.4 04/24/2022   GFRNONAA >60 04/24/2022   GFRAA >60 08/01/2019    Lab Results  Component Value Date   CEA1 5.5 (H) 01/04/2022   CEA 1.00 02/20/2022    Lab Results  Component Value Date   INR 1.2 01/04/2022   LABPROT 15.0 01/04/2022    Imaging:  No results found.  Medications: I have reviewed the patient's current medications.   Assessment/Plan: Stage IIIb (pT4a, pN1b) well-differentiated colon adenocarcinoma -01/03/2022 CT  abdomen/pelvis with contrast-2 areas of stricture in the colon, bowel obstruction related to these 2 colonic strictures -01/04/2022 CEA was 5.5 -Status post left hemicolectomy with end colostomy 01/04/2022-surgical path shows a well differentiated colon adenocarcinoma, pT4a pN1b, no loss of mismatch repair protein expression -01/15/2022 CT chest without contrast-no worrisome pulmonary nodules to suggest pulmonary metastatic disease, no mediastinal or hilar adenopathy. -Cycle 1 adjuvant Xeloda 03/02/2022 -Cycle 2 adjuvant Xeloda 03/23/2022 -Cycle 3 adjuvant Xeloda 04/26/2022 -Cycle 4 adjuvant Xeloda 05/17/2022   Microcytic anemia Thrombocytosis, likely reactive Leukocytosis Protein calorie malnutrition 6.  History of alcohol abuse 7.  High output colostomy      Disposition: Carl Barr appears to be tolerating the Xeloda well.  He will begin cycle 3 tomorrow.  The skin and nail changes at the feet are most likely unrelated to Xeloda.  We will make a referral to podiatry. Carl Barr will return for an office and lab visit in 3 weeks.  He will call for new symptoms.  He met with an ostomy nurse today.  Betsy Coder, MD  05/16/2022  8:29 AM

## 2022-05-16 NOTE — Progress Notes (Signed)
PATIENT NAVIGATOR PROGRESS NOTE  Name: Carl Barr Date: 05/16/2022 MRN: [id number]  DOB: [birth year]   Reason for visit:  Ostomy care and Capecitabine calendar  Comments:  Met with Carl Barr during visit with Dr Benay Spice Ostomy care provided. Ostomy well healed and stoma red and healthy appearing. No skin breakdown evident. New bag applied with skin prep, protective seal applied as well. Patient fitted with two more bags with openings tailored to his ostomy.  Midline incision well healed Referral made to podiatry for hammer toe, toenail fungus and callous skin New calendar given and reviewed for next cycle of Capecitabine, reviewed pt and he verbalized understanding    Time spent counseling/coordinating care: > 60 minutes

## 2022-05-17 ENCOUNTER — Telehealth: Payer: Self-pay

## 2022-05-17 ENCOUNTER — Other Ambulatory Visit: Payer: Self-pay | Admitting: *Deleted

## 2022-05-17 DIAGNOSIS — C189 Malignant neoplasm of colon, unspecified: Secondary | ICD-10-CM

## 2022-05-17 NOTE — Telephone Encounter (Signed)
Carl Barr from [facility] called and requested an order for Physical therapy and a walker. Per Dr.Sherrill it is okay to order PT and walker.

## 2022-05-17 NOTE — Telephone Encounter (Signed)
CSW attempted to contact patient twice but was told he was unavailable.  Spoke briefly to his cousin.  Plan is to contact patient next week.

## 2022-05-20 ENCOUNTER — Telehealth: Payer: Self-pay

## 2022-05-20 NOTE — Telephone Encounter (Signed)
CSW attempted to contact patient again.  Left a vm.  Will call again another time.

## 2022-05-20 NOTE — Telephone Encounter (Signed)
CSW attempted to contact patient to assess psychosocial needs.  Left a vm.  Will call again.

## 2022-05-21 ENCOUNTER — Other Ambulatory Visit: Payer: Self-pay | Admitting: *Deleted

## 2022-05-21 DIAGNOSIS — M2042 Other hammer toe(s) (acquired), left foot: Secondary | ICD-10-CM | POA: Insufficient documentation

## 2022-05-29 ENCOUNTER — Telehealth: Payer: Self-pay | Admitting: *Deleted

## 2022-05-29 ENCOUNTER — Other Ambulatory Visit (HOSPITAL_COMMUNITY): Payer: Self-pay

## 2022-05-29 MED ORDER — THIAMINE HCL 100 MG PO TABS
100.0000 mg | ORAL_TABLET | Freq: Every day | ORAL | 3 refills | Status: DC
  Filled 2022-05-29: qty 30, 30d supply, fill #0

## 2022-05-29 MED ORDER — ENSURE ENLIVE PO LIQD
237.0000 mL | Freq: Two times a day (BID) | ORAL | 12 refills | Status: AC
  Filled 2022-05-29: qty 237, 1d supply, fill #0

## 2022-05-29 MED ORDER — PROCHLORPERAZINE MALEATE 10 MG PO TABS
10.0000 mg | ORAL_TABLET | Freq: Four times a day (QID) | ORAL | 1 refills | Status: AC | PRN
  Filled 2022-05-29: qty 30, 8d supply, fill #0

## 2022-05-29 NOTE — Telephone Encounter (Signed)
Incoming call from home health nurse that he is out of compazine, thiamine and wants a script for Ensure. He is unsure if he has Medicaid or not. Will send scripts to [facility] and he will call to arrange how to obtain them by going there or shipment.

## 2022-06-03 ENCOUNTER — Telehealth: Payer: Self-pay

## 2022-06-03 ENCOUNTER — Other Ambulatory Visit (HOSPITAL_COMMUNITY): Payer: Self-pay

## 2022-06-03 NOTE — Telephone Encounter (Signed)
Received call from home health nurse asking for verbal order for patient to have one skilled nurse visit a week for 9 weeks and a CNA visit twice a week for 9 weeks.  Dr. Benay Spice made aware and gave verbal order to ok the request.  Home Health Nurse made aware and will fax over the request once it has been finalized.

## 2022-06-05 ENCOUNTER — Inpatient Hospital Stay (HOSPITAL_BASED_OUTPATIENT_CLINIC_OR_DEPARTMENT_OTHER): Payer: No Typology Code available for payment source | Admitting: Nurse Practitioner

## 2022-06-05 ENCOUNTER — Encounter: Payer: Self-pay | Admitting: Nurse Practitioner

## 2022-06-05 ENCOUNTER — Other Ambulatory Visit (HOSPITAL_COMMUNITY): Payer: Self-pay

## 2022-06-05 ENCOUNTER — Other Ambulatory Visit: Payer: Self-pay | Admitting: Oncology

## 2022-06-05 ENCOUNTER — Inpatient Hospital Stay: Payer: No Typology Code available for payment source | Admitting: Nurse Practitioner

## 2022-06-05 ENCOUNTER — Inpatient Hospital Stay: Payer: No Typology Code available for payment source

## 2022-06-05 VITALS — BP 121/76 | HR 60 | Temp 98.2°F | Resp 18 | Ht 69.0 in | Wt 227.6 lb

## 2022-06-05 DIAGNOSIS — C189 Malignant neoplasm of colon, unspecified: Secondary | ICD-10-CM

## 2022-06-05 LAB — CBC WITH DIFFERENTIAL (CANCER CENTER ONLY)
Abs Immature Granulocytes: 0.01 10*3/uL (ref 0.00–0.07)
Basophils Absolute: 0 10*3/uL (ref 0.0–0.1)
Basophils Relative: 1 %
Eosinophils Absolute: 0.3 10*3/uL (ref 0.0–0.5)
Eosinophils Relative: 6 %
HCT: 36 % — ABNORMAL LOW (ref 39.0–52.0)
Hemoglobin: 11.4 g/dL — ABNORMAL LOW (ref 13.0–17.0)
Immature Granulocytes: 0 %
Lymphocytes Relative: 28 %
Lymphs Abs: 1.4 10*3/uL (ref 0.7–4.0)
MCH: 24.2 pg — ABNORMAL LOW (ref 26.0–34.0)
MCHC: 31.7 g/dL (ref 30.0–36.0)
MCV: 76.4 fL — ABNORMAL LOW (ref 80.0–100.0)
Monocytes Absolute: 0.7 10*3/uL (ref 0.1–1.0)
Monocytes Relative: 14 %
Neutro Abs: 2.6 10*3/uL (ref 1.7–7.7)
Neutrophils Relative %: 51 %
Platelet Count: 301 10*3/uL (ref 150–400)
RBC: 4.71 MIL/uL (ref 4.22–5.81)
RDW: 23.9 % — ABNORMAL HIGH (ref 11.5–15.5)
WBC Count: 5.1 10*3/uL (ref 4.0–10.5)
nRBC: 0 % (ref 0.0–0.2)

## 2022-06-05 LAB — CMP (CANCER CENTER ONLY)
ALT: 16 U/L (ref 0–44)
AST: 20 U/L (ref 15–41)
Albumin: 3.9 g/dL (ref 3.5–5.0)
Alkaline Phosphatase: 65 U/L (ref 38–126)
Anion gap: 8 (ref 5–15)
BUN: 10 mg/dL (ref 8–23)
CO2: 27 mmol/L (ref 22–32)
Calcium: 9.6 mg/dL (ref 8.9–10.3)
Chloride: 103 mmol/L (ref 98–111)
Creatinine: 0.57 mg/dL — ABNORMAL LOW (ref 0.61–1.24)
GFR, Estimated: >60 mL/min (ref >60)
Glucose, Bld: 101 mg/dL — ABNORMAL HIGH (ref 70–99)
Potassium: 3.9 mmol/L (ref 3.5–5.1)
Sodium: 138 mmol/L (ref 135–145)
Total Bilirubin: 0.8 mg/dL (ref 0.3–1.2)
Total Protein: 8.2 g/dL — ABNORMAL HIGH (ref 6.5–8.1)

## 2022-06-05 MED ORDER — CAPECITABINE 500 MG PO TABS
ORAL_TABLET | ORAL | 0 refills | Status: DC
  Filled 2022-06-05: qty 98, 21d supply, fill #0

## 2022-06-05 NOTE — Progress Notes (Signed)
  [place] OFFICE PROGRESS NOTE   Diagnosis: Colon cancer  INTERVAL HISTORY:   Mr. Strauch returns as scheduled.  He completed cycle 4 adjuvant Xeloda beginning 05/17/2022.  He denies nausea/vomiting.  No mouth sores.  No diarrhea.  No hand or foot pain or redness.  Objective:  Vital signs in last 24 hours:  Blood pressure 121/76, pulse 60, temperature 98.2 F (36.8 C), temperature source Oral, resp. rate 18, height _0  (1.753 m), weight 227 lb 9.6 oz (103.2 kg), SpO2 100 %.    HEENT: No thrush or ulcers. Resp: Lungs clear bilaterally. Cardio: Regular rate and rhythm. GI: Abdomen soft and nontender.  No hepatomegaly.  Left lower quadrant colostomy. Vascular: No leg edema. Skin: Palms without erythema.   Lab Results:  Lab Results  Component Value Date   WBC 5.1 06/05/2022   HGB 11.4 (L) 06/05/2022   HCT 36.0 (L) 06/05/2022   MCV 76.4 (L) 06/05/2022   PLT 301 06/05/2022   NEUTROABS 2.6 06/05/2022    Imaging:  No results found.  Medications: I have reviewed the patient's current medications.  Assessment/Plan: Stage IIIb (pT4a, pN1b) well-differentiated colon adenocarcinoma -01/03/2022 CT abdomen/pelvis with contrast-2 areas of stricture in the colon, bowel obstruction related to these 2 colonic strictures -01/04/2022 CEA was 5.5 -Status post left hemicolectomy with end colostomy 01/04/2022-surgical path shows a well differentiated colon adenocarcinoma, pT4a pN1b, no loss of mismatch repair protein expression -01/15/2022 CT chest without contrast-no worrisome pulmonary nodules to suggest pulmonary metastatic disease, no mediastinal or hilar adenopathy. -Cycle 1 adjuvant Xeloda 03/02/2022 -Cycle 2 adjuvant Xeloda 03/23/2022 -Cycle 3 adjuvant Xeloda 04/26/2022 -Cycle 4 adjuvant Xeloda 05/17/2022 -Cycle 5 adjuvant Xeloda 06/07/2022   Microcytic anemia Thrombocytosis, likely reactive Leukocytosis Protein calorie malnutrition 6.  History of alcohol  abuse 7.  High output colostomy  Disposition: Mr. Kretschmer appears stable.  He has completed 4 cycles of adjuvant Xeloda.  He continues to tolerate chemotherapy well.  He will begin cycle 5 on 06/07/2022.  CBC reviewed.  Counts adequate to proceed with Xeloda as above.  He will return for lab and follow-up in 3 weeks.    Ned Card ANP/GNP-BC   06/05/2022  1:30 PM

## 2022-06-06 ENCOUNTER — Other Ambulatory Visit (HOSPITAL_COMMUNITY): Payer: Self-pay

## 2022-06-10 ENCOUNTER — Telehealth: Payer: Self-pay

## 2022-06-10 NOTE — Telephone Encounter (Signed)
CSW attempted to contact patient to assess psychosocial needs.  His vm was full and CSW could not leave a message.

## 2022-06-14 ENCOUNTER — Ambulatory Visit: Payer: Medicare Other | Admitting: Podiatry

## 2022-06-18 ENCOUNTER — Other Ambulatory Visit (HOSPITAL_COMMUNITY): Payer: Self-pay

## 2022-06-18 ENCOUNTER — Other Ambulatory Visit: Payer: Self-pay | Admitting: Oncology

## 2022-06-18 DIAGNOSIS — C189 Malignant neoplasm of colon, unspecified: Secondary | ICD-10-CM

## 2022-06-18 MED ORDER — CAPECITABINE 500 MG PO TABS
ORAL_TABLET | ORAL | 0 refills | Status: DC
  Filled 2022-06-18 – 2022-06-21 (×2): qty 98, 21d supply, fill #0

## 2022-06-21 ENCOUNTER — Other Ambulatory Visit (HOSPITAL_COMMUNITY): Payer: Self-pay

## 2022-06-21 ENCOUNTER — Other Ambulatory Visit: Payer: Self-pay

## 2022-06-24 ENCOUNTER — Other Ambulatory Visit (HOSPITAL_COMMUNITY): Payer: Self-pay

## 2022-06-25 ENCOUNTER — Other Ambulatory Visit: Payer: Self-pay

## 2022-06-25 ENCOUNTER — Other Ambulatory Visit (HOSPITAL_COMMUNITY): Payer: Self-pay

## 2022-06-26 ENCOUNTER — Other Ambulatory Visit (HOSPITAL_COMMUNITY): Payer: Self-pay

## 2022-06-26 ENCOUNTER — Inpatient Hospital Stay (HOSPITAL_BASED_OUTPATIENT_CLINIC_OR_DEPARTMENT_OTHER): Payer: Medicare Other | Admitting: Nurse Practitioner

## 2022-06-26 ENCOUNTER — Encounter: Payer: Self-pay | Admitting: Nurse Practitioner

## 2022-06-26 ENCOUNTER — Inpatient Hospital Stay: Payer: Medicare Other | Attending: Nurse Practitioner

## 2022-06-26 ENCOUNTER — Other Ambulatory Visit: Payer: Self-pay

## 2022-06-26 VITALS — BP 143/75 | HR 69 | Temp 98.2°F | Resp 18 | Ht 69.0 in | Wt 221.0 lb

## 2022-06-26 DIAGNOSIS — C189 Malignant neoplasm of colon, unspecified: Secondary | ICD-10-CM

## 2022-06-26 DIAGNOSIS — D72829 Elevated white blood cell count, unspecified: Secondary | ICD-10-CM | POA: Diagnosis not present

## 2022-06-26 DIAGNOSIS — E46 Unspecified protein-calorie malnutrition: Secondary | ICD-10-CM | POA: Diagnosis not present

## 2022-06-26 DIAGNOSIS — D509 Iron deficiency anemia, unspecified: Secondary | ICD-10-CM | POA: Diagnosis not present

## 2022-06-26 DIAGNOSIS — D75839 Thrombocytosis, unspecified: Secondary | ICD-10-CM | POA: Insufficient documentation

## 2022-06-26 LAB — CBC WITH DIFFERENTIAL (CANCER CENTER ONLY)
Abs Immature Granulocytes: 0.01 10*3/uL (ref 0.00–0.07)
Basophils Absolute: 0.1 10*3/uL (ref 0.0–0.1)
Basophils Relative: 1 %
Eosinophils Absolute: 0.2 10*3/uL (ref 0.0–0.5)
Eosinophils Relative: 4 %
HCT: 35.9 % — ABNORMAL LOW (ref 39.0–52.0)
Hemoglobin: 11.3 g/dL — ABNORMAL LOW (ref 13.0–17.0)
Immature Granulocytes: 0 %
Lymphocytes Relative: 23 %
Lymphs Abs: 1.2 10*3/uL (ref 0.7–4.0)
MCH: 25.2 pg — ABNORMAL LOW (ref 26.0–34.0)
MCHC: 31.5 g/dL (ref 30.0–36.0)
MCV: 80 fL (ref 80.0–100.0)
Monocytes Absolute: 0.6 10*3/uL (ref 0.1–1.0)
Monocytes Relative: 11 %
Neutro Abs: 3.2 10*3/uL (ref 1.7–7.7)
Neutrophils Relative %: 61 %
Platelet Count: 309 10*3/uL (ref 150–400)
RBC: 4.49 MIL/uL (ref 4.22–5.81)
RDW: 23.8 % — ABNORMAL HIGH (ref 11.5–15.5)
WBC Count: 5.3 10*3/uL (ref 4.0–10.5)
nRBC: 0 % (ref 0.0–0.2)

## 2022-06-26 LAB — CMP (CANCER CENTER ONLY)
ALT: 12 U/L (ref 0–44)
AST: 18 U/L (ref 15–41)
Albumin: 4.2 g/dL (ref 3.5–5.0)
Alkaline Phosphatase: 71 U/L (ref 38–126)
Anion gap: 8 (ref 5–15)
BUN: 10 mg/dL (ref 8–23)
CO2: 29 mmol/L (ref 22–32)
Calcium: 9.9 mg/dL (ref 8.9–10.3)
Chloride: 103 mmol/L (ref 98–111)
Creatinine: 0.73 mg/dL (ref 0.61–1.24)
GFR, Estimated: >60 mL/min (ref >60)
Glucose, Bld: 95 mg/dL (ref 70–99)
Potassium: 4 mmol/L (ref 3.5–5.1)
Sodium: 140 mmol/L (ref 135–145)
Total Bilirubin: 0.6 mg/dL (ref 0.3–1.2)
Total Protein: 8.4 g/dL — ABNORMAL HIGH (ref 6.5–8.1)

## 2022-06-26 MED ORDER — CAPECITABINE 500 MG PO TABS
ORAL_TABLET | ORAL | 0 refills | Status: DC
  Filled 2022-06-26: qty 98, fill #0

## 2022-06-26 NOTE — Progress Notes (Signed)
  [place] OFFICE PROGRESS NOTE   Diagnosis: Colon cancer  INTERVAL HISTORY:   [name] returns as scheduled.  He completed cycle 5 adjuvant Xeloda beginning 06/07/2022.  He denies nausea/vomiting.  No mouth sores.  No diarrhea.  No hand or foot pain or redness.  Objective:  Vital signs in last 24 hours:  Blood pressure (!) 143/75, pulse 69, temperature 98.2 F (36.8 C), temperature source Oral, resp. rate 18, height _0  (1.753 m), weight 221 lb (100.2 kg), SpO2 100 %.    HEENT: No thrush or ulcers. Resp: Lungs clear bilaterally. Cardio: Regular rate and rhythm. GI: Abdomen soft and nontender.  No hepatomegaly.  Left lower quadrant colostomy. Vascular: No leg edema. Skin: Palms without erythema.   Lab Results:  Lab Results  Component Value Date   WBC 5.3 06/26/2022   HGB 11.3 (L) 06/26/2022   HCT 35.9 (L) 06/26/2022   MCV 80.0 06/26/2022   PLT 309 06/26/2022   NEUTROABS 3.2 06/26/2022    Imaging:  No results found.  Medications: I have reviewed the patient's current medications.  Assessment/Plan: Stage IIIb (pT4a, pN1b) well-differentiated colon adenocarcinoma -01/03/2022 CT abdomen/pelvis with contrast-2 areas of stricture in the colon, bowel obstruction related to these 2 colonic strictures -01/04/2022 CEA was 5.5 -Status post left hemicolectomy with end colostomy 01/04/2022-surgical path shows a well differentiated colon adenocarcinoma, pT4a pN1b, no loss of mismatch repair protein expression -01/15/2022 CT chest without contrast-no worrisome pulmonary nodules to suggest pulmonary metastatic disease, no mediastinal or hilar adenopathy. -Cycle 1 adjuvant Xeloda 03/02/2022 -Cycle 2 adjuvant Xeloda 03/23/2022 -Cycle 3 adjuvant Xeloda 04/26/2022 -Cycle 4 adjuvant Xeloda 05/17/2022 -Cycle 5 adjuvant Xeloda 06/07/2022 -Cycle 6 adjuvant Xeloda 06/28/2022   Microcytic anemia Thrombocytosis, likely reactive Leukocytosis Protein calorie  malnutrition 6.  History of alcohol abuse 7.  High output colostomy  Disposition: [name] appears stable.  He has completed 5 cycles of adjuvant Xeloda.  He is tolerating chemotherapy well.  He will begin cycle 6 as scheduled on 06/28/2022.  CBC and chemistry panel reviewed.  Labs adequate to proceed with Xeloda as above.  He will return for follow-up in 3 weeks.  We are available to see him sooner if needed.    Ned Card ANP/GNP-BC   06/26/2022  1:39 PM

## 2022-06-26 NOTE — Addendum Note (Signed)
Addended by: Velna Hatchet on: 06/26/2022 02:06 PM   Modules accepted: Orders

## 2022-07-05 ENCOUNTER — Telehealth: Payer: Self-pay | Admitting: *Deleted

## 2022-07-05 NOTE — Telephone Encounter (Signed)
Call from nurse w/[facility]. Patient still requires assistance w/ostomy care and are requested 2 more weekly visits for RN and for one visit for CSW. Gave nurse verbal order OK for above requests.

## 2022-07-15 ENCOUNTER — Inpatient Hospital Stay: Payer: Medicare Other | Admitting: Oncology

## 2022-07-15 ENCOUNTER — Inpatient Hospital Stay: Payer: Medicare Other

## 2022-07-16 ENCOUNTER — Other Ambulatory Visit (HOSPITAL_COMMUNITY): Payer: Self-pay

## 2022-07-18 ENCOUNTER — Other Ambulatory Visit (HOSPITAL_COMMUNITY): Payer: Self-pay

## 2022-07-19 ENCOUNTER — Other Ambulatory Visit: Payer: Self-pay

## 2022-07-19 ENCOUNTER — Inpatient Hospital Stay (HOSPITAL_BASED_OUTPATIENT_CLINIC_OR_DEPARTMENT_OTHER): Payer: Medicare Other | Admitting: Nurse Practitioner

## 2022-07-19 ENCOUNTER — Other Ambulatory Visit (HOSPITAL_COMMUNITY): Payer: Self-pay

## 2022-07-19 ENCOUNTER — Encounter: Payer: Self-pay | Admitting: Nurse Practitioner

## 2022-07-19 ENCOUNTER — Inpatient Hospital Stay: Payer: Medicare Other | Attending: Nurse Practitioner

## 2022-07-19 VITALS — BP 143/69 | HR 66 | Temp 98.1°F | Resp 18 | Ht 69.0 in | Wt 220.0 lb

## 2022-07-19 DIAGNOSIS — C189 Malignant neoplasm of colon, unspecified: Secondary | ICD-10-CM

## 2022-07-19 DIAGNOSIS — Z933 Colostomy status: Secondary | ICD-10-CM | POA: Insufficient documentation

## 2022-07-19 DIAGNOSIS — C186 Malignant neoplasm of descending colon: Secondary | ICD-10-CM | POA: Diagnosis not present

## 2022-07-19 DIAGNOSIS — E46 Unspecified protein-calorie malnutrition: Secondary | ICD-10-CM | POA: Diagnosis not present

## 2022-07-19 DIAGNOSIS — D75839 Thrombocytosis, unspecified: Secondary | ICD-10-CM | POA: Diagnosis not present

## 2022-07-19 DIAGNOSIS — F1011 Alcohol abuse, in remission: Secondary | ICD-10-CM | POA: Insufficient documentation

## 2022-07-19 DIAGNOSIS — D509 Iron deficiency anemia, unspecified: Secondary | ICD-10-CM | POA: Diagnosis not present

## 2022-07-19 DIAGNOSIS — D72829 Elevated white blood cell count, unspecified: Secondary | ICD-10-CM | POA: Insufficient documentation

## 2022-07-19 LAB — CBC WITH DIFFERENTIAL (CANCER CENTER ONLY)
Abs Immature Granulocytes: 0.01 10*3/uL (ref 0.00–0.07)
Basophils Absolute: 0 10*3/uL (ref 0.0–0.1)
Basophils Relative: 1 %
Eosinophils Absolute: 0.2 10*3/uL (ref 0.0–0.5)
Eosinophils Relative: 3 %
HCT: 35.2 % — ABNORMAL LOW (ref 39.0–52.0)
Hemoglobin: 11.3 g/dL — ABNORMAL LOW (ref 13.0–17.0)
Immature Granulocytes: 0 %
Lymphocytes Relative: 22 %
Lymphs Abs: 1.2 10*3/uL (ref 0.7–4.0)
MCH: 26.2 pg (ref 26.0–34.0)
MCHC: 32.1 g/dL (ref 30.0–36.0)
MCV: 81.7 fL (ref 80.0–100.0)
Monocytes Absolute: 0.6 10*3/uL (ref 0.1–1.0)
Monocytes Relative: 11 %
Neutro Abs: 3.6 10*3/uL (ref 1.7–7.7)
Neutrophils Relative %: 63 %
Platelet Count: 303 10*3/uL (ref 150–400)
RBC: 4.31 MIL/uL (ref 4.22–5.81)
RDW: 23.8 % — ABNORMAL HIGH (ref 11.5–15.5)
WBC Count: 5.7 10*3/uL (ref 4.0–10.5)
nRBC: 0 % (ref 0.0–0.2)

## 2022-07-19 LAB — CMP (CANCER CENTER ONLY)
ALT: 13 U/L (ref 0–44)
AST: 17 U/L (ref 15–41)
Albumin: 3.9 g/dL (ref 3.5–5.0)
Alkaline Phosphatase: 72 U/L (ref 38–126)
Anion gap: 6 (ref 5–15)
BUN: 9 mg/dL (ref 8–23)
CO2: 28 mmol/L (ref 22–32)
Calcium: 9.7 mg/dL (ref 8.9–10.3)
Chloride: 105 mmol/L (ref 98–111)
Creatinine: 0.64 mg/dL (ref 0.61–1.24)
GFR, Estimated: >60 mL/min (ref >60)
Glucose, Bld: 94 mg/dL (ref 70–99)
Potassium: 3.7 mmol/L (ref 3.5–5.1)
Sodium: 139 mmol/L (ref 135–145)
Total Bilirubin: 0.9 mg/dL (ref 0.3–1.2)
Total Protein: 8.4 g/dL — ABNORMAL HIGH (ref 6.5–8.1)

## 2022-07-19 MED ORDER — CAPECITABINE 500 MG PO TABS
ORAL_TABLET | ORAL | 0 refills | Status: DC
  Filled 2022-07-19: qty 98, 21d supply, fill #0
  Filled 2022-07-19: qty 98, fill #0

## 2022-07-19 NOTE — Progress Notes (Signed)
  [place] OFFICE PROGRESS NOTE   Diagnosis: Colon cancer  INTERVAL HISTORY:   Carl Barr returns as scheduled.  He completed cycle 6 adjuvant Xeloda beginning 06/28/2022.  He denies nausea/vomiting.  No mouth sores.  No diarrhea.  No hand or foot pain or redness.  He reports feet are chronically dry.  He intermittently applies lotion.    Objective:  Vital signs in last 24 hours:  Blood pressure (!) 143/69, pulse 66, temperature 98.1 F (36.7 C), temperature source Oral, resp. rate 18, height '5\' 9"'$  (1.753 m), weight 220 lb (99.8 kg), SpO2 98 %.    HEENT: No thrush or ulcers.  Mucous membranes are moist. Resp: Lungs clear bilaterally. Cardio: Regular rate and rhythm. GI: Abdomen soft and nontender.  No hepatomegaly.  Left lower quadrant colostomy. Vascular: No leg edema. Skin: Palms without erythema.  Soles and lower legs with dry desquamation.   Lab Results:  Lab Results  Component Value Date   WBC 5.7 07/19/2022   HGB 11.3 (L) 07/19/2022   HCT 35.2 (L) 07/19/2022   MCV 81.7 07/19/2022   PLT 303 07/19/2022   NEUTROABS 3.6 07/19/2022    Imaging:  No results found.  Medications: I have reviewed the patient's current medications.  Assessment/Plan: Stage IIIb (pT4a, pN1b) well-differentiated colon adenocarcinoma -01/03/2022 CT abdomen/pelvis with contrast-2 areas of stricture in the colon, bowel obstruction related to these 2 colonic strictures -01/04/2022 CEA was 5.5 -Status post left hemicolectomy with end colostomy 01/04/2022-surgical path shows a well differentiated colon adenocarcinoma, pT4a pN1b, no loss of mismatch repair protein expression -01/15/2022 CT chest without contrast-no worrisome pulmonary nodules to suggest pulmonary metastatic disease, no mediastinal or hilar adenopathy. -Cycle 1 adjuvant Xeloda 03/02/2022 -Cycle 2 adjuvant Xeloda 03/23/2022 -Cycle 3 adjuvant Xeloda 04/26/2022 -Cycle 4 adjuvant Xeloda 05/17/2022 -Cycle 5 adjuvant  Xeloda 06/07/2022 -Cycle 6 adjuvant Xeloda 06/28/2022 -Cycle 7 adjuvant Xeloda 07/22/2022   Microcytic anemia Thrombocytosis, likely reactive Leukocytosis Protein calorie malnutrition 6.  History of alcohol abuse 7.  High output colostomy  Disposition: Mr. Bramer appears stable.  He has completed 6 cycles of adjuvant Xeloda.  He continues to tolerate treatment well.  He will begin cycle 7 on 07/22/2022.  CBC and chemistry panel reviewed.  Labs adequate to proceed with Xeloda as above.  He will return for lab and follow-up in 3 weeks.  He will contact the office in the interim with any problems.  Ned Card ANP/GNP-BC   07/19/2022  1:19 PM

## 2022-07-22 ENCOUNTER — Other Ambulatory Visit: Payer: Self-pay

## 2022-07-29 ENCOUNTER — Other Ambulatory Visit (HOSPITAL_COMMUNITY): Payer: Self-pay

## 2022-07-29 ENCOUNTER — Other Ambulatory Visit: Payer: Self-pay

## 2022-07-29 ENCOUNTER — Other Ambulatory Visit: Payer: Self-pay | Admitting: Nurse Practitioner

## 2022-07-29 DIAGNOSIS — C189 Malignant neoplasm of colon, unspecified: Secondary | ICD-10-CM

## 2022-07-29 MED ORDER — CAPECITABINE 500 MG PO TABS
ORAL_TABLET | ORAL | 0 refills | Status: DC
  Filled 2022-07-30: qty 98, 21d supply, fill #0

## 2022-07-30 ENCOUNTER — Other Ambulatory Visit (HOSPITAL_COMMUNITY): Payer: Self-pay

## 2022-08-05 ENCOUNTER — Telehealth: Payer: Self-pay | Admitting: Oncology

## 2022-08-05 NOTE — Telephone Encounter (Signed)
TC from nurse(Lorraine)[phone] with [facility] stating she would need verbal orders for Skilled nursing visits for 9 weeks and OT evaluation. Returned call to nurse to confirm message. She stated that they will fax the orders over to be signed.

## 2022-08-06 ENCOUNTER — Inpatient Hospital Stay: Payer: Medicare Other

## 2022-08-06 ENCOUNTER — Other Ambulatory Visit (HOSPITAL_COMMUNITY): Payer: Self-pay

## 2022-08-06 ENCOUNTER — Inpatient Hospital Stay (HOSPITAL_BASED_OUTPATIENT_CLINIC_OR_DEPARTMENT_OTHER): Payer: Medicare Other | Admitting: Oncology

## 2022-08-06 VITALS — BP 131/80 | HR 72 | Temp 98.7°F | Resp 18 | Ht 69.0 in | Wt 217.0 lb

## 2022-08-06 DIAGNOSIS — C189 Malignant neoplasm of colon, unspecified: Secondary | ICD-10-CM

## 2022-08-06 DIAGNOSIS — C186 Malignant neoplasm of descending colon: Secondary | ICD-10-CM | POA: Diagnosis not present

## 2022-08-06 LAB — CBC WITH DIFFERENTIAL (CANCER CENTER ONLY)
Abs Immature Granulocytes: 0.03 10*3/uL (ref 0.00–0.07)
Basophils Absolute: 0.1 10*3/uL (ref 0.0–0.1)
Basophils Relative: 1 %
Eosinophils Absolute: 0.3 10*3/uL (ref 0.0–0.5)
Eosinophils Relative: 4 %
HCT: 37.1 % — ABNORMAL LOW (ref 39.0–52.0)
Hemoglobin: 12.2 g/dL — ABNORMAL LOW (ref 13.0–17.0)
Immature Granulocytes: 0 %
Lymphocytes Relative: 19 %
Lymphs Abs: 1.3 10*3/uL (ref 0.7–4.0)
MCH: 26.6 pg (ref 26.0–34.0)
MCHC: 32.9 g/dL (ref 30.0–36.0)
MCV: 81 fL (ref 80.0–100.0)
Monocytes Absolute: 0.7 10*3/uL (ref 0.1–1.0)
Monocytes Relative: 10 %
Neutro Abs: 4.7 10*3/uL (ref 1.7–7.7)
Neutrophils Relative %: 66 %
Platelet Count: 320 10*3/uL (ref 150–400)
RBC: 4.58 MIL/uL (ref 4.22–5.81)
RDW: 23.2 % — ABNORMAL HIGH (ref 11.5–15.5)
WBC Count: 7 10*3/uL (ref 4.0–10.5)
nRBC: 0 % (ref 0.0–0.2)

## 2022-08-06 LAB — CMP (CANCER CENTER ONLY)
ALT: 10 U/L (ref 0–44)
AST: 15 U/L (ref 15–41)
Albumin: 4 g/dL (ref 3.5–5.0)
Alkaline Phosphatase: 77 U/L (ref 38–126)
Anion gap: 8 (ref 5–15)
BUN: 12 mg/dL (ref 8–23)
CO2: 26 mmol/L (ref 22–32)
Calcium: 10 mg/dL (ref 8.9–10.3)
Chloride: 105 mmol/L (ref 98–111)
Creatinine: 0.61 mg/dL (ref 0.61–1.24)
GFR, Estimated: >60 mL/min (ref >60)
Glucose, Bld: 108 mg/dL — ABNORMAL HIGH (ref 70–99)
Potassium: 3.7 mmol/L (ref 3.5–5.1)
Sodium: 139 mmol/L (ref 135–145)
Total Bilirubin: 0.8 mg/dL (ref 0.3–1.2)
Total Protein: 8.6 g/dL — ABNORMAL HIGH (ref 6.5–8.1)

## 2022-08-06 LAB — CEA (ACCESS): CEA (CHCC): <1 ng/mL (ref 0.00–5.00)

## 2022-08-06 NOTE — Progress Notes (Signed)
Carl Barr Clinical Social Work  Initial Assessment   Carl Barr is a 67 y.o. year old male presenting alone. Clinical Social Work was referred by nurse for assessment of psychosocial needs.   SDOH (Social Determinants of Health) assessments performed: Yes   SDOH Screenings   Depression (PHQ2-9): Low Risk  (12/16/2018)  Tobacco Use: Medium Risk (07/19/2022)     Distress Screen completed: No     No data to display            Family/Social Information:  Housing Arrangement: Patient lives with family members, including a cousin. Family members/support persons in your life? Family and Friends Transportation concerns: yes  Employment: Out on work excuse Patient stated he works in [employer] at [employer], but has not been able to work since his cancer diagnosis.  Patient was reluctant to provide his financial status.  Income source: Banker concerns: Yes, due to illness and/or loss of work during treatment Type of concern: Archivist access concerns: yes Religious or spiritual practice: Not known Services Currently in place:  Medicare  Coping/ Adjustment to diagnosis: Patient understands treatment plan and what happens next? yes Concerns about diagnosis and/or treatment: How will I care for myself Patient reported stressors: Housing, Transportation, and [employer] and/or priorities: To work again. Patient enjoys time with family/ friends Current coping skills/ strengths: Capable of independent living  and Supportive family/friends     SUMMARY: Current SDOH Barriers:  Transportation and Limited access to food  Clinical Social Work Clinical Goal(s):  Explore community resource options for unmet needs related to:  Chief Executive Officer Insecurity   Interventions: Discussed common feeling and emotions when being diagnosed with cancer, and the importance of support during treatment Informed patient of the support team roles and  support services at [facility] Provided [place] contact information and encouraged patient to call with any questions or concerns Provided patient with information about the [employer] and [employer].  Provided patient with one [place] gift card.  Gave patient a thirty day bus pass.  Offered to obtain bus information for his appointment next Tues with the Podiatrist, but he declined.  CSW to apply for transportation through the [employer].   Discussed nursing facilities and patient declined.  He reported having home health services  Patient was given a bag of food from the food pantry.  Will refer patient for the [employer].  Patient stated he has an apartment ready for him through [employer], but he does not want to move yet.  Since he currently gives his cousin money to stay with him, he is concerned about his cousin's financial situation if he moves out.     Follow Up Plan: CSW will follow-up with patient by phone  Patient verbalizes understanding of plan: Yes    Carl Pickle Ohana Birdwell, [name]

## 2022-08-06 NOTE — Progress Notes (Signed)
  [place] OFFICE PROGRESS NOTE   Diagnosis: Colon cancer  INTERVAL HISTORY:   Carl. Platten returns as scheduled.  He completed on cycle Xeloda beginning 07/22/2022.  He believes he missed several doses of Xeloda.  No mouth sores, nausea, or diarrhea.  He continues to have dry desquamation of the skin at his legs and feet.  He is having difficulty obtaining colostomy bags.  Objective:  Vital signs in last 24 hours:  Blood pressure 131/80, pulse 72, temperature 98.7 F (37.1 C), temperature source Oral, resp. rate 18, height '5\' 9"'$  (1.753 m), weight 217 lb (98.4 kg), SpO2 99 %.    HEENT: No thrush or ulcers Resp: Breath sounds, no respiatory distress Cardio: Regular rhythm, distant heart sounds GI: No hepatomegaly, left lower quadrant colostomy healed midline incision Vascular: No leg edema, the left lower leg is larger than the right side  Skin: Anus in the hands, thick dry skin with desquamation over the legs and feet   Lab Results:  Lab Results  Component Value Date   WBC 7.0 08/06/2022   HGB 12.2 (L) 08/06/2022   HCT 37.1 (L) 08/06/2022   MCV 81.0 08/06/2022   PLT 320 08/06/2022   NEUTROABS 4.7 08/06/2022    CMP  Lab Results  Component Value Date   NA 139 08/06/2022   K 3.7 08/06/2022   CL 105 08/06/2022   CO2 26 08/06/2022   GLUCOSE 108 (H) 08/06/2022   BUN 12 08/06/2022   CREATININE 0.61 08/06/2022   CALCIUM 10.0 08/06/2022   PROT 8.6 (H) 08/06/2022   ALBUMIN 4.0 08/06/2022   AST 15 08/06/2022   ALT 10 08/06/2022   ALKPHOS 77 08/06/2022   BILITOT 0.8 08/06/2022   GFRNONAA >60 08/06/2022   GFRAA >60 08/01/2019    Lab Results  Component Value Date   CEA1 5.5 (H) 01/04/2022   CEA 1.00 02/20/2022   Medications: I have reviewed the patient's current medications.   Assessment/Plan: Stage IIIb (pT4a, pN1b) well-differentiated colon adenocarcinoma -01/03/2022 CT abdomen/pelvis with contrast-2 areas of stricture in the colon, bowel  obstruction related to these 2 colonic strictures -01/04/2022 CEA was 5.5 -Status post left hemicolectomy with end colostomy 01/04/2022-surgical path shows a well differentiated colon adenocarcinoma, pT4a pN1b, no loss of mismatch repair protein expression -01/15/2022 CT chest without contrast-no worrisome pulmonary nodules to suggest pulmonary metastatic disease, no mediastinal or hilar adenopathy. -Cycle 1 adjuvant Xeloda 03/02/2022 -Cycle 2 adjuvant Xeloda 03/23/2022 -Cycle 3 adjuvant Xeloda 04/26/2022 -Cycle 4 adjuvant Xeloda 05/17/2022 -Cycle 5 adjuvant Xeloda 06/07/2022 -Cycle 6 adjuvant Xeloda 06/28/2022 -Cycle 7 adjuvant Xeloda 07/22/2022 -Cycle 8 adjuvant Xeloda 08/12/2022   Microcytic anemia Thrombocytosis, likely reactive Leukocytosis Protein calorie malnutrition 6.  History of alcohol abuse 7.  High output colostomy    Disposition: Carl Barr has completed 7 cycles of adjuvant Xeloda.  Is tolerated Xeloda well.  He will complete cycle 8 beginning 08/12/2022.  We will refer him to the ostomy clinic.  We have previously referred him to podiatry to help with the skin changes over the feet.  These changes predated chemotherapy.  He will return for an office visit in 3-4 weeks.  We will plan for surveillance imaging within the next few months.  Betsy Coder, MD  08/06/2022  9:57 AM

## 2022-08-07 ENCOUNTER — Other Ambulatory Visit: Payer: Self-pay

## 2022-08-07 ENCOUNTER — Other Ambulatory Visit (HOSPITAL_COMMUNITY): Payer: Self-pay

## 2022-08-07 NOTE — Progress Notes (Signed)
Papers faxed to [employer] for certification Case # [id number] with confirmation received 08/07/2022.    Fax No. [phone]

## 2022-08-09 ENCOUNTER — Inpatient Hospital Stay: Payer: Medicare Other | Attending: Nurse Practitioner

## 2022-08-09 DIAGNOSIS — Z933 Colostomy status: Secondary | ICD-10-CM | POA: Insufficient documentation

## 2022-08-09 DIAGNOSIS — F1011 Alcohol abuse, in remission: Secondary | ICD-10-CM | POA: Insufficient documentation

## 2022-08-09 DIAGNOSIS — C188 Malignant neoplasm of overlapping sites of colon: Secondary | ICD-10-CM | POA: Insufficient documentation

## 2022-08-09 NOTE — Progress Notes (Signed)
[place] CSW Progress Note  Clinical Education officer, museum contacted caregiver by phone to assess needs.  Patient stated he needed furniture for the apartment he is going to move into.  CSW is contacting [employer].  CSW also made referrals through [facility] for meals on wheals and transportation.  [facility] may be able to assist with colostomy care at home.  CSW is having difficulty determining patient's eligibility for services due to his income.  CSW mailed a form for a government free phone per his request to his Carl Barr, [address] [place], [place] [address], [phone].  Rodman Pickle Audria Takeshita, [name]

## 2022-08-12 ENCOUNTER — Telehealth: Payer: Self-pay

## 2022-08-12 NOTE — Telephone Encounter (Signed)
CSW attempted to contact patient regarding his transportation to the [facility] [place] tomorrow for an 8:30am appt.  [employer] is scheduled to pick him up between 7:30am and 8:am.  CSW left a vm at [phone].  Also left a vm with his neighbor, Lennox Solders, at [phone], regarding this information.

## 2022-08-13 ENCOUNTER — Ambulatory Visit: Payer: Medicare Other | Admitting: Podiatry

## 2022-08-13 ENCOUNTER — Inpatient Hospital Stay: Payer: Medicare Other | Admitting: Licensed Clinical Social Worker

## 2022-08-13 NOTE — Progress Notes (Signed)
[place] CSW Progress Note  Clinical Education officer, museum  returned patient's call  to assess needs.  He stated his cab never arrived this morning to take him to the [facility].  CSW contacted [facility] whom the reservation was made with.  They stated they had no record of the reservation.  Informed Nurse Navigator of incident.    Carl Pickle Riham Polyakov, [name]

## 2022-08-21 ENCOUNTER — Other Ambulatory Visit (HOSPITAL_COMMUNITY): Payer: Self-pay

## 2022-08-23 ENCOUNTER — Other Ambulatory Visit: Payer: Medicare Other

## 2022-08-23 ENCOUNTER — Telehealth: Payer: Self-pay

## 2022-08-23 NOTE — Telephone Encounter (Signed)
CSW attempted to contact patient to assess needs and to determine if he has moved yet.  Left a vm with his friend.

## 2022-08-26 ENCOUNTER — Other Ambulatory Visit (HOSPITAL_COMMUNITY): Payer: Self-pay

## 2022-08-28 ENCOUNTER — Other Ambulatory Visit: Payer: Self-pay | Admitting: Oncology

## 2022-08-28 ENCOUNTER — Inpatient Hospital Stay: Payer: Medicare Other | Admitting: Licensed Clinical Social Worker

## 2022-08-28 ENCOUNTER — Other Ambulatory Visit (HOSPITAL_COMMUNITY): Payer: Self-pay

## 2022-08-28 DIAGNOSIS — C189 Malignant neoplasm of colon, unspecified: Secondary | ICD-10-CM

## 2022-08-28 NOTE — Progress Notes (Signed)
[name] Progress Note  Clinical Education officer, museum returned patient's call.  He inquired about his podiatrist appointment.  CSW to contact nurse navigator to discuss.  He said he has an apartment but hasn't moved in yet.  Informed him of the procedure for the [facility] to obtain furniture.  His cousin will provide CSW with the address and photos tomorrow.  Plan is for CSW to call patient tomorrow to obtain information.  Patient expressed no other needs.   Carl Pickle Riad Wagley, [name]

## 2022-08-29 ENCOUNTER — Other Ambulatory Visit (HOSPITAL_COMMUNITY): Payer: Self-pay

## 2022-08-29 ENCOUNTER — Telehealth: Payer: Self-pay

## 2022-08-29 ENCOUNTER — Inpatient Hospital Stay (HOSPITAL_BASED_OUTPATIENT_CLINIC_OR_DEPARTMENT_OTHER): Payer: Medicare Other | Admitting: Oncology

## 2022-08-29 ENCOUNTER — Inpatient Hospital Stay: Payer: Medicare Other

## 2022-08-29 VITALS — BP 120/69 | HR 96 | Temp 99.5°F | Resp 20 | Wt 218.2 lb

## 2022-08-29 DIAGNOSIS — C189 Malignant neoplasm of colon, unspecified: Secondary | ICD-10-CM | POA: Diagnosis not present

## 2022-08-29 DIAGNOSIS — F1011 Alcohol abuse, in remission: Secondary | ICD-10-CM | POA: Diagnosis not present

## 2022-08-29 DIAGNOSIS — C188 Malignant neoplasm of overlapping sites of colon: Secondary | ICD-10-CM | POA: Diagnosis present

## 2022-08-29 DIAGNOSIS — Z933 Colostomy status: Secondary | ICD-10-CM | POA: Diagnosis not present

## 2022-08-29 LAB — CBC WITH DIFFERENTIAL (CANCER CENTER ONLY)
Abs Immature Granulocytes: 0.02 10*3/uL (ref 0.00–0.07)
Basophils Absolute: 0 10*3/uL (ref 0.0–0.1)
Basophils Relative: 1 %
Eosinophils Absolute: 0.2 10*3/uL (ref 0.0–0.5)
Eosinophils Relative: 3 %
HCT: 36.9 % — ABNORMAL LOW (ref 39.0–52.0)
Hemoglobin: 12.2 g/dL — ABNORMAL LOW (ref 13.0–17.0)
Immature Granulocytes: 0 %
Lymphocytes Relative: 10 %
Lymphs Abs: 0.7 10*3/uL (ref 0.7–4.0)
MCH: 27.2 pg (ref 26.0–34.0)
MCHC: 33.1 g/dL (ref 30.0–36.0)
MCV: 82.2 fL (ref 80.0–100.0)
Monocytes Absolute: 1.1 10*3/uL — ABNORMAL HIGH (ref 0.1–1.0)
Monocytes Relative: 16 %
Neutro Abs: 4.8 10*3/uL (ref 1.7–7.7)
Neutrophils Relative %: 70 %
Platelet Count: 236 10*3/uL (ref 150–400)
RBC: 4.49 MIL/uL (ref 4.22–5.81)
RDW: 22.7 % — ABNORMAL HIGH (ref 11.5–15.5)
WBC Count: 6.9 10*3/uL (ref 4.0–10.5)
nRBC: 0 % (ref 0.0–0.2)

## 2022-08-29 LAB — CMP (CANCER CENTER ONLY)
ALT: 22 U/L (ref 0–44)
AST: 29 U/L (ref 15–41)
Albumin: 3.8 g/dL (ref 3.5–5.0)
Alkaline Phosphatase: 79 U/L (ref 38–126)
Anion gap: 14 (ref 5–15)
BUN: 7 mg/dL — ABNORMAL LOW (ref 8–23)
CO2: 20 mmol/L — ABNORMAL LOW (ref 22–32)
Calcium: 9.4 mg/dL (ref 8.9–10.3)
Chloride: 107 mmol/L (ref 98–111)
Creatinine: 0.67 mg/dL (ref 0.61–1.24)
GFR, Estimated: >60 mL/min (ref >60)
Glucose, Bld: 101 mg/dL — ABNORMAL HIGH (ref 70–99)
Potassium: 3.7 mmol/L (ref 3.5–5.1)
Sodium: 141 mmol/L (ref 135–145)
Total Bilirubin: 0.5 mg/dL (ref 0.3–1.2)
Total Protein: 8.2 g/dL — ABNORMAL HIGH (ref 6.5–8.1)

## 2022-08-29 LAB — CEA (ACCESS): CEA (CHCC): 1.02 ng/mL (ref 0.00–5.00)

## 2022-08-29 NOTE — Progress Notes (Signed)
[facility] OFFICE PROGRESS NOTE   Diagnosis: Colon cancer  INTERVAL HISTORY:   Carl Barr completed another cycle of Xeloda beginning 08/12/2022.  No mouth sores or diarrhea.  The colostomy is functioning.  He has difficulty changing the ostomy appliance.  He continues to have dryness and skin thickening of the feet.  He has not seen a podiatrist.  He is working with social work to arrange for a new living situation.  Objective:  Vital signs in last 24 hours:  Blood pressure 120/69, pulse 96, temperature 99.5 F (37.5 C), temperature source Oral, resp. rate 20, weight 218 lb 3.2 oz (99 kg), SpO2 99 %.    HEENT: No thrush or ulcers Lymphatics: No cervical, supraclavicular, axillary, or inguinal nodes Resp: Lungs clear bilaterally Cardio: Distant heart sounds, regular rhythm GI: No hepatosplenomegaly, no mass, nontender Vascular: No leg edema, left lower leg is larger than the right side  Skin: Dryness of the hands, skin thickening and superficial dry desquamation of the soles   Lab Results:  Lab Results  Component Value Date   WBC 6.9 08/29/2022   HGB 12.2 (L) 08/29/2022   HCT 36.9 (L) 08/29/2022   MCV 82.2 08/29/2022   PLT 236 08/29/2022   NEUTROABS 4.8 08/29/2022    CMP  Lab Results  Component Value Date   NA 139 08/06/2022   K 3.7 08/06/2022   CL 105 08/06/2022   CO2 26 08/06/2022   GLUCOSE 108 (H) 08/06/2022   BUN 12 08/06/2022   CREATININE 0.61 08/06/2022   CALCIUM 10.0 08/06/2022   PROT 8.6 (H) 08/06/2022   ALBUMIN 4.0 08/06/2022   AST 15 08/06/2022   ALT 10 08/06/2022   ALKPHOS 77 08/06/2022   BILITOT 0.8 08/06/2022   GFRNONAA >60 08/06/2022   GFRAA >60 08/01/2019    Lab Results  Component Value Date   CEA1 5.5 (H) 01/04/2022   CEA 1.02 08/29/2022    Lab Results  Component Value Date   INR 1.2 01/04/2022   LABPROT 15.0 01/04/2022    Imaging:  No results found.  Medications: I have reviewed the patient's current  medications.   Assessment/Plan: Stage IIIb (pT4a, pN1b) well-differentiated colon adenocarcinoma -01/03/2022 CT abdomen/pelvis with contrast-2 areas of stricture in the colon, bowel obstruction related to these 2 colonic strictures -01/04/2022 CEA was 5.5 -Status post left hemicolectomy with end colostomy 01/04/2022-surgical path shows a well differentiated colon adenocarcinoma, pT4a pN1b, no loss of mismatch repair protein expression -01/15/2022 CT chest without contrast-no worrisome pulmonary nodules to suggest pulmonary metastatic disease, no mediastinal or hilar adenopathy. -Cycle 1 adjuvant Xeloda 03/02/2022 -Cycle 2 adjuvant Xeloda 03/23/2022 -Cycle 3 adjuvant Xeloda 04/26/2022 -Cycle 4 adjuvant Xeloda 05/17/2022 -Cycle 5 adjuvant Xeloda 06/07/2022 -Cycle 6 adjuvant Xeloda 06/28/2022 -Cycle 7 adjuvant Xeloda 07/22/2022 -Cycle 8 adjuvant Xeloda 08/12/2022   Microcytic anemia Thrombocytosis, likely reactive Leukocytosis Protein calorie malnutrition 6.  History of alcohol abuse 7.  History of a high output colostomy     Disposition: Carl. Barr has completed a course of adjuvant capecitabine.  He is in clinical remission from colon cancer.  He will be referred for restaging CTs and an office visit in 2 months. He is scheduled for an appointment with an ostomy nurse this afternoon.  He would like to consider having the colostomy reversed.  We will consult surgery after the restaging CTs. He has been referred to podiatry on multiple occasions, but has not scheduled appointment.  The nursing staff assisted him in scheduling an appointment  today.  Betsy Coder, MD  08/29/2022  12:16 PM

## 2022-08-29 NOTE — Telephone Encounter (Signed)
CSW attempted to contact patient to obtain the address for his new apartment.  Could not leave a vm.

## 2022-09-18 NOTE — Telephone Encounter (Signed)
Left vm.  Attempted to contact patient.

## 2022-10-11 ENCOUNTER — Encounter: Payer: Self-pay | Admitting: *Deleted

## 2022-10-11 NOTE — Progress Notes (Signed)
Faxed signed ostomy supply orders to [facility] [phone]

## 2022-10-21 ENCOUNTER — Ambulatory Visit (HOSPITAL_BASED_OUTPATIENT_CLINIC_OR_DEPARTMENT_OTHER)
Admission: RE | Admit: 2022-10-21 | Discharge: 2022-10-21 | Disposition: A | Discharge status: Home / Self Care | Payer: Medicare Other | Source: Ambulatory Visit | Attending: Oncology | Admitting: Oncology

## 2022-10-21 ENCOUNTER — Other Ambulatory Visit: Payer: Self-pay | Admitting: *Deleted

## 2022-10-21 ENCOUNTER — Inpatient Hospital Stay: Payer: Medicare Other

## 2022-10-21 ENCOUNTER — Inpatient Hospital Stay: Payer: Medicare Other | Attending: Nurse Practitioner

## 2022-10-21 ENCOUNTER — Ambulatory Visit (HOSPITAL_BASED_OUTPATIENT_CLINIC_OR_DEPARTMENT_OTHER): Payer: Medicare Other

## 2022-10-21 ENCOUNTER — Inpatient Hospital Stay (HOSPITAL_BASED_OUTPATIENT_CLINIC_OR_DEPARTMENT_OTHER): Payer: Medicare Other | Admitting: Oncology

## 2022-10-21 ENCOUNTER — Encounter (HOSPITAL_BASED_OUTPATIENT_CLINIC_OR_DEPARTMENT_OTHER): Payer: Self-pay

## 2022-10-21 VITALS — BP 139/78 | HR 71 | Temp 98.1°F | Resp 20 | Ht 69.0 in | Wt 222.2 lb

## 2022-10-21 DIAGNOSIS — C189 Malignant neoplasm of colon, unspecified: Secondary | ICD-10-CM

## 2022-10-21 DIAGNOSIS — D75839 Thrombocytosis, unspecified: Secondary | ICD-10-CM | POA: Diagnosis not present

## 2022-10-21 DIAGNOSIS — Z85038 Personal history of other malignant neoplasm of large intestine: Secondary | ICD-10-CM | POA: Diagnosis present

## 2022-10-21 DIAGNOSIS — D72829 Elevated white blood cell count, unspecified: Secondary | ICD-10-CM | POA: Diagnosis not present

## 2022-10-21 DIAGNOSIS — D509 Iron deficiency anemia, unspecified: Secondary | ICD-10-CM | POA: Diagnosis not present

## 2022-10-21 DIAGNOSIS — E46 Unspecified protein-calorie malnutrition: Secondary | ICD-10-CM | POA: Insufficient documentation

## 2022-10-21 DIAGNOSIS — Z933 Colostomy status: Secondary | ICD-10-CM | POA: Diagnosis not present

## 2022-10-21 DIAGNOSIS — Z08 Encounter for follow-up examination after completed treatment for malignant neoplasm: Secondary | ICD-10-CM | POA: Diagnosis present

## 2022-10-21 LAB — BASIC METABOLIC PANEL - CANCER CENTER ONLY
Anion gap: 7 (ref 5–15)
BUN: 9 mg/dL (ref 8–23)
CO2: 26 mmol/L (ref 22–32)
Calcium: 9.6 mg/dL (ref 8.9–10.3)
Chloride: 105 mmol/L (ref 98–111)
Creatinine: 0.57 mg/dL — ABNORMAL LOW (ref 0.61–1.24)
GFR, Estimated: >60 mL/min (ref >60)
Glucose, Bld: 100 mg/dL — ABNORMAL HIGH (ref 70–99)
Potassium: 3.8 mmol/L (ref 3.5–5.1)
Sodium: 138 mmol/L (ref 135–145)

## 2022-10-21 MED ORDER — IOHEXOL 300 MG/ML  SOLN
100.0000 mL | Freq: Once | INTRAMUSCULAR | Status: AC | PRN
  Administered 2022-10-21: 85 mL via INTRAVENOUS

## 2022-10-21 NOTE — Progress Notes (Signed)
Faxed referral order, demographics and chart information to [facility], Dr. Janee Morn to consider for colostomy reversal.

## 2022-10-21 NOTE — Progress Notes (Signed)
  [facility] OFFICE PROGRESS NOTE   Diagnosis: Colon cancer  INTERVAL HISTORY:   Carl Barr returns as scheduled.  He feels well.  The colostomy is functioning well.  Skin changes at the feet have partially improved.  He is now "soaking "his feet.  He has moved into a new residence.  Objective:  Vital signs in last 24 hours:  Blood pressure 139/78, pulse 71, temperature 98.1 F (36.7 C), temperature source Oral, resp. rate 20, height 5\' 9"  (1.753 m), weight 222 lb 3.2 oz (100.8 kg), SpO2 100 %.    Lymphatics: No cervical, supraclavicular, axillary, or inguinal nodes Resp: Lungs clear bilaterally Cardio: Regular rate and rhythm GI: No mass, no hepatosplenomegaly, left lower quadrant colostomy with brown stool Vascular: No leg edema  Skin: Dryness of the palms, callus formation with dryness of the soles.  Toenail hypertrophy.  Lab Results:  Lab Results  Component Value Date   WBC 6.9 08/29/2022   HGB 12.2 (L) 08/29/2022   HCT 36.9 (L) 08/29/2022   MCV 82.2 08/29/2022   PLT 236 08/29/2022   NEUTROABS 4.8 08/29/2022    CMP  Lab Results  Component Value Date   NA 138 10/21/2022   K 3.8 10/21/2022   CL 105 10/21/2022   CO2 26 10/21/2022   GLUCOSE 100 (H) 10/21/2022   BUN 9 10/21/2022   CREATININE 0.57 (L) 10/21/2022   CALCIUM 9.6 10/21/2022   PROT 8.2 (H) 08/29/2022   ALBUMIN 3.8 08/29/2022   AST 29 08/29/2022   ALT 22 08/29/2022   ALKPHOS 79 08/29/2022   BILITOT 0.5 08/29/2022   GFRNONAA >60 10/21/2022   GFRAA >60 08/01/2019    Lab Results  Component Value Date   CEA1 5.5 (H) 01/04/2022   CEA 1.02 08/29/2022    Lab Results  Component Value Date   INR 1.2 01/04/2022   LABPROT 15.0 01/04/2022    Imaging:  No results found.  Medications: I have reviewed the patient's current medications.   Assessment/Plan: Stage IIIb (pT4a, pN1b) well-differentiated colon adenocarcinoma -01/03/2022 CT abdomen/pelvis with contrast-2 areas of  stricture in the colon, bowel obstruction related to these 2 colonic strictures -01/04/2022 CEA was 5.5 -Status post left hemicolectomy with end colostomy 01/04/2022-surgical path shows a well differentiated colon adenocarcinoma, pT4a pN1b, no loss of mismatch repair protein expression -01/15/2022 CT chest without contrast-no worrisome pulmonary nodules to suggest pulmonary metastatic disease, no mediastinal or hilar adenopathy. -Cycle 1 adjuvant Xeloda 03/02/2022 -Cycle 2 adjuvant Xeloda 03/23/2022 -Cycle 3 adjuvant Xeloda 04/26/2022 -Cycle 4 adjuvant Xeloda 05/17/2022 -Cycle 5 adjuvant Xeloda 06/07/2022 -Cycle 6 adjuvant Xeloda 06/28/2022 -Cycle 7 adjuvant Xeloda 07/22/2022 -Cycle 8 adjuvant Xeloda 08/12/2022   Microcytic anemia Thrombocytosis, likely reactive Leukocytosis Protein calorie malnutrition 6.  History of alcohol abuse 7.  History of a high output colostomy    Disposition: Carl Barr is in clinical remission from colon cancer.  He underwent surveillance imaging earlier today.  We will follow-up on the CT results.  We will refer him to Dr. Janee Morn to consider colostomy reversal if the CTs reveal no evidence of recurrent disease.  Carl Barr is scheduled to be seen in the podiatry clinic.  He will return for an office visit and CEA in 3 months.  Thornton Papas, MD  10/21/2022  1:56 PM

## 2022-10-21 NOTE — Progress Notes (Signed)
Rescheduled Mr. Ghent with [facility] for 10/30/23 at 1:30 pm/arrive at 1:15. Provided patient the information, address and phone #. Left message for CSW to see if she can arrange Medicaid transport for him.

## 2022-10-21 NOTE — Progress Notes (Signed)
[facility] CSW Progress Note  Clinical Child psychotherapist contacted patient by phone to discuss transportation options per the request of Kem Boroughs, Charity fundraiser.  He has an appointment with Dr. Nicholes Rough at the [facility] on 4/24 at 1:15.  Contacted Medicaid.  He will need to have a Medicaid Transportation Assessment done before he can use his transportation benefit.  They are to call patient and schedule, then he can schedule the appointment with MD.      Dorothey Baseman, [name]    Patient is participating in a Managed Medicaid Plan:  Yes

## 2022-10-22 ENCOUNTER — Encounter: Payer: Self-pay | Admitting: *Deleted

## 2022-10-22 NOTE — Progress Notes (Signed)
Faxed referral order, demographics and chart info to [facility], Dr. Janee Morn. [phone]

## 2022-10-23 ENCOUNTER — Telehealth: Payer: Self-pay

## 2022-10-23 NOTE — Telephone Encounter (Signed)
-----   Message from Ladene Artist, MD sent at 10/22/2022  7:45 PM EDT ----- Please call patient CTs are negative for cancer, schedule appt.with Dr Janee Morn to consider colostomy reversal

## 2022-10-23 NOTE — Telephone Encounter (Signed)
Patient gave verbal understanding and had no further questions or concerns. I will work on getting the patient schedule with Dr. Janee Morn.

## 2022-10-29 ENCOUNTER — Inpatient Hospital Stay: Payer: Medicare Other | Admitting: Licensed Clinical Social Worker

## 2022-10-29 NOTE — Progress Notes (Signed)
[facility] CSW Progress Note  Clinical Child psychotherapist contacted patient by phone to assess needs.  Patient is scheduled to see Dr. Nicholes Rough tomorrow, 4/24, at 1:30 at the [facility].  Contacted DSS transportation since patient has Medicaid.  Carl Barr stated they have not conducted the [facility] Transportation Assessment yet, so patient cannot utilize his transportation benefit.  CSW spoke with [name] at [facility] to arrange transportation for patient tomorrow.  Informed patient that he will be picked up at 12:45 and he said he understood.      Carl Lux Wania Longstreth, [name]    Patient is participating in a Managed Medicaid Plan:  Yes

## 2022-10-30 ENCOUNTER — Ambulatory Visit (INDEPENDENT_AMBULATORY_CARE_PROVIDER_SITE_OTHER): Payer: Medicare Other | Admitting: Podiatry

## 2022-10-30 ENCOUNTER — Inpatient Hospital Stay: Payer: Medicare Other | Admitting: Licensed Clinical Social Worker

## 2022-10-30 DIAGNOSIS — M79674 Pain in right toe(s): Secondary | ICD-10-CM | POA: Diagnosis not present

## 2022-10-30 DIAGNOSIS — M79675 Pain in left toe(s): Secondary | ICD-10-CM | POA: Diagnosis not present

## 2022-10-30 DIAGNOSIS — B351 Tinea unguium: Secondary | ICD-10-CM

## 2022-10-30 NOTE — Progress Notes (Signed)
  Subjective:  Patient ID: Carl Barr, male    DOB: 1956-02-22,  MRN: [id number]  Chief Complaint  Patient presents with   Nail Problem    NAIL TRIM   67 y.o. male returns for the above complaint.  Patient presents with thickened elongated dystrophic mycotic toenails x 10 mild pain on palpation hurts with ambulation worse with pressure.  he would like for me to debride down he is not able to do it himself  Objective:  There were no vitals filed for this visit. Podiatric Exam: Vascular: dorsalis pedis and posterior tibial pulses are palpable bilateral. Capillary return is immediate. Temperature gradient is WNL. Skin turgor WNL  Sensorium: Normal Semmes Weinstein monofilament test. Normal tactile sensation bilaterally. Nail Exam: Pt has thick disfigured discolored nails with subungual debris noted bilateral entire nail hallux through fifth toenails.  Pain on palpation to the nails. Ulcer Exam: There is no evidence of ulcer or pre-ulcerative changes or infection. Orthopedic Exam: Muscle tone and strength are WNL. No limitations in general ROM. No crepitus or effusions noted.  Skin: No Porokeratosis. No infection or ulcers    Assessment & Plan:   1. Pain due to onychomycosis of toenails of both feet     Patient was evaluated and treated and all questions answered.  Onychomycosis with pain  -Nails palliatively debrided as below. -Educated on self-care  Procedure: Nail Debridement Rationale: pain  Type of Debridement: manual, sharp debridement. Instrumentation: Nail nipper, rotary burr. Number of Nails: 10  Procedures and Treatment: Consent by patient was obtained for treatment procedures. The patient understood the discussion of treatment and procedures well. All questions were answered thoroughly reviewed. Debridement of mycotic and hypertrophic toenails, 1 through 5 bilateral and clearing of subungual debris. No ulceration, no infection noted.  Return Visit-Office Procedure:  Patient instructed to return to the office for a follow up visit 3 months for continued evaluation and treatment.  Nicholes Rough, DPM    No follow-ups on file.

## 2022-10-30 NOTE — Progress Notes (Signed)
[facility] CSW Progress Note  Clinical Child psychotherapist contacted patient by phone to verify he was able to get to the [facility] doctor today.  He stated he was at the office waiting to see the doctor.  He had no other needs.    Pascal Lux Kayana Thoen, [name]

## 2022-11-06 ENCOUNTER — Inpatient Hospital Stay: Payer: Medicare Other | Attending: Nurse Practitioner | Admitting: Licensed Clinical Social Worker

## 2022-11-06 NOTE — Progress Notes (Signed)
[facility] CSW Progress Note  Clinical Child psychotherapist contacted patient by phone to inform him he had an appointment with [facility] at 10:50 today.  He stated he understood.    Dorothey Baseman, [name] Clinical Social Worker [facility]    Patient is participating in a Managed Medicaid Plan:  Yes

## 2023-01-20 ENCOUNTER — Inpatient Hospital Stay: Payer: Medicare Other | Admitting: Oncology

## 2023-01-20 ENCOUNTER — Inpatient Hospital Stay: Payer: Medicare Other | Attending: Nurse Practitioner

## 2023-01-20 ENCOUNTER — Other Ambulatory Visit: Payer: Self-pay | Admitting: *Deleted

## 2023-01-20 VITALS — BP 154/81 | HR 70 | Temp 97.4°F | Resp 18 | Wt 221.3 lb

## 2023-01-20 DIAGNOSIS — D75839 Thrombocytosis, unspecified: Secondary | ICD-10-CM | POA: Diagnosis not present

## 2023-01-20 DIAGNOSIS — Z08 Encounter for follow-up examination after completed treatment for malignant neoplasm: Secondary | ICD-10-CM | POA: Diagnosis present

## 2023-01-20 DIAGNOSIS — Z85038 Personal history of other malignant neoplasm of large intestine: Secondary | ICD-10-CM | POA: Insufficient documentation

## 2023-01-20 DIAGNOSIS — E46 Unspecified protein-calorie malnutrition: Secondary | ICD-10-CM | POA: Diagnosis not present

## 2023-01-20 DIAGNOSIS — D72829 Elevated white blood cell count, unspecified: Secondary | ICD-10-CM | POA: Diagnosis not present

## 2023-01-20 DIAGNOSIS — D509 Iron deficiency anemia, unspecified: Secondary | ICD-10-CM | POA: Diagnosis not present

## 2023-01-20 DIAGNOSIS — Z933 Colostomy status: Secondary | ICD-10-CM | POA: Insufficient documentation

## 2023-01-20 DIAGNOSIS — C189 Malignant neoplasm of colon, unspecified: Secondary | ICD-10-CM

## 2023-01-20 LAB — CEA (ACCESS): CEA (CHCC): 1.01 ng/mL (ref 0.00–5.00)

## 2023-01-20 NOTE — Progress Notes (Signed)
Referral placed with [facility] for colonoscopy after colon cancer

## 2023-01-20 NOTE — Progress Notes (Signed)
  [facility] OFFICE PROGRESS NOTE   Diagnosis: Colon cancer  INTERVAL HISTORY:   Carl Barr returns as scheduled.  Good appetite.  He reports the colostomy is functioning well.  No new complaint.  He saw the podiatrist to have his nails trimmed.  He has difficulty reaching his feet to apply lotion. He saw Dr. Janee Morn to discuss colostomy takedown but remains undecided on this procedure. Objective:  Vital signs in last 24 hours:  Blood pressure (!) 154/81, pulse 70, temperature (!) 97.4 F (36.3 C), temperature source Temporal, resp. rate 18, weight 221 lb 4.8 oz (100.4 kg), SpO2 99%.   Lymphatics: No cervical, supraclavicular, axillary, or inguinal nodes Resp: Lungs clear bilaterally Cardio: Regular rate and rhythm GI: Nontender, no mass, no hepatosplenomegaly Vascular: No leg edema, the left lower leg is slightly larger than the right side  Skin: Callus formation and dryness at the soles bilaterally   Lab Results:  Lab Results  Component Value Date   WBC 6.9 08/29/2022   HGB 12.2 (L) 08/29/2022   HCT 36.9 (L) 08/29/2022   MCV 82.2 08/29/2022   PLT 236 08/29/2022   NEUTROABS 4.8 08/29/2022    CMP  Lab Results  Component Value Date   NA 138 10/21/2022   K 3.8 10/21/2022   CL 105 10/21/2022   CO2 26 10/21/2022   GLUCOSE 100 (H) 10/21/2022   BUN 9 10/21/2022   CREATININE 0.57 (L) 10/21/2022   CALCIUM 9.6 10/21/2022   PROT 8.2 (H) 08/29/2022   ALBUMIN 3.8 08/29/2022   AST 29 08/29/2022   ALT 22 08/29/2022   ALKPHOS 79 08/29/2022   BILITOT 0.5 08/29/2022   GFRNONAA >60 10/21/2022   GFRAA >60 08/01/2019    Lab Results  Component Value Date   CEA1 5.5 (H) 01/04/2022   CEA 1.01 01/20/2023      Medications: I have reviewed the patient's current medications.   Assessment/Plan: Stage IIIb (pT4a, pN1b) well-differentiated colon adenocarcinoma -01/03/2022 CT abdomen/pelvis with contrast-2 areas of stricture in the colon, bowel obstruction  related to these 2 colonic strictures -01/04/2022 CEA was 5.5 -Status post left hemicolectomy with end colostomy 01/04/2022-surgical path shows a well differentiated colon adenocarcinoma, pT4a pN1b, no loss of mismatch repair protein expression -01/15/2022 CT chest without contrast-no worrisome pulmonary nodules to suggest pulmonary metastatic disease, no mediastinal or hilar adenopathy. -Cycle 1 adjuvant Xeloda 03/02/2022 -Cycle 2 adjuvant Xeloda 03/23/2022 -Cycle 3 adjuvant Xeloda 04/26/2022 -Cycle 4 adjuvant Xeloda 05/17/2022 -Cycle 5 adjuvant Xeloda 06/07/2022 -Cycle 6 adjuvant Xeloda 06/28/2022 -Cycle 7 adjuvant Xeloda 07/22/2022 -Cycle 8 adjuvant Xeloda 08/12/2022 -CTs 10/21/2022-no evidence of metastatic disease   Microcytic anemia Thrombocytosis, likely reactive Leukocytosis Protein calorie malnutrition 6.  History of alcohol abuse 7.  History of a high output colostomy     Disposition: Carl Barr is in clinical remission from colon cancer.  We will follow-up on the CEA from today.  He will return for an office visit and surveillance CTs in 3 months.  We will refer him for a colonoscopy.  Thornton Papas, MD  01/20/2023  10:27 AM

## 2023-02-27 ENCOUNTER — Other Ambulatory Visit (HOSPITAL_COMMUNITY): Payer: Self-pay

## 2023-03-05 ENCOUNTER — Encounter: Payer: Self-pay | Admitting: *Deleted

## 2023-03-05 NOTE — Progress Notes (Signed)
[facility] notes they have left VM for [name] to return call to schedule appointment. Attempted to reach him without success. Mailed referral order with telephone number for [facility] for him to call for an appointment.

## 2023-03-20 ENCOUNTER — Ambulatory Visit: Payer: Medicare Other | Admitting: Family Medicine

## 2023-03-25 ENCOUNTER — Telehealth: Payer: Self-pay | Admitting: Gastroenterology

## 2023-03-25 NOTE — Telephone Encounter (Signed)
Good Morning Dr Barron Alvine   Supervising MD AM   We have received a referral for patient to be seen for Adenocarcinoma of the colon. Patient is in need of a colonoscopy.   Patient has never had procedure and has a colosomy. Please review and advise on scheduling.   Thank you

## 2023-04-01 ENCOUNTER — Ambulatory Visit (INDEPENDENT_AMBULATORY_CARE_PROVIDER_SITE_OTHER): Payer: Medicare Other | Admitting: Nurse Practitioner

## 2023-04-01 ENCOUNTER — Other Ambulatory Visit (INDEPENDENT_AMBULATORY_CARE_PROVIDER_SITE_OTHER): Payer: Medicare Other

## 2023-04-01 ENCOUNTER — Encounter: Payer: Self-pay | Admitting: Nurse Practitioner

## 2023-04-01 VITALS — BP 102/60 | HR 77 | Ht 67.0 in | Wt 214.0 lb

## 2023-04-01 DIAGNOSIS — C189 Malignant neoplasm of colon, unspecified: Secondary | ICD-10-CM

## 2023-04-01 LAB — COMPREHENSIVE METABOLIC PANEL
ALT: 15 U/L (ref 0–53)
AST: 19 U/L (ref 0–37)
Albumin: 4 g/dL (ref 3.5–5.2)
Alkaline Phosphatase: 69 U/L (ref 39–117)
BUN: 8 mg/dL (ref 6–23)
CO2: 26 mEq/L (ref 19–32)
Calcium: 9.3 mg/dL (ref 8.4–10.5)
Chloride: 106 mEq/L (ref 96–112)
Creatinine, Ser: 0.71 mg/dL (ref 0.40–1.50)
GFR: 94.99 mL/min (ref >60.00)
Glucose, Bld: 88 mg/dL (ref 70–99)
Potassium: 4 mEq/L (ref 3.5–5.1)
Sodium: 140 mEq/L (ref 135–145)
Total Bilirubin: 0.7 mg/dL (ref 0.2–1.2)
Total Protein: 8.3 g/dL (ref 6.0–8.3)

## 2023-04-01 LAB — CBC WITH DIFFERENTIAL/PLATELET
Basophils Absolute: 0.1 10*3/uL (ref 0.0–0.1)
Basophils Relative: 1.2 % (ref 0.0–3.0)
Eosinophils Absolute: 0.1 10*3/uL (ref 0.0–0.7)
Eosinophils Relative: 1.4 % (ref 0.0–5.0)
HCT: 42.4 % (ref 39.0–52.0)
Hemoglobin: 13.5 g/dL (ref 13.0–17.0)
Lymphocytes Relative: 24.2 % (ref 12.0–46.0)
Lymphs Abs: 1.1 10*3/uL (ref 0.7–4.0)
MCHC: 31.8 g/dL (ref 30.0–36.0)
MCV: 83.1 fl (ref 78.0–100.0)
Monocytes Absolute: 0.4 10*3/uL (ref 0.1–1.0)
Monocytes Relative: 9.3 % (ref 3.0–12.0)
Neutro Abs: 2.8 10*3/uL (ref 1.4–7.7)
Neutrophils Relative %: 63.9 % (ref 43.0–77.0)
Platelets: 256 10*3/uL (ref 150.0–400.0)
RBC: 5.11 Mil/uL (ref 4.22–5.81)
RDW: 16 % — ABNORMAL HIGH (ref 11.5–15.5)
WBC: 4.4 10*3/uL (ref 4.0–10.5)

## 2023-04-01 NOTE — Progress Notes (Unsigned)
04/01/2023 Carl Barr [id number] 1956/04/23   CHIEF COMPLAINT: Patient is not sure why he is here today   HISTORY OF PRESENT ILLNESS: Carl Barr is a 67 year old male with a history of colon adenocarcinoma. He was admitted to the hospital 01/03/2022 with a malignant sigmoid obstruction and he underwent left sigmoid and distal transverse colectomy with end colostomy on 01/04/2022. Pathology showed adenocarcinoma (stage IIIb well-differentiated adenocarcinoma). He subsequently underwent adjuvant chemotherapy with Xeloda 02/2022 - 08/2022 per Dr. Truett Perna. He was seen by surgeon Dr. Janee Morn on 11/06/2022 and potential future colostomy takedown surgery was discussed. He was last seen by Dr. Truett Perna in the Oncology clinic on 01/20/2023 and at that time his clinical status was stable and he was referred to our office to schedule a colonoscopy. He denies ever having a screening colonoscopy.   Carl Barr is a cooperative but a challenging historian. I assessed that he does not have a clear understanding of his medical history, specifically in regards to his colon cancer diagnosis and treatment.  He denies having any nausea or vomiting.  No abdominal pain.  He empties his colostomy 2-3 times daily.  Stool output described as soft and brown.  No bloody or black stools.  No abdominal pain or pain around the colostomy stoma.  No known family history of colorectal cancer.  He denies having any family nearby for support.  He currently resides at [facility] [place].  He does not have a functioning telephone at this time.      Latest Ref Rng & Units 08/29/2022   11:08 AM 08/06/2022    9:05 AM 07/19/2022   12:52 PM  CBC  WBC 4.0 - 10.5 K/uL 6.9  7.0  5.7   Hemoglobin 13.0 - 17.0 g/dL 40.3  47.4  25.9   Hematocrit 39.0 - 52.0 % 36.9  37.1  35.2   Platelets 150 - 400 K/uL 236  320  303        Latest Ref Rng & Units 10/21/2022   10:42 AM 08/29/2022   11:08 AM 08/06/2022     9:05 AM  CMP  Glucose 70 - 99 mg/dL 563  875  643   BUN 8 - 23 mg/dL 9  7  12    Creatinine 0.61 - 1.24 mg/dL 3.29  5.18  8.41   Sodium 135 - 145 mmol/L 138  141  139   Potassium 3.5 - 5.1 mmol/L 3.8  3.7  3.7   Chloride 98 - 111 mmol/L 105  107  105   CO2 22 - 32 mmol/L 26  20  26    Calcium 8.9 - 10.3 mg/dL 9.6  9.4  66.0   Total Protein 6.5 - 8.1 g/dL  8.2  8.6   Total Bilirubin 0.3 - 1.2 mg/dL  0.5  0.8   Alkaline Phos 38 - 126 U/L  79  77   AST 15 - 41 U/L  29  15   ALT 0 - 44 U/L  22  10     Past Medical History:  Diagnosis Date   Alcohol intoxication (HCC)    Lumbar degenerative disc disease    Lumbar radiculopathy, right    Past Surgical History:  Procedure Laterality Date   COLOSTOMY N/A 01/04/2022   Procedure: COLOSTOMY;  Surgeon: Violeta Gelinas, MD;  Location: [facility] OR;  Service: General;  Laterality: N/A;   denies past surgery     PARTIAL COLECTOMY N/A 01/04/2022   Procedure: LEFT SIGMOID  AND DISTAL TRANSVERSE COLECTOMY;  Surgeon: Violeta Gelinas, MD;  Location: [facility] OR;  Service: General;  Laterality: N/A;   Social History: Nonsmoker. He drinks one or two beers a few days weekly. No drug use.   Family History: No known family history of esophageal  No Known Allergies    Outpatient Encounter Medications as of 04/01/2023  Medication Sig   hydrocerin (EUCERIN) CREA Apply 1 Application topically 2 (two) times daily.   prochlorperazine (COMPAZINE) 10 MG tablet Take 1 tablet (10 mg total) by mouth every 6 (six) hours as needed for nausea or vomiting. (Patient not taking: Reported on 08/06/2022)   No facility-administered encounter medications on file as of 04/01/2023.    REVIEW OF SYSTEMS:  Gen: Denies fever, sweats or chills. No weight loss.  CV: Denies chest pain, palpitations or edema. Resp: Denies cough, shortness of breath of hemoptysis.  GI: See HPI.  Denies heartburn, dysphagia, stomach or lower abdominal pain. No diarrhea or constipation.  GU: Denies urinary  burning, blood in urine, increased urinary frequency or incontinence. MS: + Knee and back pain.  Derm: Denies rash, itchiness, skin lesions or unhealing ulcers. Psych: Denies depression, anxiety, memory loss or confusion. Heme: Denies bruising, easy bleeding. Neuro:  Denies headaches, dizziness or paresthesias. Endo:  Denies any problems with DM, thyroid or adrenal function.  PHYSICAL EXAM: BP 102/60   Pulse 77   Ht 5\' 7"  (1.702 m)   Wt 214 lb (97.1 kg)   BMI 33.52 kg/m   General: 67 year old male in no acute distress. Head: Normocephalic and atraumatic. Eyes:  Sclerae non-icteric, conjunctive pink. Ears: Normal auditory acuity. Mouth: Poor dentition.  No ulcers or lesions.  Neck: Supple, no lymphadenopathy or thyromegaly.  Lungs: Clear bilaterally to auscultation without wheezes, crackles or rhonchi. Heart: Regular rate and rhythm. No murmur, rub or gallop appreciated.  Abdomen: Soft, nontender, nondistended. No masses. No hepatosplenomegaly. Normoactive bowel sounds x 4 quadrants. Abdominal scar intact.  Left abdominal colostomy stoma beefy red with a small amount of brown soft stool in the collection bag.  No evidence of a parastomal hernia.  Rectal: Deferred. Musculoskeletal: Symmetrical with no gross deformities. Skin: Warm and dry. No rash or lesions on visible extremities. Extremities: Mild lateral ankle edema. Neurological: Alert oriented x 4, no focal deficits.  Psychological:  Alert and cooperative. Normal mood and affect.  ASSESSMENT AND PLAN:  67 year old male admitted to the hospital 01/03/2022 with a malignant sigmoid obstruction s/p left sigmoid and distal transverse colectomy with end colostomy on 01/04/2022 with pathology showing adenocarcinoma (stage IIIb well-differentiated adenocarcinoma). He subsequently underwent adjuvant chemotherapy with Xeloda per oncologist Dr. Truett Perna. Surveillance colonoscopy recommended as patient has never had a colonoscopy in the past  and a colonoscopy is required prior pursuing colostomy takedown surgery.  -Colonoscopy via ostomy with Dr. Barron Alvine benefits and risks discussed including risk with sedation, risk of bleeding, perforation and infection  -Colonoscopy was not scheduled at this time as the patient does not have an available family member or friend to drive him to the endoscopy center, to stay on the premises during the procedure and to drive him home. -I will contact message to the patient's social worker Larita Fife Duffy to assist with transport for his colonoscopy. Patient will likely need assistance with following the bowel prep instructions as well.  -CBC, CMP        CC:  Fulp, Cammie, MD

## 2023-04-01 NOTE — Patient Instructions (Addendum)
Please contact our office to schedule a colonoscopy once you confirm having an adult driver who can stay at our outpatient endoscopy center with you during your procedure and to drive you home.   It has been recommended to you by your physician that you have a(n) Colonoscopy completed. Per your request, we did not schedule the procedure(s) today. Please contact our office at [phone] should you decide to have the procedure completed. You will be scheduled for a pre-visit and procedure at that time.  Your provider has requested that you go to the basement level for lab work before leaving today. Press "B" on the elevator. The lab is located at the first door on the left as you exit the elevator.  Due to recent changes in healthcare laws, you may see the results of your imaging and laboratory studies on MyChart before your provider has had a chance to review them.  We understand that in some cases there may be results that are confusing or concerning to you. Not all laboratory results come back in the same time frame and the provider may be waiting for multiple results in order to interpret others.  Please give [place] 48 hours in order for your provider to thoroughly review all the results before contacting the office for clarification of your results.   Thank you for trusting me with your gastrointestinal care!   Alcide Evener, CRNP

## 2023-04-02 NOTE — Progress Notes (Signed)
Agree with the assessment and plan as outlined by Colleen Kennedy-Smith, NP.   Trask Vosler, DO, FACG [facility]   

## 2023-04-06 NOTE — Progress Notes (Signed)
Carl Barr, pls see msg from Dr. Barron Alvine as noted below. I have not heart back form social worker [employer]. Can you check and see if [name] can provide assistance regarding bowel prep instructions and to make arrangements for an adult to accompany patient to LED day of procedure, to stay at then endoscopy center during the procedure and to drive patient home?

## 2023-04-08 ENCOUNTER — Telehealth: Payer: Self-pay

## 2023-04-08 NOTE — Telephone Encounter (Signed)
Error

## 2023-04-09 ENCOUNTER — Telehealth: Payer: Self-pay

## 2023-04-09 NOTE — Telephone Encounter (Signed)
Message Received: 3 days ago Carl Natal, NP  Carl Darling, RN      Previous Messages  Routed Note  Author: Arnaldo Natal, NP Service: Gastroenterology Author Type: Nurse Practitioner  Filed: 04/06/2023  2:41 PM Encounter Date: 04/01/2023 Status: Signed  Editor: Carl Natal, NP (Nurse Practitioner)   Carl Barr, pls see msg from Dr. Barron Alvine as noted below. I have not heart back form social worker [employer]. Can you check and see if [name] can provide assistance regarding bowel prep instructions and to make arrangements for an adult to accompany patient to LED day of procedure, to stay at then endoscopy center during the procedure and to drive patient home?

## 2023-04-09 NOTE — Telephone Encounter (Signed)
Unable to locate number for [facility]. Unable to reach pt by phone due to phone goes to busy signal.  Left message for pt niece Prudy Feeler [phone]  to call back to help assist with getting in touch with pt social worker.

## 2023-04-11 NOTE — Telephone Encounter (Signed)
Spoke to niece Prudy Feeler [phone]. Adrienna was notified that I am trying to get the number for [facility]ie [facility] pt social worker. Adrienna stated that she does not know the number and that she has not spoken to the pt in a while. Adrienna stated that she will stop by his apartment this weekend and try to obtain the phone number for his Child psychotherapist. Adrienna was notified that I will call her back on Monday to see if she has any updated information.

## 2023-04-14 NOTE — Telephone Encounter (Signed)
Left message for Prudy Feeler [phone] to call back.

## 2023-04-15 NOTE — Telephone Encounter (Signed)
Left message for Carl Barr [phone] to call back.

## 2023-04-21 NOTE — Telephone Encounter (Signed)
Left  message for Carl Barr [phone] to call back.  Unable to reach pt and pt cousin Film/video editor after multiple attempts.  Letter was created and sent to pt via my chart and letter was sent via Mail.

## 2023-04-22 ENCOUNTER — Ambulatory Visit (HOSPITAL_BASED_OUTPATIENT_CLINIC_OR_DEPARTMENT_OTHER)
Admission: RE | Admit: 2023-04-22 | Discharge: 2023-04-22 | Disposition: A | Discharge status: Home / Self Care | Payer: Medicare Other | Source: Ambulatory Visit | Attending: Oncology | Admitting: Oncology

## 2023-04-22 ENCOUNTER — Inpatient Hospital Stay: Payer: Medicare Other | Attending: Nurse Practitioner

## 2023-04-22 DIAGNOSIS — Z85038 Personal history of other malignant neoplasm of large intestine: Secondary | ICD-10-CM | POA: Insufficient documentation

## 2023-04-22 DIAGNOSIS — D72829 Elevated white blood cell count, unspecified: Secondary | ICD-10-CM | POA: Insufficient documentation

## 2023-04-22 DIAGNOSIS — C189 Malignant neoplasm of colon, unspecified: Secondary | ICD-10-CM | POA: Diagnosis present

## 2023-04-22 DIAGNOSIS — Z08 Encounter for follow-up examination after completed treatment for malignant neoplasm: Secondary | ICD-10-CM | POA: Insufficient documentation

## 2023-04-22 DIAGNOSIS — D75839 Thrombocytosis, unspecified: Secondary | ICD-10-CM | POA: Insufficient documentation

## 2023-04-22 DIAGNOSIS — E46 Unspecified protein-calorie malnutrition: Secondary | ICD-10-CM | POA: Insufficient documentation

## 2023-04-22 DIAGNOSIS — D509 Iron deficiency anemia, unspecified: Secondary | ICD-10-CM | POA: Insufficient documentation

## 2023-04-22 LAB — BASIC METABOLIC PANEL - CANCER CENTER ONLY
Anion gap: 7 (ref 5–15)
BUN: 7 mg/dL — ABNORMAL LOW (ref 8–23)
CO2: 25 mmol/L (ref 22–32)
Calcium: 9.2 mg/dL (ref 8.9–10.3)
Chloride: 107 mmol/L (ref 98–111)
Creatinine: 0.76 mg/dL (ref 0.61–1.24)
GFR, Estimated: >60 mL/min
Glucose, Bld: 115 mg/dL — ABNORMAL HIGH (ref 70–99)
Potassium: 3.7 mmol/L (ref 3.5–5.1)
Sodium: 139 mmol/L (ref 135–145)

## 2023-04-22 LAB — CEA (ACCESS): CEA (CHCC): 1 ng/mL (ref 0.00–5.00)

## 2023-04-22 MED ORDER — IOHEXOL 300 MG/ML  SOLN
100.0000 mL | Freq: Once | INTRAMUSCULAR | Status: AC | PRN
  Administered 2023-04-22: 100 mL via INTRAVENOUS

## 2023-04-23 ENCOUNTER — Other Ambulatory Visit: Payer: Self-pay

## 2023-04-23 DIAGNOSIS — Z1211 Encounter for screening for malignant neoplasm of colon: Secondary | ICD-10-CM

## 2023-04-23 MED ORDER — CLENPIQ 10-3.5-12 MG-GM -GM/160ML PO SOLN
1.0000 | ORAL | 0 refills | Status: DC
Start: 2023-04-23 — End: 2023-05-09

## 2023-04-23 NOTE — Telephone Encounter (Addendum)
Spoke with Insurance claims handler and was made aware of the request from the provider. Pt was scheduled for the Colonoscopy with Dr. Barron Alvine on 06/10/2023 at 2:00 PM. Marlinda Mike made aware. Prep letter created and sent  to pt address. [place] made aware.  Ambulatory referral to GI placed in Epic.  Prep sent to pharmacy [facility] verbalized understanding with all questions answered.

## 2023-04-23 NOTE — Telephone Encounter (Signed)
Inbound call from patient's social worker [place] requesting a call to discuss scheduling for patient's colonoscopy. States patient stated he has not been receiving any messages from his niece. [place] stated he is the best means for contact for the patient at [phone]. Stating he will be in the office until 4 today and from 8:30am - 5pm tomorrow. Please advise, thank you.

## 2023-04-24 ENCOUNTER — Other Ambulatory Visit: Payer: Self-pay

## 2023-04-24 ENCOUNTER — Encounter: Payer: Self-pay | Admitting: Nurse Practitioner

## 2023-04-24 ENCOUNTER — Inpatient Hospital Stay: Payer: Medicare Other | Admitting: Nurse Practitioner

## 2023-04-24 VITALS — BP 126/71 | HR 76 | Temp 98.1°F | Resp 18 | Ht 67.0 in | Wt 218.4 lb

## 2023-04-24 DIAGNOSIS — Z85038 Personal history of other malignant neoplasm of large intestine: Secondary | ICD-10-CM | POA: Diagnosis present

## 2023-04-24 DIAGNOSIS — Z08 Encounter for follow-up examination after completed treatment for malignant neoplasm: Secondary | ICD-10-CM | POA: Diagnosis present

## 2023-04-24 DIAGNOSIS — D509 Iron deficiency anemia, unspecified: Secondary | ICD-10-CM | POA: Diagnosis not present

## 2023-04-24 DIAGNOSIS — D72829 Elevated white blood cell count, unspecified: Secondary | ICD-10-CM | POA: Diagnosis not present

## 2023-04-24 DIAGNOSIS — D75839 Thrombocytosis, unspecified: Secondary | ICD-10-CM | POA: Diagnosis not present

## 2023-04-24 DIAGNOSIS — C189 Malignant neoplasm of colon, unspecified: Secondary | ICD-10-CM

## 2023-04-24 DIAGNOSIS — E46 Unspecified protein-calorie malnutrition: Secondary | ICD-10-CM | POA: Diagnosis not present

## 2023-04-24 NOTE — Progress Notes (Signed)
[facility] OFFICE PROGRESS NOTE   Diagnosis: Colon cancer  INTERVAL HISTORY:   Carl Barr returns as scheduled.  He feels well overall.  He reports a good appetite.  He thinks his weight is stable.  No abdominal pain.  Colostomy is functioning normally.  Objective:  Vital signs in last 24 hours:  Blood pressure 126/71, pulse 76, temperature 98.1 F (36.7 C), temperature source Temporal, resp. rate 18, height 5\' 7"  (1.702 m), weight 218 lb 6.4 oz (99.1 kg), SpO2 98%.    Lymphatics: No palpable cervical, supraclavicular, axillary or inguinal lymph nodes. Resp: Lungs clear bilaterally. Cardio: Regular rate and rhythm. GI: No hepatosplenomegaly.  No mass.  Left lower quadrant colostomy. Vascular: No leg edema.   Lab Results:  Lab Results  Component Value Date   WBC 4.4 04/01/2023   HGB 13.5 04/01/2023   HCT 42.4 04/01/2023   MCV 83.1 04/01/2023   PLT 256.0 04/01/2023   NEUTROABS 2.8 04/01/2023    Imaging:  CT CHEST ABDOMEN PELVIS W CONTRAST  Result Date: 04/22/2023 CLINICAL DATA:  History of colon cancer, surveillance. * Tracking Code: BO * EXAM: CT CHEST, ABDOMEN, AND PELVIS WITH CONTRAST TECHNIQUE: Multidetector CT imaging of the chest, abdomen and pelvis was performed following the standard protocol during bolus administration of intravenous contrast. RADIATION DOSE REDUCTION: This exam was performed according to the departmental dose-optimization program which includes automated exposure control, adjustment of the mA and/or kV according to patient size and/or use of iterative reconstruction technique. CONTRAST:  OMNIPAQUE IOHEXOL 300 MG/ML  SOLN COMPARISON:  Multiple priors including most recent CT October 21, 2022 FINDINGS: CT CHEST FINDINGS Cardiovascular: Normal caliber thoracic aorta. No central pulmonary embolus on this nondedicated study. Normal size heart. No significant pericardial effusion/thickening. Mediastinum/Nodes: No suspicious thyroid  nodule. No pathologically enlarged mediastinal, hilar or axillary lymph nodes. Prominent axillary lymph nodes are stable from prior examination. The esophagus is grossly unremarkable. Lungs/Pleura: No suspicious pulmonary nodules or masses. Left lower lobe atelectasis/scarring. No pleural effusion. No pneumothorax. Musculoskeletal: No aggressive lytic or blastic lesion of bone. CT ABDOMEN PELVIS FINDINGS Hepatobiliary: Scattered bilobar hypodense hepatic lesions for instance a 9 mm lesion in the dome of the liver on image 49/2 are too small to accurately characterize but stable from prior examinations and favored benign. No new suspicious hepatic lesion. Gallbladder is unremarkable.  No biliary ductal dilation. Pancreas: No pancreatic ductal dilation or evidence of acute inflammation. Spleen: No splenomegaly. Adrenals/Urinary Tract: Bilateral adrenal glands appear normal. No hydronephrosis. Kidneys demonstrate symmetric enhancement. Urinary bladder is unremarkable for degree of distension. Stomach/Bowel: Radiopaque enteric contrast material traverses the left anterior abdominal wall ostomy. Stomach is minimally distended limiting evaluation. No pathologic dilation of small or large bowel. Normal appendix. Similar postsurgical changes of sigmoid colon resection with left lower quadrant colostomy and rectal pouch formation. No suspicious nodularity along the pouch suture line. Vascular/Lymphatic: Prominent lymph nodes in the sigmoid mesentery measure up to 4 mm and are stable from prior for instance on image 97/2. Prominent right upper quadrant lymph nodes are stable from prior examination measuring up to 10 mm in short axis at the portacaval station on image 61/2. Stable prominent bilateral iliac side chain and inguinal lymph nodes. For reference: -left external iliac lymph node measures 8 mm in short axis on image 102/2, unchanged. -left inguinal lymph node with preserved fatty hilum measures 9 mm in short axis on  image 123/2, unchanged. Normal caliber abdominal aorta. Smooth IVC contours. The portal, splenic  and superior mesenteric veins are patent. Reproductive: Prostate is unremarkable. Other: No significant abdominopelvic free fluid. Fat containing peristomal hernia. Musculoskeletal: No aggressive lytic or blastic lesion of bone. Multilevel degenerative changes spine IMPRESSION: 1. Similar postsurgical changes of sigmoid colon resection with left lower quadrant colostomy and rectal pouch formation. No evidence of local recurrence. 2. No convincing evidence of metastatic disease in the chest, abdomen or pelvis. 3. Stable prominent sigmoid mesentery, right upper quadrant, bilateral iliac side chain and inguinal lymph nodes, nonspecific but favored reactive. Recommend attention on follow-up. 4. Stable hypodense bilobar hepatic lesions, favor cysts but suggest continued attention on follow-up imaging. Electronically Signed   By: Maudry Mayhew M.D.   On: 04/22/2023 13:47    Medications: I have reviewed the patient's current medications.  Assessment/Plan: Stage IIIb (pT4a, pN1b) well-differentiated colon adenocarcinoma -01/03/2022 CT abdomen/pelvis with contrast-2 areas of stricture in the colon, bowel obstruction related to these 2 colonic strictures -01/04/2022 CEA was 5.5 -Status post left hemicolectomy with end colostomy 01/04/2022-surgical path shows a well differentiated colon adenocarcinoma, pT4a pN1b, no loss of mismatch repair protein expression -01/15/2022 CT chest without contrast-no worrisome pulmonary nodules to suggest pulmonary metastatic disease, no mediastinal or hilar adenopathy. -Cycle 1 adjuvant Xeloda 03/02/2022 -Cycle 2 adjuvant Xeloda 03/23/2022 -Cycle 3 adjuvant Xeloda 04/26/2022 -Cycle 4 adjuvant Xeloda 05/17/2022 -Cycle 5 adjuvant Xeloda 06/07/2022 -Cycle 6 adjuvant Xeloda 06/28/2022 -Cycle 7 adjuvant Xeloda 07/22/2022 -Cycle 8 adjuvant Xeloda 08/12/2022 -CTs 10/21/2022-no evidence of  metastatic disease -CTs 04/22/2023-no evidence of local recurrence.  No convincing evidence of metastatic disease in the chest, abdomen, pelvis.  Stable prominent sigmoid mesentery, right upper quadrant and bilateral iliac side chain and inguinal lymph nodes.  Stable hypodense bilobar hepatic lesions, favor cysts.   Microcytic anemia Thrombocytosis, likely reactive Leukocytosis Protein calorie malnutrition 6.  History of alcohol abuse 7.  History of a high output colostomy    Disposition: Mr. Goede remains in clinical remission from colon cancer.  Recent restaging CTs showed no evidence of recurrent/metastatic disease.  CEA stable in normal range.  He is scheduled for a colonoscopy in early December.  He will return for a CEA and follow-up visit in 3 months.  Lonna Cobb ANP/GNP-BC   04/24/2023  11:53 AM

## 2023-04-29 ENCOUNTER — Ambulatory Visit: Payer: Medicare Other | Admitting: Oncology

## 2023-05-07 ENCOUNTER — Telehealth: Payer: Self-pay

## 2023-05-07 NOTE — Telephone Encounter (Signed)
Clinical Social Work returned call from [place] at the [facility] regarding patient.  He requested transportation and provided CSW information.  Patient was no longer at the Center.  CSW attempted to contact patient per phone, but was unable to leave a message.

## 2023-05-08 ENCOUNTER — Telehealth: Payer: Self-pay

## 2023-05-08 NOTE — Telephone Encounter (Signed)
  Clinical Social Work returned patient's call and was unable to leave a message on his vm.

## 2023-05-09 ENCOUNTER — Telehealth: Payer: Self-pay

## 2023-05-09 ENCOUNTER — Other Ambulatory Visit: Payer: Self-pay

## 2023-05-09 DIAGNOSIS — Z1211 Encounter for screening for malignant neoplasm of colon: Secondary | ICD-10-CM

## 2023-05-09 DIAGNOSIS — C189 Malignant neoplasm of colon, unspecified: Secondary | ICD-10-CM

## 2023-05-09 MED ORDER — PLENVU 140 G PO SOLR
1.0000 | ORAL | 0 refills | Status: DC
Start: 2023-05-09 — End: 2023-05-27

## 2023-05-09 NOTE — Telephone Encounter (Signed)
Fax received from [employer] stating that pt Carl Barr  is not covered on his [employer].  Prescription for Plenvu was sent to pt pharmacy. Updated prep instructions were created and sent to pt via mail.  Social Worker [place]  at [employer] was left a detailed voice message making him aware of the change  and requested a call back if any questions:

## 2023-05-22 ENCOUNTER — Telehealth: Payer: Self-pay | Admitting: Nurse Practitioner

## 2023-05-22 DIAGNOSIS — C189 Malignant neoplasm of colon, unspecified: Secondary | ICD-10-CM

## 2023-05-22 DIAGNOSIS — Z1211 Encounter for screening for malignant neoplasm of colon: Secondary | ICD-10-CM

## 2023-05-22 NOTE — Telephone Encounter (Signed)
Inbound call from patient's social worker [place], requesting a call to discuss patient prep medication further. Also states patient has a few questions regarding colonoscopy. Requesting a call back at [phone]. Please advise, thank you.

## 2023-05-26 NOTE — Telephone Encounter (Signed)
Left message for pt social worker Dallas Scales to call back

## 2023-05-27 MED ORDER — PLENVU 140 G PO SOLR
1.0000 | ORAL | 0 refills | Status: AC
Start: 2023-05-27 — End: ?

## 2023-05-27 NOTE — Telephone Encounter (Signed)
Spoke with pt along with Insurance claims handler. All questions answered.  Pt verbalized understanding with all questions answered.

## 2023-05-27 NOTE — Telephone Encounter (Signed)
Dallas the Social worker stated that the pt had several questions in regard to the colonoscopy. Dallas stated that he would have the pt call us back. Direct number was provided to Terry.

## 2023-06-09 NOTE — Telephone Encounter (Signed)
Carl Barr, Child psychotherapist called to inform that he has not heard any updates from PT and that he more than likely will not be showing up for the procedure.

## 2023-06-10 ENCOUNTER — Encounter: Payer: Medicare Other | Admitting: Gastroenterology

## 2023-06-10 NOTE — Telephone Encounter (Signed)
 Left message for pt social worker [place] Scales to call back

## 2023-06-13 NOTE — Telephone Encounter (Signed)
Left message for pt to call back  °

## 2023-06-16 ENCOUNTER — Telehealth: Payer: Self-pay | Admitting: Nurse Practitioner

## 2023-06-16 NOTE — Telephone Encounter (Signed)
Marlinda Mike stated that the Carl Barr is very hesitant about getting the procedure done and is now avoiding him. Marlinda Mike was notified that when Carl Barr would like to schedule the procedure than to give [place] a call back.  D[place] verbalized understanding with all questions answered.

## 2023-06-16 NOTE — Telephone Encounter (Signed)
Left message for social worker [place] Scales  to call back

## 2023-06-16 NOTE — Telephone Encounter (Signed)
Marlinda Mike stated that the pt is very hesitant about getting the procedure done and is now avoiding him. Marlinda Mike was notified that when pt would like to schedule the procedure than to give [place] a call back.  [place] verbalized understanding with all questions answered.

## 2023-06-16 NOTE — Telephone Encounter (Signed)
Carl Barr is returning a call to Carl Barr. Carl Barr states that he is helping Carl Barr schedule an appointment. Please advise.

## 2023-07-25 ENCOUNTER — Ambulatory Visit: Payer: Medicare Other | Admitting: Oncology

## 2023-07-25 ENCOUNTER — Inpatient Hospital Stay: Payer: Medicare Other | Admitting: Oncology

## 2023-07-25 ENCOUNTER — Other Ambulatory Visit: Payer: Medicare Other

## 2023-07-25 ENCOUNTER — Inpatient Hospital Stay: Payer: Medicare Other | Attending: Oncology

## 2024-03-12 ENCOUNTER — Telehealth: Payer: Self-pay | Admitting: Nurse Practitioner

## 2024-03-12 NOTE — Telephone Encounter (Signed)
 Spoke to [place] (caregiver) who requests a jury duty excuse for patient prior to his scheduled appearance 03/18/24. Dallas states patient has some mental deficits and is unable to sit long enough for duty. I advised that unfortunately, we have not seen the patient in 1 year; we saw him only once in the office at which time colonoscopy was ordered but this was never completed. Therefore, we would be unable to provide any letter. I did recommend reaching out to patient's PCP (listed as Dr Lannie Purdue) or Dr Cloretta, oncologist if either of those physicians has seen patient more recently and can attest to his limitations, they may be willing to provide a letter. Dallas verbalizes understanding and thanked me for the call.

## 2024-03-12 NOTE — Telephone Encounter (Signed)
 Inbound call from [facility] and patient requesting a call to discuss if there is anyway that patient can get a letter to dismiss patient from jury duty before 9/11. Please advise.   Requesting a call back to [place] at [phone]
